# Patient Record
Sex: Male | Born: 1950 | Race: Black or African American | Hispanic: No | Marital: Single | Smoking: Former smoker
Health system: Southern US, Community
[De-identification: ages and names within clinical notes are randomized; demographics above are authoritative.]

---

## 2020-03-08 ENCOUNTER — Emergency Department (HOSPITAL_BASED_OUTPATIENT_CLINIC_OR_DEPARTMENT_OTHER): Payer: Medicare Other

## 2020-03-08 ENCOUNTER — Inpatient Hospital Stay (HOSPITAL_BASED_OUTPATIENT_CLINIC_OR_DEPARTMENT_OTHER)
Admission: EM | Admit: 2020-03-08 | Discharge: 2020-04-02 | DRG: 177 | Disposition: A | Discharge status: Skilled Nursing Facility | Payer: Medicare Other | Source: Other Acute Inpatient Hospital | Attending: Internal Medicine | Admitting: Internal Medicine

## 2020-03-08 ENCOUNTER — Encounter (HOSPITAL_BASED_OUTPATIENT_CLINIC_OR_DEPARTMENT_OTHER): Payer: Self-pay | Admitting: Emergency Medicine

## 2020-03-08 ENCOUNTER — Other Ambulatory Visit: Payer: Self-pay

## 2020-03-08 DIAGNOSIS — I951 Orthostatic hypotension: Secondary | ICD-10-CM | POA: Diagnosis not present

## 2020-03-08 DIAGNOSIS — D72829 Elevated white blood cell count, unspecified: Secondary | ICD-10-CM | POA: Diagnosis not present

## 2020-03-08 DIAGNOSIS — R748 Abnormal levels of other serum enzymes: Secondary | ICD-10-CM | POA: Diagnosis present

## 2020-03-08 DIAGNOSIS — Z6831 Body mass index (BMI) 31.0-31.9, adult: Secondary | ICD-10-CM

## 2020-03-08 DIAGNOSIS — E669 Obesity, unspecified: Secondary | ICD-10-CM | POA: Diagnosis present

## 2020-03-08 DIAGNOSIS — T380X5A Adverse effect of glucocorticoids and synthetic analogues, initial encounter: Secondary | ICD-10-CM | POA: Diagnosis not present

## 2020-03-08 DIAGNOSIS — Z87891 Personal history of nicotine dependence: Secondary | ICD-10-CM

## 2020-03-08 DIAGNOSIS — D62 Acute posthemorrhagic anemia: Secondary | ICD-10-CM | POA: Diagnosis not present

## 2020-03-08 DIAGNOSIS — R7989 Other specified abnormal findings of blood chemistry: Secondary | ICD-10-CM | POA: Diagnosis present

## 2020-03-08 DIAGNOSIS — J1282 Pneumonia due to coronavirus disease 2019: Secondary | ICD-10-CM

## 2020-03-08 DIAGNOSIS — R0602 Shortness of breath: Secondary | ICD-10-CM

## 2020-03-08 DIAGNOSIS — Z23 Encounter for immunization: Secondary | ICD-10-CM

## 2020-03-08 DIAGNOSIS — R1310 Dysphagia, unspecified: Secondary | ICD-10-CM | POA: Diagnosis not present

## 2020-03-08 DIAGNOSIS — U071 COVID-19: Secondary | ICD-10-CM | POA: Diagnosis not present

## 2020-03-08 DIAGNOSIS — E860 Dehydration: Secondary | ICD-10-CM | POA: Diagnosis present

## 2020-03-08 DIAGNOSIS — K921 Melena: Secondary | ICD-10-CM | POA: Diagnosis not present

## 2020-03-08 DIAGNOSIS — E875 Hyperkalemia: Secondary | ICD-10-CM | POA: Diagnosis not present

## 2020-03-08 DIAGNOSIS — E876 Hypokalemia: Secondary | ICD-10-CM | POA: Diagnosis not present

## 2020-03-08 DIAGNOSIS — Z9119 Patient's noncompliance with other medical treatment and regimen: Secondary | ICD-10-CM

## 2020-03-08 DIAGNOSIS — D75839 Thrombocytosis, unspecified: Secondary | ICD-10-CM | POA: Diagnosis present

## 2020-03-08 DIAGNOSIS — J9601 Acute respiratory failure with hypoxia: Secondary | ICD-10-CM

## 2020-03-08 DIAGNOSIS — R0902 Hypoxemia: Secondary | ICD-10-CM

## 2020-03-08 DIAGNOSIS — I712 Thoracic aortic aneurysm, without rupture: Secondary | ICD-10-CM | POA: Diagnosis present

## 2020-03-08 DIAGNOSIS — R197 Diarrhea, unspecified: Secondary | ICD-10-CM | POA: Diagnosis not present

## 2020-03-08 DIAGNOSIS — W19XXXA Unspecified fall, initial encounter: Secondary | ICD-10-CM

## 2020-03-08 LAB — COMPREHENSIVE METABOLIC PANEL
ALT: 75 U/L — ABNORMAL HIGH (ref 0–44)
AST: 55 U/L — ABNORMAL HIGH (ref 15–41)
Albumin: 2.1 g/dL — ABNORMAL LOW (ref 3.5–5.0)
Alkaline Phosphatase: 113 U/L (ref 38–126)
Anion gap: 12 (ref 5–15)
BUN: 12 mg/dL (ref 8–23)
CO2: 25 mmol/L (ref 22–32)
Calcium: 7.9 mg/dL — ABNORMAL LOW (ref 8.9–10.3)
Chloride: 96 mmol/L — ABNORMAL LOW (ref 98–111)
Creatinine, Ser: 1.03 mg/dL (ref 0.61–1.24)
GFR calc Af Amer: >60 mL/min (ref >60)
GFR calc non Af Amer: >60 mL/min (ref >60)
Glucose, Bld: 176 mg/dL — ABNORMAL HIGH (ref 70–99)
Potassium: 3.8 mmol/L (ref 3.5–5.1)
Sodium: 133 mmol/L — ABNORMAL LOW (ref 135–145)
Total Bilirubin: 1 mg/dL (ref 0.3–1.2)
Total Protein: 7.1 g/dL (ref 6.5–8.1)

## 2020-03-08 LAB — LACTIC ACID, PLASMA
Lactic Acid, Venous: 1.7 mmol/L (ref 0.5–1.9)
Lactic Acid, Venous: 1.4 mmol/L (ref 0.5–1.9)

## 2020-03-08 LAB — CBC WITH DIFFERENTIAL/PLATELET
Abs Immature Granulocytes: 0.53 10*3/uL — ABNORMAL HIGH (ref 0.00–0.07)
Basophils Absolute: 0.1 10*3/uL (ref 0.0–0.1)
Basophils Relative: 0 %
Eosinophils Absolute: 0.1 10*3/uL (ref 0.0–0.5)
Eosinophils Relative: 0 %
HCT: 36.3 % — ABNORMAL LOW (ref 39.0–52.0)
Hemoglobin: 12.2 g/dL — ABNORMAL LOW (ref 13.0–17.0)
Immature Granulocytes: 3 %
Lymphocytes Relative: 4 %
Lymphs Abs: 0.8 10*3/uL (ref 0.7–4.0)
MCH: 31.5 pg (ref 26.0–34.0)
MCHC: 33.6 g/dL (ref 30.0–36.0)
MCV: 93.8 fL (ref 80.0–100.0)
Monocytes Absolute: 1 10*3/uL (ref 0.1–1.0)
Monocytes Relative: 6 %
Neutro Abs: 15.7 10*3/uL — ABNORMAL HIGH (ref 1.7–7.7)
Neutrophils Relative %: 87 %
Platelets: 509 10*3/uL — ABNORMAL HIGH (ref 150–400)
RBC: 3.87 MIL/uL — ABNORMAL LOW (ref 4.22–5.81)
RDW: 13.5 % (ref 11.5–15.5)
WBC: 18.2 10*3/uL — ABNORMAL HIGH (ref 4.0–10.5)
nRBC: 0.2 % (ref 0.0–0.2)

## 2020-03-08 LAB — FERRITIN: Ferritin: 1305 ng/mL — ABNORMAL HIGH (ref 24–336)

## 2020-03-08 LAB — TROPONIN I (HIGH SENSITIVITY)
Troponin I (High Sensitivity): 7 ng/L (ref <18)
Troponin I (High Sensitivity): 8 ng/L (ref <18)

## 2020-03-08 LAB — RESPIRATORY PANEL BY RT PCR (FLU A&B, COVID)
Influenza A by PCR: NEGATIVE
Influenza B by PCR: NEGATIVE
SARS Coronavirus 2 by RT PCR: POSITIVE — AB

## 2020-03-08 LAB — D-DIMER, QUANTITATIVE: D-Dimer, Quant: 8.73 ug/mL-FEU — ABNORMAL HIGH (ref 0.00–0.50)

## 2020-03-08 LAB — TRIGLYCERIDES: Triglycerides: 139 mg/dL (ref <150)

## 2020-03-08 LAB — C-REACTIVE PROTEIN: CRP: 30.9 mg/dL — ABNORMAL HIGH (ref <1.0)

## 2020-03-08 LAB — PROCALCITONIN: Procalcitonin: 0.19 ng/mL

## 2020-03-08 LAB — LACTATE DEHYDROGENASE: LDH: 425 U/L — ABNORMAL HIGH (ref 98–192)

## 2020-03-08 LAB — BRAIN NATRIURETIC PEPTIDE: B Natriuretic Peptide: 50.1 pg/mL (ref 0.0–100.0)

## 2020-03-08 MED ORDER — IOHEXOL 350 MG/ML SOLN
100.0000 mL | Freq: Once | INTRAVENOUS | Status: AC | PRN
  Administered 2020-03-08: 100 mL via INTRAVENOUS

## 2020-03-08 MED ORDER — ALBUTEROL SULFATE HFA 108 (90 BASE) MCG/ACT IN AERS
2.0000 | INHALATION_SPRAY | RESPIRATORY_TRACT | Status: DC | PRN
  Administered 2020-03-12 – 2020-03-24 (×4): 2 via RESPIRATORY_TRACT
  Filled 2020-03-08: qty 6.7

## 2020-03-08 MED ORDER — METHYLPREDNISOLONE SODIUM SUCC 125 MG IJ SOLR
80.0000 mg | Freq: Two times a day (BID) | INTRAMUSCULAR | Status: DC
  Administered 2020-03-09 – 2020-03-18 (×18): 80 mg via INTRAVENOUS
  Filled 2020-03-08 (×18): qty 2

## 2020-03-08 MED ORDER — SODIUM CHLORIDE 0.9 % IV SOLN
1.0000 g | INTRAVENOUS | Status: AC
  Administered 2020-03-08 – 2020-03-12 (×5): 1 g via INTRAVENOUS
  Filled 2020-03-08 (×5): qty 10

## 2020-03-08 MED ORDER — SODIUM CHLORIDE 0.9 % IV SOLN
500.0000 mg | INTRAVENOUS | Status: AC
  Administered 2020-03-08 – 2020-03-12 (×4): 500 mg via INTRAVENOUS
  Filled 2020-03-08 (×5): qty 500

## 2020-03-08 MED ORDER — ENOXAPARIN SODIUM 60 MG/0.6ML ~~LOC~~ SOLN
50.0000 mg | Freq: Two times a day (BID) | SUBCUTANEOUS | Status: DC
  Administered 2020-03-08 – 2020-03-09 (×2): 50 mg via SUBCUTANEOUS
  Filled 2020-03-08 (×2): qty 0.6

## 2020-03-08 MED ORDER — BARICITINIB 2 MG PO TABS
4.0000 mg | ORAL_TABLET | Freq: Every day | ORAL | Status: AC
  Administered 2020-03-08 – 2020-03-21 (×14): 4 mg via ORAL
  Filled 2020-03-08 (×14): qty 2

## 2020-03-08 MED ORDER — METHYLPREDNISOLONE SODIUM SUCC 125 MG IJ SOLR
50.0000 mg | Freq: Two times a day (BID) | INTRAMUSCULAR | Status: DC
  Administered 2020-03-08: 50 mg via INTRAVENOUS
  Filled 2020-03-08: qty 2

## 2020-03-08 MED ORDER — SODIUM CHLORIDE 0.9 % IV SOLN
100.0000 mg | Freq: Once | INTRAVENOUS | Status: AC
  Administered 2020-03-08: 100 mg via INTRAVENOUS
  Filled 2020-03-08: qty 20

## 2020-03-08 MED ORDER — SODIUM CHLORIDE 0.9 % IV SOLN
100.0000 mg | Freq: Every day | INTRAVENOUS | Status: AC
  Administered 2020-03-09 – 2020-03-12 (×4): 100 mg via INTRAVENOUS
  Filled 2020-03-08 (×4): qty 20

## 2020-03-08 NOTE — ED Notes (Signed)
Pt on monitor 

## 2020-03-08 NOTE — ED Triage Notes (Signed)
Per EMS:  Sob x few days, cough, sore throat.  Pulse ox 56 on arrival, 98 on 2L.  Lungs clear.

## 2020-03-08 NOTE — ED Triage Notes (Signed)
Pt started having cold symptoms and decreased appetite 1 week ago.  Increased SOB for the past couple days.

## 2020-03-08 NOTE — ED Provider Notes (Signed)
  Physical Exam  BP 128/76   Pulse (!) 107   Temp 98.6 F (37 C) (Oral)   Resp (!) 24   Ht 5\' 11"  (1.803 m)   Wt 101.6 kg   SpO2 93%   BMI 31.24 kg/m   Physical Exam  ED Course/Procedures   Clinical Course as of Mar 09 1639  Wed Mar 08, 2020  1150 Reviewed cxr - obvious covid pna  DG Chest Port 1 View [RD]  1317 Will check cta  D-dimer, quantitative(!) [RD]  1419 Down to 5L [place]   [RD]    Clinical Course User Index [RD] Mar 10, 2020, MD    Procedures  MDM  Care assumed at 3 PM.  Patient has symptoms for a week and tested positive for Covid.  His D-dimer is very elevated at 8.  Signout pending CTA and admission.  Patient initially was on high flow nasal cannula and titrated down to 3 to 5 L with good oxygen saturations.  4:41 PM CT showed focal pneumonia as well as thoracic aneurysm with no obvious rupture.  I talked to Dr. Milagros Loll from hospitalist.  He recommend IV Solu-Medrol 80 mg twice daily.  Also recommend Lovenox 0.5 mg twice daily.  Patient will continue remdesivir as well.  Will observe closely.      Jarvis Newcomer, MD 03/08/20 [phone]

## 2020-03-08 NOTE — ED Provider Notes (Signed)
[facility] EMERGENCY DEPARTMENT Provider Note   CSN: [id number] Arrival date & time: 03/08/20  1046     History Chief Complaint  Patient presents with  . Shortness of Breath     Tyler Jackson is a 69 y.o. male.  Presented to ER with concern for shortness of breath.  Patient reports that he had cold-like symptoms for the past week, started with generalized fatigue, decreased appetite.  However the past couple days has noted steadily increasing shortness of breath.  Also having increased cough, sore throat.  Cough is nonproductive, nonbloody.  No associated chest pain.  Denies any chronic medical problems.  Denies smoking history.  Did not get Covid vaccine.  History limited due to acuity  HPI     History reviewed. No pertinent past medical history.  There are no problems to display for this patient.   No family history on file.  Social History   Tobacco Use  . Smoking status: Former Smoker    Quit date: 03/08/1969    Years since quitting: 51.0  . Smokeless tobacco: Never Used  Substance Use Topics  . Alcohol use: Never  . Drug use: Never    Home Medications Prior to Admission medications   Not on File    Allergies    Patient has no known allergies.  Review of Systems   Review of Systems  Constitutional: Positive for chills and fatigue. Negative for fever.  HENT: Negative for ear pain and sore throat.   Eyes: Negative for pain and visual disturbance.  Respiratory: Positive for cough and shortness of breath.   Cardiovascular: Negative for chest pain and palpitations.  Gastrointestinal: Negative for abdominal pain and vomiting.  Genitourinary: Negative for dysuria and hematuria.  Musculoskeletal: Positive for arthralgias and myalgias. Negative for back pain.  Skin: Negative for color change and rash.  Neurological: Negative for seizures and syncope.  All other systems reviewed and are negative.   Physical Exam Updated Vital Signs BP (!)  134/91   Pulse (!) 104   Temp 98.6 F (37 C) (Oral)   Resp (!) 25   Ht 5\' 11"  (1.803 m)   Wt 101.6 kg   SpO2 92%   BMI 31.24 kg/m   Physical Exam Vitals and nursing note reviewed.  Constitutional:      Appearance: He is well-developed.  HENT:     Head: Normocephalic and atraumatic.  Eyes:     Conjunctiva/sclera: Conjunctivae normal.  Cardiovascular:     Rate and Rhythm: Normal rate and regular rhythm.     Heart sounds: No murmur heard.   Pulmonary:     Comments: Mild tachypnea, not in distress, mild crackles at bases, speaks in full sentences  15L HFNC Abdominal:     Palpations: Abdomen is soft.     Tenderness: There is no abdominal tenderness.  Musculoskeletal:     Cervical back: Neck supple.  Skin:    General: Skin is warm and dry.  Neurological:     General: No focal deficit present.     Mental Status: He is alert.     ED Results / Procedures / Treatments   Labs (all labs ordered are listed, but only abnormal results are displayed) Labs Reviewed  RESPIRATORY PANEL BY RT PCR (FLU A&B, COVID) - Abnormal; Notable for the following components:      Result Value   SARS Coronavirus 2 by RT PCR POSITIVE (*)    All other components within normal limits  CBC WITH DIFFERENTIAL/PLATELET -  Abnormal; Notable for the following components:   WBC 18.2 (*)    RBC 3.87 (*)    Hemoglobin 12.2 (*)    HCT 36.3 (*)    Platelets 509 (*)    Neutro Abs 15.7 (*)    Abs Immature Granulocytes 0.53 (*)    All other components within normal limits  COMPREHENSIVE METABOLIC PANEL - Abnormal; Notable for the following components:   Sodium 133 (*)    Chloride 96 (*)    Glucose, Bld 176 (*)    Calcium 7.9 (*)    Albumin 2.1 (*)    AST 55 (*)    ALT 75 (*)    All other components within normal limits  D-DIMER, QUANTITATIVE (NOT AT [facility]) - Abnormal; Notable for the following components:   D-Dimer, Quant 8.73 (*)    All other components within normal limits  LACTATE DEHYDROGENASE -  Abnormal; Notable for the following components:   LDH 425 (*)    All other components within normal limits  FERRITIN - Abnormal; Notable for the following components:   Ferritin 1,305 (*)    All other components within normal limits  FIBRINOGEN - Abnormal; Notable for the following components:   Fibrinogen <60 (*)    All other components within normal limits  C-REACTIVE PROTEIN - Abnormal; Notable for the following components:   CRP 30.9 (*)    All other components within normal limits  CULTURE, BLOOD (ROUTINE X 2)  CULTURE, BLOOD (ROUTINE X 2)  LACTIC ACID, PLASMA  LACTIC ACID, PLASMA  PROCALCITONIN  TRIGLYCERIDES  BRAIN NATRIURETIC PEPTIDE  TROPONIN I (HIGH SENSITIVITY)  TROPONIN I (HIGH SENSITIVITY)    EKG EKG Interpretation  Date/Time:  Wednesday March 08 2020 11:03:01 EDT Ventricular Rate:  114 PR Interval:    QRS Duration: 78 QT Interval:  328 QTC Calculation: 452 R Axis:   10 Text Interpretation: Sinus tachycardia Confirmed by Marianna Fuss ([id number]) on 03/08/2020 11:49:05 AM   Radiology DG Chest Port 1 View  Result Date: 03/08/2020 CLINICAL DATA:  Chest pain, shortness of breath EXAM: PORTABLE CHEST 1 VIEW COMPARISON:  None. FINDINGS: Mild cardiomegaly. Extensive patchy airspace opacities bilaterally with a predominantly peripheral and basilar distribution. No large pleural fluid collection. No pneumothorax. IMPRESSION: Extensive patchy bilateral airspace opacities with a predominantly peripheral and basilar distribution. Findings suspicious for multifocal atypical/viral pneumonia. Electronically Signed   By: Duanne Guess D.O.   On: 03/08/2020 11:53    Procedures .Critical Care Performed by: Milagros Loll, MD Authorized by: Milagros Loll, MD   Critical care provider statement:    Critical care time (minutes):  45   Critical care was necessary to treat or prevent imminent or life-threatening deterioration of the following conditions:   Respiratory failure   Critical care was time spent personally by me on the following activities:  Discussions with consultants, evaluation of patient's response to treatment, examination of patient, ordering and performing treatments and interventions, ordering and review of laboratory studies, ordering and review of radiographic studies, pulse oximetry, re-evaluation of patient's condition, obtaining history from patient or surrogate and review of old charts   (including critical care time)  Medications Ordered in ED Medications  methylPREDNISolone sodium succinate (SOLU-MEDROL) 125 mg/2 mL injection 50 mg (50 mg Intravenous Given 03/08/20 1400)  remdesivir 100 mg in sodium chloride 0.9 % 100 mL IVPB (has no administration in time range)  iohexol (OMNIPAQUE) 350 MG/ML injection 100 mL (has no administration in time range)  remdesivir 100 mg in  sodium chloride 0.9 % 100 mL IVPB (0 mg Intravenous Stopped 03/08/20 1434)    Followed by  remdesivir 100 mg in sodium chloride 0.9 % 100 mL IVPB (0 mg Intravenous Stopped 03/08/20 1508)    ED Course  I have reviewed the triage vital signs and the nursing notes.  Pertinent labs & imaging results that were available during my care of the patient were reviewed by me and considered in my medical decision making (see chart for details).  Clinical Course as of Mar 09 1535  Wed Mar 08, 2020  1150 Reviewed cxr - obvious covid pna  DG Chest Port 1 View [RD]  1317 Will check cta  D-dimer, quantitative(!) [RD]  1419 Down to 5L [place]   [RD]    Clinical Course User Index [RD] Milagros Loll, MD   MDM Rules/Calculators/A&P                          69 year old male presenting to ER with 1 week of cold symptoms followed by shortness of breath.  Per EMS report, profound hypoxia on room air.  Initially on 15 L high flow nasal cannula titrated down to 5 L.  Covid positive.  CXR consistent with Covid pneumonia.  D-dimer significantly elevated, CTA chest ordered  to rule out pulmonary embolism.  Started steroids, remdesivir.  While awaiting CT, signed out to Y[facility]. Anticipate admission to [facility] or [facility] once CTA has been done.   Takeem Krotzer was evaluated in Emergency Department on 03/08/2020 for the symptoms described in the history of present illness. He was evaluated in the context of the global COVID-19 pandemic, which necessitated consideration that the patient might be at risk for infection with the SARS-CoV-2 virus that causes COVID-19. Institutional protocols and algorithms that pertain to the evaluation of patients at risk for COVID-19 are in a state of rapid change based on information released by regulatory bodies including the CDC and federal and state organizations. These policies and algorithms were followed during the patient's care in the ED.  Final Clinical Impression(s) / ED Diagnoses Final diagnoses:  SOB (shortness of breath)  COVID-19  Acute respiratory failure with hypoxia [facility])    Rx / DC Orders ED Discharge Orders    None       Milagros Loll, MD 03/08/20 1536

## 2020-03-08 NOTE — Plan of Care (Signed)
Brief note:   I was called to accept this patient in transfer to [facility] to be admitted for covid-19 pneumonia, spoke with Dr. Silverio Lay in detail. He is hypoxemic, initially requiring 15L but true requirement appears to be 3-5LPM. CTA chest personally reviewed, showing significant diffuse opacities predominantly peripheral and bibasilar without lobar consolidation or PE. CRP grossly elevated at 30, d-dimer about 8. PCT 0.19, WBC 18.2k w/neutrophilic predominance. LFTs mildly elevated, still a candidate for antiviral, tocilizumab.   - Accepted to PCU for admission to inpatient - With elevated d-dimer, recommend increasing lovenox to 0.5mg /kg q12h - With severity of inflammatory marker elevation, I have suspicion for cytokine storm, recommend augmenting solumedrol to 80mg  IV q12h. If hypoxia progresses, would have very low threshold to give tocilizumab 800mg  IV x1 or start baricitinib 4mg  daily (CrCl >38ml/min) under FDA EUA.  - Continue remdesivir - With neutrophilic leukocytosis and density of opacities on CXR, would empirically cover for CAP if PCT rises and/or if immunosuppressive medications as mentioned above are required.  - Would recheck CRP, d-dimer, PCT, CMP, and CBC w/diff in AM.  , MD 03/08/2020 4:50 PM

## 2020-03-09 ENCOUNTER — Encounter (HOSPITAL_COMMUNITY): Payer: Self-pay | Admitting: Internal Medicine

## 2020-03-09 DIAGNOSIS — R197 Diarrhea, unspecified: Secondary | ICD-10-CM | POA: Diagnosis not present

## 2020-03-09 DIAGNOSIS — D75839 Thrombocytosis, unspecified: Secondary | ICD-10-CM | POA: Diagnosis present

## 2020-03-09 DIAGNOSIS — R0602 Shortness of breath: Secondary | ICD-10-CM | POA: Diagnosis present

## 2020-03-09 DIAGNOSIS — I951 Orthostatic hypotension: Secondary | ICD-10-CM | POA: Diagnosis not present

## 2020-03-09 DIAGNOSIS — K921 Melena: Secondary | ICD-10-CM | POA: Diagnosis not present

## 2020-03-09 DIAGNOSIS — J9601 Acute respiratory failure with hypoxia: Secondary | ICD-10-CM

## 2020-03-09 DIAGNOSIS — U071 COVID-19: Secondary | ICD-10-CM | POA: Diagnosis present

## 2020-03-09 DIAGNOSIS — Z6831 Body mass index (BMI) 31.0-31.9, adult: Secondary | ICD-10-CM | POA: Diagnosis not present

## 2020-03-09 DIAGNOSIS — E669 Obesity, unspecified: Secondary | ICD-10-CM | POA: Diagnosis present

## 2020-03-09 DIAGNOSIS — I712 Thoracic aortic aneurysm, without rupture: Secondary | ICD-10-CM | POA: Diagnosis present

## 2020-03-09 DIAGNOSIS — T380X5A Adverse effect of glucocorticoids and synthetic analogues, initial encounter: Secondary | ICD-10-CM | POA: Diagnosis not present

## 2020-03-09 DIAGNOSIS — E860 Dehydration: Secondary | ICD-10-CM | POA: Diagnosis present

## 2020-03-09 DIAGNOSIS — Z87891 Personal history of nicotine dependence: Secondary | ICD-10-CM | POA: Diagnosis not present

## 2020-03-09 DIAGNOSIS — Z9119 Patient's noncompliance with other medical treatment and regimen: Secondary | ICD-10-CM | POA: Diagnosis not present

## 2020-03-09 DIAGNOSIS — D72829 Elevated white blood cell count, unspecified: Secondary | ICD-10-CM | POA: Diagnosis not present

## 2020-03-09 DIAGNOSIS — D62 Acute posthemorrhagic anemia: Secondary | ICD-10-CM | POA: Diagnosis not present

## 2020-03-09 DIAGNOSIS — E876 Hypokalemia: Secondary | ICD-10-CM | POA: Diagnosis not present

## 2020-03-09 DIAGNOSIS — R1310 Dysphagia, unspecified: Secondary | ICD-10-CM | POA: Diagnosis not present

## 2020-03-09 DIAGNOSIS — Z23 Encounter for immunization: Secondary | ICD-10-CM | POA: Diagnosis present

## 2020-03-09 DIAGNOSIS — E875 Hyperkalemia: Secondary | ICD-10-CM | POA: Diagnosis not present

## 2020-03-09 DIAGNOSIS — R748 Abnormal levels of other serum enzymes: Secondary | ICD-10-CM | POA: Diagnosis present

## 2020-03-09 DIAGNOSIS — R7989 Other specified abnormal findings of blood chemistry: Secondary | ICD-10-CM | POA: Diagnosis present

## 2020-03-09 DIAGNOSIS — J1282 Pneumonia due to coronavirus disease 2019: Secondary | ICD-10-CM | POA: Diagnosis present

## 2020-03-09 LAB — COMPREHENSIVE METABOLIC PANEL
ALT: 58 U/L — ABNORMAL HIGH (ref 0–44)
AST: 35 U/L (ref 15–41)
Albumin: 1.9 g/dL — ABNORMAL LOW (ref 3.5–5.0)
Alkaline Phosphatase: 103 U/L (ref 38–126)
Anion gap: 13 (ref 5–15)
BUN: 18 mg/dL (ref 8–23)
CO2: 23 mmol/L (ref 22–32)
Calcium: 7.9 mg/dL — ABNORMAL LOW (ref 8.9–10.3)
Chloride: 103 mmol/L (ref 98–111)
Creatinine, Ser: 0.98 mg/dL (ref 0.61–1.24)
GFR calc Af Amer: >60 mL/min (ref >60)
GFR calc non Af Amer: >60 mL/min (ref >60)
Glucose, Bld: 188 mg/dL — ABNORMAL HIGH (ref 70–99)
Potassium: 4.5 mmol/L (ref 3.5–5.1)
Sodium: 139 mmol/L (ref 135–145)
Total Bilirubin: 1 mg/dL (ref 0.3–1.2)
Total Protein: 7.1 g/dL (ref 6.5–8.1)

## 2020-03-09 LAB — CBC WITH DIFFERENTIAL/PLATELET
Abs Immature Granulocytes: 0.25 10*3/uL — ABNORMAL HIGH (ref 0.00–0.07)
Basophils Absolute: 0 10*3/uL (ref 0.0–0.1)
Basophils Relative: 0 %
Eosinophils Absolute: 0 10*3/uL (ref 0.0–0.5)
Eosinophils Relative: 0 %
HCT: 36.4 % — ABNORMAL LOW (ref 39.0–52.0)
Hemoglobin: 11.6 g/dL — ABNORMAL LOW (ref 13.0–17.0)
Immature Granulocytes: 2 %
Lymphocytes Relative: 4 %
Lymphs Abs: 0.7 10*3/uL (ref 0.7–4.0)
MCH: 30.8 pg (ref 26.0–34.0)
MCHC: 31.9 g/dL (ref 30.0–36.0)
MCV: 96.6 fL (ref 80.0–100.0)
Monocytes Absolute: 0.4 10*3/uL (ref 0.1–1.0)
Monocytes Relative: 2 %
Neutro Abs: 15.1 10*3/uL — ABNORMAL HIGH (ref 1.7–7.7)
Neutrophils Relative %: 92 %
Platelets: 552 10*3/uL — ABNORMAL HIGH (ref 150–400)
RBC: 3.77 MIL/uL — ABNORMAL LOW (ref 4.22–5.81)
RDW: 13.8 % (ref 11.5–15.5)
WBC: 16.5 10*3/uL — ABNORMAL HIGH (ref 4.0–10.5)
nRBC: 0 % (ref 0.0–0.2)

## 2020-03-09 LAB — D-DIMER, QUANTITATIVE: D-Dimer, Quant: 7.16 ug/mL-FEU — ABNORMAL HIGH (ref 0.00–0.50)

## 2020-03-09 LAB — LACTATE DEHYDROGENASE: LDH: 291 U/L — ABNORMAL HIGH (ref 98–192)

## 2020-03-09 LAB — FIBRINOGEN
Fibrinogen: <60 mg/dL — CL (ref 210–475)
Fibrinogen: >800 mg/dL — ABNORMAL HIGH (ref 210–475)

## 2020-03-09 LAB — PROCALCITONIN: Procalcitonin: 0.3 ng/mL

## 2020-03-09 LAB — C-REACTIVE PROTEIN: CRP: 17.8 mg/dL — ABNORMAL HIGH (ref <1.0)

## 2020-03-09 LAB — HIV ANTIBODY (ROUTINE TESTING W REFLEX): HIV Screen 4th Generation wRfx: NONREACTIVE

## 2020-03-09 LAB — HEPATITIS B SURFACE ANTIGEN: Hepatitis B Surface Ag: NONREACTIVE

## 2020-03-09 LAB — FERRITIN: Ferritin: 1390 ng/mL — ABNORMAL HIGH (ref 24–336)

## 2020-03-09 MED ORDER — ENOXAPARIN SODIUM 40 MG/0.4ML ~~LOC~~ SOLN: 40.0000 mg | SUBCUTANEOUS | Status: DC

## 2020-03-09 MED ORDER — ONDANSETRON HCL 4 MG PO TABS: 4.0000 mg | ORAL_TABLET | Freq: Four times a day (QID) | ORAL | Status: DC | PRN

## 2020-03-09 MED ORDER — ZINC SULFATE 220 (50 ZN) MG PO CAPS
220.0000 mg | ORAL_CAPSULE | Freq: Every day | ORAL | Status: DC
  Administered 2020-03-09 – 2020-04-02 (×24): 220 mg via ORAL
  Filled 2020-03-09 (×24): qty 1

## 2020-03-09 MED ORDER — ACETAMINOPHEN 325 MG PO TABS
650.0000 mg | ORAL_TABLET | Freq: Four times a day (QID) | ORAL | Status: DC | PRN
  Administered 2020-03-11 – 2020-04-02 (×9): 650 mg via ORAL
  Filled 2020-03-09 (×9): qty 2

## 2020-03-09 MED ORDER — ENOXAPARIN SODIUM 60 MG/0.6ML ~~LOC~~ SOLN
50.0000 mg | SUBCUTANEOUS | Status: DC
  Administered 2020-03-10 – 2020-03-15 (×6): 50 mg via SUBCUTANEOUS
  Filled 2020-03-09 (×6): qty 0.6

## 2020-03-09 MED ORDER — ONDANSETRON HCL 4 MG/2ML IJ SOLN: 4.0000 mg | Freq: Four times a day (QID) | INTRAMUSCULAR | Status: DC | PRN

## 2020-03-09 MED ORDER — ASCORBIC ACID 500 MG PO TABS
500.0000 mg | ORAL_TABLET | Freq: Every day | ORAL | Status: DC
  Administered 2020-03-09 – 2020-04-02 (×24): 500 mg via ORAL
  Filled 2020-03-09 (×24): qty 1

## 2020-03-09 MED ORDER — PNEUMOCOCCAL VAC POLYVALENT 25 MCG/0.5ML IJ INJ
0.5000 mL | INJECTION | INTRAMUSCULAR | Status: AC
  Administered 2020-03-10: 0.5 mL via INTRAMUSCULAR
  Filled 2020-03-09: qty 0.5

## 2020-03-09 NOTE — ED Notes (Signed)
Patient SAT dropped to the low 80's with movement trying to get out of bed. Placed on 15L [place] HF to achieve sat of 90-91%. Stated he feels better, no more SOB

## 2020-03-09 NOTE — Progress Notes (Signed)
Removed patient from HFNC setup and placed patient on a 3 liter nasal cannula with a humidity bottle.  Patient's SPO2 remains between 93% and 96%.  RT will continue to monitor.

## 2020-03-09 NOTE — Progress Notes (Signed)
Tyler Jackson is a 69 y.o. male patient admitted from ED awake, alert - oriented  X 4 - no acute distress noted.  VSS - Blood pressure 140/78, pulse 99, temperature 98.5 F (36.9 C), temperature source Oral, resp. rate (!) 21, height 5\' 11"  (1.803 m), weight 101.6 kg, SpO2 93 %.    IV in place, occlusive dsg intact without redness.  Orientation to room, and floor completed with information packet given to patient/family.  Patient declined safety video at this time.  Admission INP armband ID verified with patient/family, and in place.   SR up x 2, fall assessment complete, with patient and family able to verbalize understanding of risk associated with falls, and verbalized understanding to call nsg before up out of bed.  Call light within reach, patient able to voice, and demonstrate understanding.  Skin, clean-dry- intact without evidence of bruising, or skin tears.   No evidence of skin break down noted on exam.     Will cont to eval and treat per MD orders.  , RN 03/09/2020 6:29 PM

## 2020-03-09 NOTE — H&P (Signed)
History and Physical    Tyler Jackson WER:[id number] DOB: [birth year] DOA: 03/08/2020  PCP: Patient, No Pcp Per  Patient coming from: [facility]  I have personally briefly reviewed patient's old medical records in [facility]  Chief Complaint: Shortness of breath  HPI: Tyler Jackson is a 69 y.o. male with no significant past medical history presents to emergency department with worsening shortness of breath and cough since 1 week.  Patient reports worsening shortness of breath, sore throat, cough, generalized weakness, no energy, body ache decreased appetite since 1 week however his symptoms started getting worse since past couple of days. He is not vaccinated against COVID-19.  He denies headache, blurry vision, chest pain, wheezing, leg swelling, palpitation, orthopnea, PND, fever, chills, nausea, vomiting, diarrhea, urinary symptoms.  No history of smoking, illicit drug use however drinks alcohol occasionally. He does not have any past medical history and does not take any medications at home.  ED Course: Upon arrival to ED: Patient tachycardic, tachypneic, hypoxemic requiring high flow nasal cannula-15 L, afebrile with leukocytosis of 18.2, platelet: 509, lactic acid: WNL, troponin x2 -, BMP shows sodium of 133, AST: 55, ALT: 75, D-dimer: 8.7, CRP: 30.9, PCT: 0.19, elevated all other inflammatory markers. COVID-19 positive. CT angio of chest came back negative for PE but shows: Extensive bilateral and predominantly. Afebrile and basilar region of patchy groundglass attenuation airspace opacity with more consolidative changes in lower lobes-consistent with atypical/viral multifocal pneumonia such as COVID-19. 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 Patient received Rocephin, azithromycin, Solu-Medrol, remdesivir and baricitinib at [facility] and transferred to [facility] for further evaluation and  management of acute hypoxemic respiratory failure secondary to COVID-19 pneumonia. Review of Systems: As per HPI otherwise negative.    History reviewed. No pertinent past medical history.  History reviewed. No pertinent surgical history.   reports that he quit smoking about 51 years ago. He has never used smokeless tobacco. He reports that he does not drink alcohol and does not use drugs.  No Known Allergies  History reviewed. No pertinent family history.  Prior to Admission medications   Not on File    Physical Exam: Vitals:   03/09/20 1200 03/09/20 1230 03/09/20 1302 03/09/20 1354  BP: (!) 111/98 116/74 116/74 127/86  Pulse: (!) 104 (!) 102 86 (!) 103  Resp: 19 (!) 21  (!) 24  Temp:    99.1 F (37.3 C)  TempSrc:    Oral  SpO2:  96% 96% 90%  Weight:      Height:        Constitutional: NAD, calm, comfortable, on high flow nasal cannula-on 15 L, communicating well, Eyes: PERRL, lids and conjunctivae normal ENMT: Mucous membranes are moist. Posterior pharynx clear of any exudate or lesions.Normal dentition.  Neck: normal, supple, no masses, no thyromegaly Respiratory: clear to auscultation bilaterally, no wheezing, no crackles. Normal respiratory effort. No accessory muscle use.  Cardiovascular: Regular rate and rhythm, no murmurs / rubs / gallops. No extremity edema. 2+ pedal pulses. No carotid bruits.  Abdomen: no tenderness, no masses palpated. No hepatosplenomegaly. Bowel sounds positive.  Musculoskeletal: no clubbing / cyanosis. No joint deformity upper and lower extremities. Good ROM, no contractures. Normal muscle tone.  Skin: no rashes, lesions, ulcers. No induration Neurologic: CN 2-12 grossly intact. Sensation intact, DTR normal. Strength 5/5 in all 4.  Psychiatric: Normal judgment and insight. Alert and oriented x 3. Normal mood.  Labs on Admission: I have personally reviewed following labs and imaging studies  CBC: Recent Labs  Lab 03/08/20 1115  WBC  18.2*  NEUTROABS 15.7*  HGB 12.2*  HCT 36.3*  MCV 93.8  PLT 509*   Basic Metabolic Panel: Recent Labs  Lab 03/08/20 1115  NA 133*  K 3.8  CL 96*  CO2 25  GLUCOSE 176*  BUN 12  CREATININE 1.03  CALCIUM 7.9*   GFR: Estimated Creatinine Clearance: 84.5 mL/min (by C-G formula based on SCr of 1.03 mg/dL). Liver Function Tests: Recent Labs  Lab 03/08/20 1115  AST 55*  ALT 75*  ALKPHOS 113  BILITOT 1.0  PROT 7.1  ALBUMIN 2.1*   No results for input(s): LIPASE, AMYLASE in the last 168 hours. No results for input(s): AMMONIA in the last 168 hours. Coagulation Profile: No results for input(s): INR, PROTIME in the last 168 hours. Cardiac Enzymes: No results for input(s): CKTOTAL, CKMB, CKMBINDEX, TROPONINI in the last 168 hours. BNP (last 3 results) No results for input(s): PROBNP in the last 8760 hours. HbA1C: No results for input(s): HGBA1C in the last 72 hours. CBG: No results for input(s): GLUCAP in the last 168 hours. Lipid Profile: Recent Labs    03/08/20 1115  TRIG 139   Thyroid Function Tests: No results for input(s): TSH, T4TOTAL, FREET4, T3FREE, THYROIDAB in the last 72 hours. Anemia Panel: Recent Labs    03/08/20 1115  FERRITIN 1,305*   Urine analysis: No results found for: COLORURINE, APPEARANCEUR, LABSPEC, PHURINE, GLUCOSEU, HGBUR, BILIRUBINUR, KETONESUR, PROTEINUR, UROBILINOGEN, NITRITE, LEUKOCYTESUR  Radiological Exams on Admission: CT Angio Chest PE W and/or Wo Contrast  Result Date: 03/08/2020 CLINICAL DATA:  Shortness of breath, cold like symptoms for the past week, chest pain, hypoxia. Concern for COVID and pulmonary embolus. EXAM: CT ANGIOGRAPHY CHEST WITH CONTRAST TECHNIQUE: Multidetector CT imaging of the chest was performed using the standard protocol during bolus administration of intravenous contrast. Multiplanar CT image reconstructions and MIPs were obtained to evaluate the vascular anatomy. CONTRAST:  OMNIPAQUE IOHEXOL 350 MG/ML  SOLN COMPARISON:  None. FINDINGS: Cardiovascular: Satisfactory opacification of the pulmonary arteries to the segmental level. No evidence of pulmonary embolism. Normal heart size. No pericardial effusion. Mild fusiform dilation of the tubular portion of the ascending thoracic aorta with a maximal transverse diameter of 4.4 cm. Calcifications visualized along the coronary arteries. No pericardial effusion. Mediastinum/Nodes: No enlarged mediastinal, hilar, or axillary lymph nodes. Thyroid gland, trachea, and esophagus demonstrate no significant findings. Lungs/Pleura: Predominantly peripheral geographic regions of ground-glass attenuation airspace opacity with more confluent opacities in the bilateral lower lobes. Peripheral opacities are present in all lobes of both lungs. No interlobular septal thickening to suggest pulmonary edema. No pneumothorax or pleural effusion. Upper Abdomen: No acute abnormality. Musculoskeletal: No acute fracture or aggressive appearing lytic or blastic osseous lesion. Review of the MIP images confirms the above findings. IMPRESSION: 1. Negative for acute pulmonary embolus. 2. Extensive bilateral and predominantly peripheral and basilar regions of patchy ground-glass attenuation airspace opacity with more consolidative changes in the lower lobes. Overall, findings are most consistent with acute atypical/viral multifocal pneumonia such as COVID pneumonia. 3. Fusiform aneurysmal dilation of the ascending thoracic aorta with a maximal transverse diameter of 4.4 cm. Recommend annual imaging followup by CTA or MRA. This recommendation follows 2010 ACCF/AHA/AATS/ACR/ASA/SCA/SCAI/SIR/STS/SVM Guidelines for the Diagnosis and Management of Patients with Thoracic Aortic Disease. Circulation. 2010; 121: B510-C585. Aortic aneurysm NOS (ICD10-I71.9); Aortic Atherosclerosis (ICD10-I70.0). 4. Coronary artery calcifications. Electronically Signed  By: Malachy Moan M.D.   On: 03/08/2020 16:11    DG Chest Port 1 View  Result Date: 03/08/2020 CLINICAL DATA:  Chest pain, shortness of breath EXAM: PORTABLE CHEST 1 VIEW COMPARISON:  None. FINDINGS: Mild cardiomegaly. Extensive patchy airspace opacities bilaterally with a predominantly peripheral and basilar distribution. No large pleural fluid collection. No pneumothorax. IMPRESSION: Extensive patchy bilateral airspace opacities with a predominantly peripheral and basilar distribution. Findings suspicious for multifocal atypical/viral pneumonia. Electronically Signed   By: Duanne Guess D.O.   On: 03/08/2020 11:53    EKG: Independently reviewed.  Sinus tachycardia.  No ST elevation or depression noted. Assessment/Plan Principal Problem:   Acute hypoxemic respiratory failure due to COVID-19 [facility]) Active Problems:   Thrombocytosis   Elevated liver enzymes    Acute hypoxemic respiratory failure due to COVID-19 pneumonia: -Patient presented with tachycardia, tachypnea, hypoxia requiring 15 l via high flow nasal cannula.  Patient is afebrile with leukocytosis of 18.2, lactic acid: WNL. Troponin x2 -. Reviewed chest x-ray and CT angio of chest-negative for PE. -Patient started on remdesivir, Solu-Medrol, baricitinib, Rocephin and azithromycin at [facility].  Considering elevated CRP of 30.9-continue Rocephin and azithromycin. -Continue Solu-Medrol, baricitinib and remdesivir. -Monitor vitals closely.  On continuous pulse ox.  We will try to wean off of oxygen as tolerated. -Repeat inflammatory markers today.  Start on p.o. vitamins.  Thrombocytosis: Likely reactive due to COVID-19 infection.  Platelet: 509.  Repeat CBC  Elevated liver enzymes: Likely in the setting of COVID-19 infection -AST: 55, ALT: 75. -Repeat CMP today.  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 Noted on CT angio of chest. Recommended annual imaging.  Obesity with BMI of 31. -Encourage exercise and weight  loss.  DVT prophylaxis: Lovenox/SCD Code Status: Full code  family Communication:  None present at bedside.  Plan of care discussed with patient in length and he verbalized understanding and agreed with it. Disposition Plan: Home in 3 to 4 days Consults called: None Admission status: Inpatient   Ollen Bowl MD [facility]  If 7PM-7AM, please contact night-coverage [email or web address] Password [email or web address]  03/09/2020, 2:21 PM

## 2020-03-10 DIAGNOSIS — J1282 Pneumonia due to coronavirus disease 2019: Secondary | ICD-10-CM

## 2020-03-10 DIAGNOSIS — R748 Abnormal levels of other serum enzymes: Secondary | ICD-10-CM

## 2020-03-10 DIAGNOSIS — D75839 Thrombocytosis, unspecified: Secondary | ICD-10-CM

## 2020-03-10 LAB — COMPREHENSIVE METABOLIC PANEL
ALT: 56 U/L — ABNORMAL HIGH (ref 0–44)
AST: 34 U/L (ref 15–41)
Albumin: 2.1 g/dL — ABNORMAL LOW (ref 3.5–5.0)
Alkaline Phosphatase: 98 U/L (ref 38–126)
Anion gap: 12 (ref 5–15)
BUN: 18 mg/dL (ref 8–23)
CO2: 24 mmol/L (ref 22–32)
Calcium: 8.3 mg/dL — ABNORMAL LOW (ref 8.9–10.3)
Chloride: 102 mmol/L (ref 98–111)
Creatinine, Ser: 0.98 mg/dL (ref 0.61–1.24)
GFR calc Af Amer: >60 mL/min (ref >60)
GFR calc non Af Amer: >60 mL/min (ref >60)
Glucose, Bld: 135 mg/dL — ABNORMAL HIGH (ref 70–99)
Potassium: 4.4 mmol/L (ref 3.5–5.1)
Sodium: 138 mmol/L (ref 135–145)
Total Bilirubin: 0.8 mg/dL (ref 0.3–1.2)
Total Protein: 7.3 g/dL (ref 6.5–8.1)

## 2020-03-10 LAB — C-REACTIVE PROTEIN: CRP: 15.6 mg/dL — ABNORMAL HIGH (ref <1.0)

## 2020-03-10 LAB — CBC WITH DIFFERENTIAL/PLATELET
Abs Immature Granulocytes: 0.19 10*3/uL — ABNORMAL HIGH (ref 0.00–0.07)
Basophils Absolute: 0.1 10*3/uL (ref 0.0–0.1)
Basophils Relative: 0 %
Eosinophils Absolute: 0 10*3/uL (ref 0.0–0.5)
Eosinophils Relative: 0 %
HCT: 39.2 % (ref 39.0–52.0)
Hemoglobin: 12.5 g/dL — ABNORMAL LOW (ref 13.0–17.0)
Immature Granulocytes: 1 %
Lymphocytes Relative: 4 %
Lymphs Abs: 0.7 10*3/uL (ref 0.7–4.0)
MCH: 30.7 pg (ref 26.0–34.0)
MCHC: 31.9 g/dL (ref 30.0–36.0)
MCV: 96.3 fL (ref 80.0–100.0)
Monocytes Absolute: 0.5 10*3/uL (ref 0.1–1.0)
Monocytes Relative: 3 %
Neutro Abs: 18.3 10*3/uL — ABNORMAL HIGH (ref 1.7–7.7)
Neutrophils Relative %: 92 %
Platelets: 584 10*3/uL — ABNORMAL HIGH (ref 150–400)
RBC: 4.07 MIL/uL — ABNORMAL LOW (ref 4.22–5.81)
RDW: 14 % (ref 11.5–15.5)
WBC: 19.7 10*3/uL — ABNORMAL HIGH (ref 4.0–10.5)
nRBC: 0 % (ref 0.0–0.2)

## 2020-03-10 LAB — D-DIMER, QUANTITATIVE: D-Dimer, Quant: 6.74 ug/mL-FEU — ABNORMAL HIGH (ref 0.00–0.50)

## 2020-03-10 LAB — PHOSPHORUS: Phosphorus: 3.3 mg/dL (ref 2.5–4.6)

## 2020-03-10 LAB — FERRITIN: Ferritin: 1312 ng/mL — ABNORMAL HIGH (ref 24–336)

## 2020-03-10 LAB — MAGNESIUM: Magnesium: 2.6 mg/dL — ABNORMAL HIGH (ref 1.7–2.4)

## 2020-03-10 MED ORDER — GUAIFENESIN ER 600 MG PO TB12
600.0000 mg | ORAL_TABLET | Freq: Two times a day (BID) | ORAL | Status: DC
  Administered 2020-03-10 – 2020-04-02 (×46): 600 mg via ORAL
  Filled 2020-03-10 (×46): qty 1

## 2020-03-10 MED ORDER — GUAIFENESIN-DM 100-10 MG/5ML PO SYRP
5.0000 mL | ORAL_SOLUTION | ORAL | Status: DC | PRN
  Administered 2020-03-10 – 2020-03-30 (×4): 5 mL via ORAL
  Filled 2020-03-10 (×5): qty 5

## 2020-03-10 MED ORDER — MOMETASONE FURO-FORMOTEROL FUM 200-5 MCG/ACT IN AERO
2.0000 | INHALATION_SPRAY | Freq: Two times a day (BID) | RESPIRATORY_TRACT | Status: DC
  Administered 2020-03-10 – 2020-03-12 (×4): 2 via RESPIRATORY_TRACT
  Filled 2020-03-10: qty 8.8

## 2020-03-10 MED ORDER — FUROSEMIDE 10 MG/ML IJ SOLN
40.0000 mg | Freq: Once | INTRAMUSCULAR | Status: AC
  Administered 2020-03-10: 40 mg via INTRAVENOUS
  Filled 2020-03-10: qty 4

## 2020-03-10 MED ORDER — FAMOTIDINE 20 MG PO TABS
20.0000 mg | ORAL_TABLET | Freq: Every day | ORAL | Status: DC
  Administered 2020-03-10 – 2020-03-27 (×18): 20 mg via ORAL
  Filled 2020-03-10 (×18): qty 1

## 2020-03-10 NOTE — Progress Notes (Addendum)
PROGRESS NOTE  Tyler Jackson ZOX:[id number] DOB: 01-21-1951 DOA: 03/08/2020  PCP: Patient, No Pcp Per  Brief History/Interval Summary: 69 y.o. male with no significant past medical history presented to emergency department with worsening shortness of breath and cough since 1 week. He is not vaccinated against COVID-19.  He was noted to be tachycardic tachypneic and hypoxemic.  Requiring high flow nasal cannula.  COVID-19 test came back positive.  CT angiogram was negative for PE but showed extensive bilateral opacities.  Patient was hospitalized for further management.   Reason for Visit: Acute respiratory failure with hypoxia.  Pneumonia due to COVID-19  Consultants: None  Procedures: None  Antibiotics: Anti-infectives (From admission, onward)   Start     Dose/Rate Route Frequency Ordered Stop   03/09/20 1000  remdesivir 100 mg in sodium chloride 0.9 % 100 mL IVPB        100 mg 200 mL/hr over 30 Minutes Intravenous Daily 03/08/20 1338 03/13/20 0959   03/08/20 1645  cefTRIAXone (ROCEPHIN) 1 g in sodium chloride 0.9 % 100 mL IVPB        1 g 200 mL/hr over 30 Minutes Intravenous Every 24 hours 03/08/20 1640     03/08/20 1645  azithromycin (ZITHROMAX) 500 mg in sodium chloride 0.9 % 250 mL IVPB        500 mg 250 mL/hr over 60 Minutes Intravenous Every 24 hours 03/08/20 1640     03/08/20 1430  remdesivir 100 mg in sodium chloride 0.9 % 100 mL IVPB       "Followed by" Linked Group Details   100 mg 200 mL/hr over 30 Minutes Intravenous  Once 03/08/20 1338 03/08/20 1508   03/08/20 1400  remdesivir 100 mg in sodium chloride 0.9 % 100 mL IVPB       "Followed by" Linked Group Details   100 mg 200 mL/hr over 30 Minutes Intravenous  Once 03/08/20 1338 03/08/20 1434      Subjective/Interval History: Patient mentions that he is feeling slightly better compared to yesterday.  Still gets short of breath with minimal exertion.  Denies any chest pain.  No nausea or  vomiting.   Assessment/Plan:  Acute Hypoxic Resp. Failure/Pneumonia due to COVID-19  Recent Labs  Lab 03/08/20 1115 03/09/20 1911 03/10/20 0750  DDIMER 8.73* 7.16* 6.74*  FERRITIN 1,305* 1,390* 1,312*  CRP 30.9* 17.8* 15.6*  ALT 75* 58* 56*  PROCALCITON 0.19 0.30  --     Objective findings: Fever: Afebrile Oxygen requirements: Noted to be saturating in the mid 90s on high flow nasal cannula 15 L plus nonrebreather  COVID 19 Therapeutics: Antibacterials: None.  Procalcitonin was 0.30. Remdesivir: Day 3 Steroids: Solu-Medrol Diuretics: Lasix x 1 today Inhaled Steroids: We will initiate inhaled steroids Baricitinib: Initiated on 9/29 PUD Prophylaxis: Initiate Pepcid DVT Prophylaxis:  Lovenox 50 mg daily  From a respiratory standpoint patient remains tenuous.  He is currently on high flow nasal cannula plus nonrebreather.  However he is noted to be saturating in the mid 90s.  Should be able to wean him down today.  Patient's D-dimer noted to be elevated but trending down.  CT angiogram was negative.  Will do lower extremity Doppler studies as well.  CRP was high as well at 30.9.  Improving.  Symptomatically is getting better.  Continue incentive spirometry, mobilization, prone positioning.  Continue Remdesivir steroids. After discussions of risks and benefits patient was started on baricitinib.  Continue for now.  The treatment plan and use of medications and known  side effects were discussed with patient. Some of the medications used are based on case reports/anecdotal data.  All other medications being used in the management of COVID-19 based on limited study data.  Complete risks and long-term side effects are unknown, however in the best clinical judgment they seem to be of some benefit.  Patient wanted to proceed with treatment options provided.  Thrombocytosis Likely reactive due to COVID-19.  Leukocytosis Secondary to steroids.  Transaminitis Secondary to COVID-19.   Seems to be better today.  Normocytic anemia Stable.  No evidence of overt bleeding.  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 Incidentally noted on CT angiogram.  Annual imaging is recommended.    Obesity Estimated body mass index is 31.24 kg/m as calculated from the following:   Height as of this encounter: 5\' 11"  (1.803 m).   Weight as of this encounter: 101.6 kg.   DVT Prophylaxis: Lovenox Code Status: Full code Family Communication: Discussed with the patient Disposition Plan: Hopefully return home when improved  Status is: Inpatient  Remains inpatient appropriate because:IV treatments appropriate due to intensity of illness or inability to take PO and Inpatient level of care appropriate due to severity of illness   Dispo: The patient is from: Home              Anticipated d/c is to: Home              Anticipated d/c date is: > 3 days              Patient currently is not medically stable to d/c.      Medications:  Scheduled: . vitamin C  500 mg Oral Daily  . baricitinib  4 mg Oral Daily  . enoxaparin (LOVENOX) injection  50 mg Subcutaneous Q24H  . methylPREDNISolone (SOLU-MEDROL) injection  80 mg Intravenous Q12H  . zinc sulfate  220 mg Oral Daily   Continuous: . azithromycin 500 mg (03/09/20 1651)  . cefTRIAXone (ROCEPHIN)  IV 1 g (03/09/20 1517)  . remdesivir 100 mg in NS 100 mL 100 mg (03/10/20 1004)   05/10/20, albuterol, guaiFENesin-dextromethorphan, ondansetron **OR** ondansetron (ZOFRAN) IV   Objective:  Vital Signs  Vitals:   03/10/20 0400 03/10/20 0525 03/10/20 0727 03/10/20 1144  BP: 117/80 122/86 130/82 126/88  Pulse: 96 99 (!) 102 99  Resp: (!) 23 16 20 20   Temp:  98.6 F (37 C) 98.1 F (36.7 C) 97.8 F (36.6 C)  TempSrc:  Oral Oral Axillary  SpO2: 92% 91% 90% 100%  Weight:      Height:        Intake/Output Summary (Last 24 hours) at 03/10/2020 1155 Last data filed at 03/10/2020  0437 Gross per 24 hour  Intake 119.91 ml  Output 2400 ml  Net -2280.09 ml   Filed Weights   03/08/20 1111 03/09/20 1006  Weight: 101.6 kg 101.6 kg    General appearance: Awake alert.  In no distress Resp: Tachypnea noted.  No use of accessory muscles.  Coarse breath sounds with crackles bilateral bases.  No wheezing or rhonchi. Cardio: S1-S2 is normal regular.  No S3-S4.  No rubs murmurs or bruit GI: Abdomen is soft.  Nontender nondistended.  Bowel sounds are present normal.  No masses organomegaly Extremities: Mild edema bilateral lower extremities. Neurologic: Alert and oriented x3.  No focal neurological deficits.    Lab Results:  Data Reviewed: I have personally reviewed following labs and imaging studies  CBC: Recent  Labs  Lab 03/08/20 1115 03/09/20 1911 03/10/20 0750  WBC 18.2* 16.5* 19.7*  NEUTROABS 15.7* 15.1* 18.3*  HGB 12.2* 11.6* 12.5*  HCT 36.3* 36.4* 39.2  MCV 93.8 96.6 96.3  PLT 509* 552* 584*    Basic Metabolic Panel: Recent Labs  Lab 03/08/20 1115 03/09/20 1911 03/10/20 0750  NA 133* 139 138  K 3.8 4.5 4.4  CL 96* 103 102  CO2 25 23 24   GLUCOSE 176* 188* 135*  BUN 12 18 18   CREATININE 1.03 0.98 0.98  CALCIUM 7.9* 7.9* 8.3*  MG  --   --  2.6*  PHOS  --   --  3.3    GFR: Estimated Creatinine Clearance: 88.8 mL/min (by C-G formula based on SCr of 0.98 mg/dL).  Liver Function Tests: Recent Labs  Lab 03/08/20 1115 03/09/20 1911 03/10/20 0750  AST 55* 35 34  ALT 75* 58* 56*  ALKPHOS 113 103 98  BILITOT 1.0 1.0 0.8  PROT 7.1 7.1 7.3  ALBUMIN 2.1* 1.9* 2.1*    Lipid Profile: Recent Labs    03/08/20 1115  TRIG 139    Anemia Panel: Recent Labs    03/09/20 1911 03/10/20 0750  FERRITIN 1,390* 1,312*    Recent Results (from the past 240 hour(s))  Blood Culture (routine x 2)     Status: None (Preliminary result)   Collection Time: 03/08/20 11:15 AM   Specimen: BLOOD  Result Value Ref Range Status   Specimen Description    Final    BLOOD LEFT ANTECUBITAL Performed at [facility], [address], [place], [address] [place]    Special Requests   Final    BOTTLES DRAWN AEROBIC AND ANAEROBIC Blood Culture adequate volume Performed at [facility], [address] [place], [address] [place]    Culture   Final    NO GROWTH 2 DAYS Performed at [facility], [address] [place], [address] [place]    Report Status PENDING  Incomplete  Respiratory Panel by RT PCR (Flu A&B, Covid) - Nasopharyngeal Swab     Status: Abnormal   Collection Time: 03/08/20 11:24 AM   Specimen: Nasopharyngeal Swab  Result Value Ref Range Status   SARS Coronavirus 2 by RT PCR POSITIVE (A) NEGATIVE Final    Comment: RESULT CALLED TO, READ BACK BY AND VERIFIED WITH: MARVA SIMMS RN @1311  03/08/2020 OLSONM (NOTE) SARS-CoV-2 target nucleic acids are DETECTED.  SARS-CoV-2 RNA is generally detectable in upper respiratory specimens  during the acute phase of infection. Positive results are indicative of the presence of the identified virus, but do not rule out bacterial infection or co-infection with other pathogens not detected by the test. Clinical correlation with patient history and other diagnostic information is necessary to determine patient infection status. The expected result is Negative.  Fact Sheet for Patients:  03/10/20  Fact Sheet for Healthcare Providers:  This test is not yet approved or cleared by the 03/10/2020 FDA and  has been authorized for detection and/or diagnosis of SARS-CoV-2 by FDA under an Emergency Use Authorization (EUA).  This EUA will remain in effect (meaning this test can  be used) for the duration of  the COVID-19 declaration under Section 564(b)(1) of the Act, 21 U.S.C. section 360bbb-3(b)(1), unless the authorization is terminated or revoked sooner.      Influenza A by PCR NEGATIVE NEGATIVE  Final   Influenza B by PCR NEGATIVE NEGATIVE Final    Comment: (NOTE) The  Xpert Xpress SARS-CoV-2/FLU/RSV assay is intended as an aid in  the diagnosis of influenza from Nasopharyngeal swab specimens and  should not be used as a sole basis for treatment. Nasal washings and  aspirates are unacceptable for Xpert Xpress SARS-CoV-2/FLU/RSV  testing.  Fact Sheet for Patients: https://www.moore.com/  Fact Sheet for Healthcare Providers: https://www.young.biz/  This test is not yet approved or cleared by the [place] FDA and  has been authorized for detection and/or diagnosis of SARS-CoV-2 by  FDA under an Emergency Use Authorization (EUA). This EUA will remain  in effect (meaning this test can be used) for the duration of the  Covid-19 declaration under Section 564(b)(1) of the Act, 21  U.S.C. section 360bbb-3(b)(1), unless the authorization is  terminated or revoked. Performed at [facility], [address] [place], [place] [address]   Blood Culture (routine x 2)     Status: None (Preliminary result)   Collection Time: 03/08/20 11:25 AM   Specimen: BLOOD LEFT HAND  Result Value Ref Range Status   Specimen Description   Final    BLOOD LEFT HAND Performed at [facility], [address] [place], [place] [address]    Special Requests   Final    BOTTLES DRAWN AEROBIC AND ANAEROBIC Blood Culture adequate volume Performed at [facility], [address] [place], [place] [address]    Culture   Final    NO GROWTH 2 DAYS Performed at [facility], [address] [place], [place] [address]    Report Status PENDING  Incomplete      Radiology Studies: CT Angio Chest PE W and/or Wo Contrast  Result Date: 03/08/2020 CLINICAL DATA:  Shortness of breath, cold like symptoms for the past week, chest pain, hypoxia. Concern for COVID and pulmonary embolus. EXAM: CT ANGIOGRAPHY CHEST WITH CONTRAST TECHNIQUE:  Multidetector CT imaging of the chest was performed using the standard protocol during bolus administration of intravenous contrast. Multiplanar CT image reconstructions and MIPs were obtained to evaluate the vascular anatomy. CONTRAST:  OMNIPAQUE IOHEXOL 350 MG/ML SOLN COMPARISON:  None. FINDINGS: Cardiovascular: Satisfactory opacification of the pulmonary arteries to the segmental level. No evidence of pulmonary embolism. Normal heart size. No pericardial effusion. Mild fusiform dilation of the tubular portion of the ascending thoracic aorta with a maximal transverse diameter of 4.4 cm. Calcifications visualized along the coronary arteries. No pericardial effusion. Mediastinum/Nodes: No enlarged mediastinal, hilar, or axillary lymph nodes. Thyroid gland, trachea, and esophagus demonstrate no significant findings. Lungs/Pleura: Predominantly peripheral geographic regions of ground-glass attenuation airspace opacity with more confluent opacities in the bilateral lower lobes. Peripheral opacities are present in all lobes of both lungs. No interlobular septal thickening to suggest pulmonary edema. No pneumothorax or pleural effusion. Upper Abdomen: No acute abnormality. Musculoskeletal: No acute fracture or aggressive appearing lytic or blastic osseous lesion. Review of the MIP images confirms the above findings. IMPRESSION: 1. Negative for acute pulmonary embolus. 2. Extensive bilateral and predominantly peripheral and basilar regions of patchy ground-glass attenuation airspace opacity with more consolidative changes in the lower lobes. Overall, findings are most consistent with acute atypical/viral multifocal pneumonia such as COVID pneumonia. 3. Fusiform aneurysmal dilation of the ascending thoracic aorta with a maximal transverse diameter of 4.4 cm. Recommend annual imaging followup by CTA or MRA. This recommendation follows 2010 ACCF/AHA/AATS/ACR/ASA/SCA/SCAI/SIR/STS/SVM Guidelines for the Diagnosis and  Management of Patients with Thoracic Aortic Disease. Circulation. 2010; 121: W888-B169. Aortic aneurysm NOS (ICD10-I71.9); Aortic  Atherosclerosis (ICD10-I70.0). 4. Coronary artery calcifications. Electronically Signed   By: Malachy MoanHeath  McCullough M.D.   On: 03/08/2020 16:11       LOS: 1 day   Shawnte Demarest  [facility] Pager on [email or web address]  03/10/2020, 11:55 AM

## 2020-03-11 ENCOUNTER — Encounter (HOSPITAL_COMMUNITY): Payer: Self-pay

## 2020-03-11 LAB — COMPREHENSIVE METABOLIC PANEL
ALT: 47 U/L — ABNORMAL HIGH (ref 0–44)
AST: 32 U/L (ref 15–41)
Albumin: 1.9 g/dL — ABNORMAL LOW (ref 3.5–5.0)
Alkaline Phosphatase: 84 U/L (ref 38–126)
Anion gap: 10 (ref 5–15)
BUN: 19 mg/dL (ref 8–23)
CO2: 24 mmol/L (ref 22–32)
Calcium: 8.3 mg/dL — ABNORMAL LOW (ref 8.9–10.3)
Chloride: 103 mmol/L (ref 98–111)
Creatinine, Ser: 1 mg/dL (ref 0.61–1.24)
GFR calc Af Amer: >60 mL/min (ref >60)
GFR calc non Af Amer: >60 mL/min (ref >60)
Glucose, Bld: 132 mg/dL — ABNORMAL HIGH (ref 70–99)
Potassium: 4.2 mmol/L (ref 3.5–5.1)
Sodium: 137 mmol/L (ref 135–145)
Total Bilirubin: 1.1 mg/dL (ref 0.3–1.2)
Total Protein: 7 g/dL (ref 6.5–8.1)

## 2020-03-11 LAB — CBC WITH DIFFERENTIAL/PLATELET
Abs Immature Granulocytes: 0.16 10*3/uL — ABNORMAL HIGH (ref 0.00–0.07)
Basophils Absolute: 0 10*3/uL (ref 0.0–0.1)
Basophils Relative: 0 %
Eosinophils Absolute: 0 10*3/uL (ref 0.0–0.5)
Eosinophils Relative: 0 %
HCT: 35.4 % — ABNORMAL LOW (ref 39.0–52.0)
Hemoglobin: 11.2 g/dL — ABNORMAL LOW (ref 13.0–17.0)
Immature Granulocytes: 1 %
Lymphocytes Relative: 8 %
Lymphs Abs: 1.2 10*3/uL (ref 0.7–4.0)
MCH: 30.4 pg (ref 26.0–34.0)
MCHC: 31.6 g/dL (ref 30.0–36.0)
MCV: 95.9 fL (ref 80.0–100.0)
Monocytes Absolute: 0.8 10*3/uL (ref 0.1–1.0)
Monocytes Relative: 6 %
Neutro Abs: 12.2 10*3/uL — ABNORMAL HIGH (ref 1.7–7.7)
Neutrophils Relative %: 85 %
Platelets: 554 10*3/uL — ABNORMAL HIGH (ref 150–400)
RBC: 3.69 MIL/uL — ABNORMAL LOW (ref 4.22–5.81)
RDW: 13.8 % (ref 11.5–15.5)
WBC: 14.5 10*3/uL — ABNORMAL HIGH (ref 4.0–10.5)
nRBC: 0 % (ref 0.0–0.2)

## 2020-03-11 LAB — GLUCOSE, CAPILLARY: Glucose-Capillary: 178 mg/dL — ABNORMAL HIGH (ref 70–99)

## 2020-03-11 LAB — MAGNESIUM: Magnesium: 2.4 mg/dL (ref 1.7–2.4)

## 2020-03-11 LAB — D-DIMER, QUANTITATIVE: D-Dimer, Quant: 3.64 ug/mL-FEU — ABNORMAL HIGH (ref 0.00–0.50)

## 2020-03-11 LAB — C-REACTIVE PROTEIN: CRP: 9.6 mg/dL — ABNORMAL HIGH (ref <1.0)

## 2020-03-11 MED ORDER — FUROSEMIDE 10 MG/ML IJ SOLN
40.0000 mg | Freq: Once | INTRAMUSCULAR | Status: AC
  Administered 2020-03-11: 40 mg via INTRAVENOUS
  Filled 2020-03-11: qty 4

## 2020-03-11 NOTE — Progress Notes (Addendum)
Pt MEWS score yellow around midnight due to respitory rate in high 20s. Around 1900 patient was on 15L HFNC with NRB and sats were 100%. Oxygen levels have slowly decreased to 85-88% while resting and 75-80% when moving in bed. Repositioned patient to left side and oxygen levels are now 92-95% resting. Will continue to monitor.

## 2020-03-11 NOTE — Progress Notes (Signed)
   03/10/20 2359  Assess: MEWS Score  Temp 98 F (36.7 C)  BP 115/84  Pulse Rate 85  ECG Heart Rate 87  Resp (!) 29  Level of Consciousness Alert  SpO2 100 %  O2 Device HFNC;Non-rebreather Mask  Patient Activity (if Appropriate) In bed  O2 Flow Rate (L/min) 15 L/min  Assess: MEWS Score  MEWS Temp 0  MEWS Systolic 0  MEWS Pulse 0  MEWS RR 2  MEWS LOC 0  MEWS Score 2  MEWS Score Color Yellow  Assess: if the MEWS score is Yellow or Red  Were vital signs taken at a resting state? Yes  Focused Assessment No change from prior assessment  Early Detection of Sepsis Score *See Row Information* Medium  MEWS guidelines implemented *See Row Information* Yes  Treat  MEWS Interventions Other (Comment) (reposition patient to left side)  Pain Scale 0-10  Pain Score 0  Take Vital Signs  Increase Vital Sign Frequency  Yellow: Q 2hr X 2 then Q 4hr X 2, if remains yellow, continue Q 4hrs  Escalate  MEWS: Escalate Yellow: discuss with charge nurse/RN and consider discussing with provider and RRT  Notify: Charge Nurse/RN  Name of Charge Nurse/RN Notified Tania Ade RN  Date Charge Nurse/RN Notified 03/11/20  Time Charge Nurse/RN Notified 0300

## 2020-03-11 NOTE — Progress Notes (Signed)
PROGRESS NOTE  Tyler HazyLarry Jackson ZOX:[id number] DOB: 06/16/1950 DOA: 03/08/2020  PCP: Patient, No Pcp Per  Brief History/Interval Summary: 69 y.o. male with no significant past medical history presented to emergency department with worsening shortness of breath and cough since 1 week. He is not vaccinated against COVID-19.  He was noted to be tachycardic tachypneic and hypoxemic.  Requiring high flow nasal cannula.  COVID-19 test came back positive.  CT angiogram was negative for PE but showed extensive bilateral opacities.  Patient was hospitalized for further management.   Reason for Visit: Acute respiratory failure with hypoxia.  Pneumonia due to COVID-19  Consultants: None  Procedures: None  Antibiotics: Anti-infectives (From admission, onward)   Start     Dose/Rate Route Frequency Ordered Stop   03/09/20 1000  remdesivir 100 mg in sodium chloride 0.9 % 100 mL IVPB        100 mg 200 mL/hr over 30 Minutes Intravenous Daily 03/08/20 1338 03/13/20 0959   03/08/20 1645  cefTRIAXone (ROCEPHIN) 1 g in sodium chloride 0.9 % 100 mL IVPB        1 g 200 mL/hr over 30 Minutes Intravenous Every 24 hours 03/08/20 1640     03/08/20 1645  azithromycin (ZITHROMAX) 500 mg in sodium chloride 0.9 % 250 mL IVPB        500 mg 250 mL/hr over 60 Minutes Intravenous Every 24 hours 03/08/20 1640     03/08/20 1430  remdesivir 100 mg in sodium chloride 0.9 % 100 mL IVPB       "Followed by" Linked Group Details   100 mg 200 mL/hr over 30 Minutes Intravenous  Once 03/08/20 1338 03/08/20 1508   03/08/20 1400  remdesivir 100 mg in sodium chloride 0.9 % 100 mL IVPB       "Followed by" Linked Group Details   100 mg 200 mL/hr over 30 Minutes Intravenous  Once 03/08/20 1338 03/08/20 1434      Subjective/Interval History: Patient mentions that he is feeling better.  Less short of breath today compared to yesterday.  Slept well.  Denies any chest pain.  Seems to be slightly anxious.      Assessment/Plan:  Acute Hypoxic Resp. Failure/Pneumonia due to COVID-19  Recent Labs  Lab 03/08/20 1115 03/09/20 1911 03/10/20 0750 03/11/20 0500  DDIMER 8.73* 7.16* 6.74* 3.64*  FERRITIN 1,305* 1,390* 1,312*  --   CRP 30.9* 17.8* 15.6* 9.6*  ALT 75* 58* 56* 47*  PROCALCITON 0.19 0.30  --   --     Objective findings: Oxygen requirements: Noted to be on HFNC as well as nonrebreather saturating in the late 90s.  Patient was taken off of the nonrebreather.  He was monitored for about 15-20 minutes.  Saturations remained in the early 90s.  RN was notified of this change.    COVID 19 Therapeutics: Antibacterials: On ceftriaxone and azithromycin.  Procalcitonin was 0.30. Remdesivir: Day 4 Steroids: Solu-Medrol Diuretics: Lasix was given yesterday.  We will repeat another dose today. Inhaled Steroids: Continue Dulera Baricitinib: Initiated on 9/29 PUD Prophylaxis: Pepcid DVT Prophylaxis:  Lovenox 50 mg daily  From a respiratory standpoint patient seems to be improving.  Still noted to be on HFNC as well as nonrebreather this morning.  Hopefully he will require only the high flow nasal cannula today onwards.  Continue Remdesivir steroids and baricitinib.  Patient noted to be on antibacterials.  His procalcitonin was 0.3.  WBC was noted to be elevated.  We will plan for a 5-day course.  D-dimer noted to be improving.  CRP is also getting better.  CT angiogram was negative.  Lower extremity Doppler studies pending.    Continue incentive spirometry, mobilization, prone positioning.  The treatment plan and use of medications and known side effects were discussed with patient. Some of the medications used are based on case reports/anecdotal data.  All other medications being used in the management of COVID-19 based on limited study data.  Complete risks and long-term side effects are unknown, however in the best clinical judgment they seem to be of some benefit.  Patient wanted to  proceed with treatment options provided.  Thrombocytosis Likely reactive due to COVID-19.  Leukocytosis Secondary to steroids.  Transaminitis Secondary to COVID-19.  Improving.  Normocytic anemia Stable.  No evidence of overt bleeding.  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 Incidentally noted on CT angiogram.  Annual imaging is recommended.    Obesity Estimated body mass index is 31.24 kg/m as calculated from the following:   Height as of this encounter: 5\' 11"  (1.803 m).   Weight as of this encounter: 101.6 kg.   DVT Prophylaxis: Lovenox Code Status: Full code Family Communication: Discussed with the patient Disposition Plan: Hopefully return home when improved  Status is: Inpatient  Remains inpatient appropriate because:IV treatments appropriate due to intensity of illness or inability to take PO and Inpatient level of care appropriate due to severity of illness   Dispo: The patient is from: Home              Anticipated d/c is to: Home              Anticipated d/c date is: > 3 days              Patient currently is not medically stable to d/c.      Medications:  Scheduled: . vitamin C  500 mg Oral Daily  . baricitinib  4 mg Oral Daily  . enoxaparin (LOVENOX) injection  50 mg Subcutaneous Q24H  . famotidine  20 mg Oral Daily  . guaiFENesin  600 mg Oral BID  . methylPREDNISolone (SOLU-MEDROL) injection  80 mg Intravenous Q12H  . mometasone-formoterol  2 puff Inhalation BID  . zinc sulfate  220 mg Oral Daily   Continuous: . azithromycin 500 mg (03/09/20 1651)  . cefTRIAXone (ROCEPHIN)  IV 1 g (03/10/20 1526)  . remdesivir 100 mg in NS 100 mL 100 mg (03/11/20 0907)   05/11/20, albuterol, guaiFENesin-dextromethorphan, ondansetron **OR** ondansetron (ZOFRAN) IV   Objective:  Vital Signs  Vitals:   03/11/20 0305 03/11/20 0401 03/11/20 0733 03/11/20 1146  BP:  113/62 128/79 114/70  Pulse:  94 99 (!)  104  Resp:  (!) 28 (!) 23 (!) 24  Temp: 98.4 F (36.9 C) 98.1 F (36.7 C) 98.4 F (36.9 C) 98.5 F (36.9 C)  TempSrc: Oral Axillary Axillary Oral  SpO2:  93% 94% 92%  Weight:      Height:        Intake/Output Summary (Last 24 hours) at 03/11/2020 1155 Last data filed at 03/11/2020 0630 Gross per 24 hour  Intake --  Output 526 ml  Net -526 ml   Filed Weights   03/08/20 1111 03/09/20 1006  Weight: 101.6 kg 101.6 kg    General appearance: Awake alert.  In no distress Resp: Less tachypneic today.  Coarse breath sounds with crackles bilateral bases.  No wheezing or rhonchi. Cardio: S1-S2 is normal regular.  No  S3-S4.  No rubs murmurs or bruit GI: Abdomen is soft.  Nontender nondistended.  Bowel sounds are present normal.  No masses organomegaly Extremities: Mild edema bilateral lower extremities. Neurologic: Alert and oriented x3.  No focal neurological deficits.     Lab Results:  Data Reviewed: I have personally reviewed following labs and imaging studies  CBC: Recent Labs  Lab 03/08/20 1115 03/09/20 1911 03/10/20 0750 03/11/20 0500  WBC 18.2* 16.5* 19.7* 14.5*  NEUTROABS 15.7* 15.1* 18.3* 12.2*  HGB 12.2* 11.6* 12.5* 11.2*  HCT 36.3* 36.4* 39.2 35.4*  MCV 93.8 96.6 96.3 95.9  PLT 509* 552* 584* 554*    Basic Metabolic Panel: Recent Labs  Lab 03/08/20 1115 03/09/20 1911 03/10/20 0750 03/11/20 0500  NA 133* 139 138 137  K 3.8 4.5 4.4 4.2  CL 96* 103 102 103  CO2 25 23 24 24   GLUCOSE 176* 188* 135* 132*  BUN 12 18 18 19   CREATININE 1.03 0.98 0.98 1.00  CALCIUM 7.9* 7.9* 8.3* 8.3*  MG  --   --  2.6* 2.4  PHOS  --   --  3.3  --     GFR: Estimated Creatinine Clearance: 87 mL/min (by C-G formula based on SCr of 1 mg/dL).  Liver Function Tests: Recent Labs  Lab 03/08/20 1115 03/09/20 1911 03/10/20 0750 03/11/20 0500  AST 55* 35 34 32  ALT 75* 58* 56* 47*  ALKPHOS 113 103 98 84  BILITOT 1.0 1.0 0.8 1.1  PROT 7.1 7.1 7.3 7.0  ALBUMIN 2.1* 1.9*  2.1* 1.9*    Anemia Panel: Recent Labs    03/09/20 1911 03/10/20 0750  FERRITIN 1,390* 1,312*    Recent Results (from the past 240 hour(s))  Blood Culture (routine x 2)     Status: None (Preliminary result)   Collection Time: 03/08/20 11:15 AM   Specimen: BLOOD  Result Value Ref Range Status   Specimen Description   Final    BLOOD LEFT ANTECUBITAL Performed at [facility], [address], [place], [address] [place]    Special Requests   Final    BOTTLES DRAWN AEROBIC AND ANAEROBIC Blood Culture adequate volume Performed at [facility], [address] [place], [address] [place]    Culture   Final    NO GROWTH 3 DAYS Performed at [facility], [address] [place], [address] [place]    Report Status PENDING  Incomplete  Respiratory Panel by RT PCR (Flu A&B, Covid) - Nasopharyngeal Swab     Status: Abnormal   Collection Time: 03/08/20 11:24 AM   Specimen: Nasopharyngeal Swab  Result Value Ref Range Status   SARS Coronavirus 2 by RT PCR POSITIVE (A) NEGATIVE Final    Comment: RESULT CALLED TO, READ BACK BY AND VERIFIED WITH: MARVA SIMMS RN @1311  03/08/2020 OLSONM (NOTE) SARS-CoV-2 target nucleic acids are DETECTED.  SARS-CoV-2 RNA is generally detectable in upper respiratory specimens  during the acute phase of infection. Positive results are indicative of the presence of the identified virus, but do not rule out bacterial infection or co-infection with other pathogens not detected by the test. Clinical correlation with patient history and other diagnostic information is necessary to determine patient infection status. The expected result is Negative.  Fact Sheet for Patients:  03/10/20  Fact Sheet for Healthcare Providers:  This test is not yet approved or cleared by the 03/10/2020 FDA and  has been authorized for detection and/or diagnosis of SARS-CoV-2  by FDA under an Emergency Use Authorization (EUA).  This EUA will remain in effect (meaning this test can  be used) for the duration of  the COVID-19 declaration under Section 564(b)(1) of the Act, 21 U.S.C. section 360bbb-3(b)(1), unless the authorization is terminated or revoked sooner.      Influenza A by PCR NEGATIVE NEGATIVE Final   Influenza B by PCR NEGATIVE NEGATIVE Final    Comment: (NOTE) The Xpert Xpress SARS-CoV-2/FLU/RSV assay is intended as an aid in  the diagnosis of influenza from Nasopharyngeal swab specimens and  should not be used as a sole basis for treatment. Nasal washings and  aspirates are unacceptable for Xpert Xpress SARS-CoV-2/FLU/RSV  testing.  Fact Sheet for Patients: https://www.moore.com/  Fact Sheet for Healthcare Providers: https://www.young.biz/  This test is not yet approved or cleared by the [place] FDA and  has been authorized for detection and/or diagnosis of SARS-CoV-2 by  FDA under an Emergency Use Authorization (EUA). This EUA will remain  in effect (meaning this test can be used) for the duration of the  Covid-19 declaration under Section 564(b)(1) of the Act, 21  U.S.C. section 360bbb-3(b)(1), unless the authorization is  terminated or revoked. Performed at [facility], [address] [place], [place] [address]   Blood Culture (routine x 2)     Status: None (Preliminary result)   Collection Time: 03/08/20 11:25 AM   Specimen: BLOOD LEFT HAND  Result Value Ref Range Status   Specimen Description   Final    BLOOD LEFT HAND Performed at [facility], [address] [place], [place] [address]    Special Requests   Final    BOTTLES DRAWN AEROBIC AND ANAEROBIC Blood Culture adequate volume Performed at [facility], [address] [place], [place] [address]    Culture   Final    NO GROWTH 3 DAYS Performed at [facility], [address]  [place], [place] [address]    Report Status PENDING  Incomplete      Radiology Studies: No results found.     LOS: 2 days   Tyler Jackson Tyler Jackson  [facility] Pager on [email or web address]  03/11/2020, 11:55 AM

## 2020-03-12 ENCOUNTER — Inpatient Hospital Stay (HOSPITAL_COMMUNITY): Payer: Medicare Other

## 2020-03-12 DIAGNOSIS — R7989 Other specified abnormal findings of blood chemistry: Secondary | ICD-10-CM

## 2020-03-12 DIAGNOSIS — M7989 Other specified soft tissue disorders: Secondary | ICD-10-CM

## 2020-03-12 LAB — CBC WITH DIFFERENTIAL/PLATELET
Abs Immature Granulocytes: 0.12 10*3/uL — ABNORMAL HIGH (ref 0.00–0.07)
Basophils Absolute: 0 10*3/uL (ref 0.0–0.1)
Basophils Relative: 0 %
Eosinophils Absolute: 0 10*3/uL (ref 0.0–0.5)
Eosinophils Relative: 0 %
HCT: 39.2 % (ref 39.0–52.0)
Hemoglobin: 12.5 g/dL — ABNORMAL LOW (ref 13.0–17.0)
Immature Granulocytes: 1 %
Lymphocytes Relative: 7 %
Lymphs Abs: 1.1 10*3/uL (ref 0.7–4.0)
MCH: 30.9 pg (ref 26.0–34.0)
MCHC: 31.9 g/dL (ref 30.0–36.0)
MCV: 96.8 fL (ref 80.0–100.0)
Monocytes Absolute: 0.9 10*3/uL (ref 0.1–1.0)
Monocytes Relative: 6 %
Neutro Abs: 13.3 10*3/uL — ABNORMAL HIGH (ref 1.7–7.7)
Neutrophils Relative %: 86 %
Platelets: 631 10*3/uL — ABNORMAL HIGH (ref 150–400)
RBC: 4.05 MIL/uL — ABNORMAL LOW (ref 4.22–5.81)
RDW: 13.7 % (ref 11.5–15.5)
WBC: 15.5 10*3/uL — ABNORMAL HIGH (ref 4.0–10.5)
nRBC: 0 % (ref 0.0–0.2)

## 2020-03-12 LAB — COMPREHENSIVE METABOLIC PANEL
ALT: 49 U/L — ABNORMAL HIGH (ref 0–44)
AST: 29 U/L (ref 15–41)
Albumin: 2.2 g/dL — ABNORMAL LOW (ref 3.5–5.0)
Alkaline Phosphatase: 84 U/L (ref 38–126)
Anion gap: 9 (ref 5–15)
BUN: 18 mg/dL (ref 8–23)
CO2: 27 mmol/L (ref 22–32)
Calcium: 8.5 mg/dL — ABNORMAL LOW (ref 8.9–10.3)
Chloride: 101 mmol/L (ref 98–111)
Creatinine, Ser: 1.01 mg/dL (ref 0.61–1.24)
GFR calc Af Amer: >60 mL/min (ref >60)
GFR calc non Af Amer: >60 mL/min (ref >60)
Glucose, Bld: 130 mg/dL — ABNORMAL HIGH (ref 70–99)
Potassium: 4.3 mmol/L (ref 3.5–5.1)
Sodium: 137 mmol/L (ref 135–145)
Total Bilirubin: 0.7 mg/dL (ref 0.3–1.2)
Total Protein: 7.7 g/dL (ref 6.5–8.1)

## 2020-03-12 LAB — MRSA PCR SCREENING: MRSA by PCR: NEGATIVE

## 2020-03-12 LAB — C-REACTIVE PROTEIN: CRP: 6.9 mg/dL — ABNORMAL HIGH (ref <1.0)

## 2020-03-12 LAB — D-DIMER, QUANTITATIVE: D-Dimer, Quant: 3.28 ug/mL-FEU — ABNORMAL HIGH (ref 0.00–0.50)

## 2020-03-12 LAB — MAGNESIUM: Magnesium: 2.4 mg/dL (ref 1.7–2.4)

## 2020-03-12 MED ORDER — FUROSEMIDE 10 MG/ML IJ SOLN
40.0000 mg | Freq: Once | INTRAMUSCULAR | Status: AC
  Administered 2020-03-12: 40 mg via INTRAVENOUS
  Filled 2020-03-12: qty 4

## 2020-03-12 NOTE — Progress Notes (Addendum)
PROGRESS NOTE  Tyler Jackson ATF:[id number] DOB: 25-Apr-1951 DOA: 03/08/2020  PCP: Patient, No Pcp Per  Brief History/Interval Summary: 69 y.o. male with no significant past medical history presented to emergency department with worsening shortness of breath and cough since 1 week. He is not vaccinated against COVID-19.  He was noted to be tachycardic tachypneic and hypoxemic.  Requiring high flow nasal cannula.  COVID-19 test came back positive.  CT angiogram was negative for PE but showed extensive bilateral opacities.  Patient was hospitalized for further management.   Reason for Visit: Acute respiratory failure with hypoxia.  Pneumonia due to COVID-19  Consultants: None  Procedures: None  Antibiotics: Anti-infectives (From admission, onward)   Start     Dose/Rate Route Frequency Ordered Stop   03/09/20 1000  remdesivir 100 mg in sodium chloride 0.9 % 100 mL IVPB        100 mg 200 mL/hr over 30 Minutes Intravenous Daily 03/08/20 1338 03/12/20 0946   03/08/20 1645  cefTRIAXone (ROCEPHIN) 1 g in sodium chloride 0.9 % 100 mL IVPB        1 g 200 mL/hr over 30 Minutes Intravenous Every 24 hours 03/08/20 1640 03/13/20 1644   03/08/20 1645  azithromycin (ZITHROMAX) 500 mg in sodium chloride 0.9 % 250 mL IVPB        500 mg 250 mL/hr over 60 Minutes Intravenous Every 24 hours 03/08/20 1640 03/13/20 1644   03/08/20 1430  remdesivir 100 mg in sodium chloride 0.9 % 100 mL IVPB       "Followed by" Linked Group Details   100 mg 200 mL/hr over 30 Minutes Intravenous  Once 03/08/20 1338 03/08/20 1508   03/08/20 1400  remdesivir 100 mg in sodium chloride 0.9 % 100 mL IVPB       "Followed by" Linked Group Details   100 mg 200 mL/hr over 30 Minutes Intravenous  Once 03/08/20 1338 03/08/20 1434      Subjective/Interval History: Patient noted to be anxious and agitated this morning.  His saturations dropped into the 60s and he almost had a syncopal episode while he was at the bedside commode.   He was placed on a nonrebreather along with his high flow nasal cannula.  Saturations improved into the 80s.  Patient started feeling better.  He was then transferred back to the bed.  Complains of shortness of breath.  No chest pain.      Assessment/Plan:  Acute Hypoxic Resp. Failure/Pneumonia due to COVID-19  Recent Labs  Lab 03/08/20 1115 03/09/20 1911 03/10/20 0750 03/11/20 0500 03/12/20 0441  DDIMER 8.73* 7.16* 6.74* 3.64* 3.28*  FERRITIN 1,305* 1,390* 1,312*  --   --   CRP 30.9* 17.8* 15.6* 9.6* 6.9*  ALT 75* 58* 56* 47* 49*  PROCALCITON 0.19 0.30  --   --   --     Objective findings: Oxygen requirements: Currently on HFNC along with nonrebreather.  Saturating in the 90s.    COVID 19 Therapeutics: Antibacterials: On ceftriaxone and azithromycin.  Procalcitonin was 0.30. Remdesivir: Day 5 Steroids: Solu-Medrol Diuretics: Lasix given daily for last 2 days.  We will repeat today. Baricitinib: Initiated on 9/29 PUD Prophylaxis: Pepcid DVT Prophylaxis:  Lovenox 50 mg daily  From a respiratory standpoint patient seems to be tenuous.  Requiring high flow nasal cannula as well as nonrebreather at times.  We will repeat chest x-ray.  Continue Remdesivir, steroids and baricitinib.  5-day course of antibacterials.  D-dimer is seems to be improving.  CRP is  also getting better.  Continue to monitor closely. CT angiogram was negative for PE.  Lower extremity Doppler studies pending.    Chest x-ray did not show any changes.  Patient does not want the inhaled steroids.  He states that it is causing him to be very anxious.  We will stop Dulera.  Continue incentive spirometry, mobilization, prone positioning.  The treatment plan and use of medications and known side effects were discussed with patient. Some of the medications used are based on case reports/anecdotal data.  All other medications being used in the management of COVID-19 based on limited study data.  Complete risks and  long-term side effects are unknown, however in the best clinical judgment they seem to be of some benefit.  Patient wanted to proceed with treatment options provided.  Thrombocytosis Likely reactive due to COVID-19.  Leukocytosis Secondary to steroids.  Transaminitis Secondary to COVID-19.  Improving.  Normocytic anemia Stable.  No evidence of overt bleeding.  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 Incidentally noted on CT angiogram.  Annual imaging is recommended.    Obesity Estimated body mass index is 31.24 kg/m as calculated from the following:   Height as of this encounter: 5\' 11"  (1.803 m).   Weight as of this encounter: 101.6 kg.   DVT Prophylaxis: Lovenox Code Status: Full code Family Communication: Discussed with the patient Disposition Plan: Hopefully return home when improved  Status is: Inpatient  Remains inpatient appropriate because:IV treatments appropriate due to intensity of illness or inability to take PO and Inpatient level of care appropriate due to severity of illness   Dispo: The patient is from: Home              Anticipated d/c is to: Home              Anticipated d/c date is: > 3 days              Patient currently is not medically stable to d/c.      Medications:  Scheduled: . vitamin C  500 mg Oral Daily  . baricitinib  4 mg Oral Daily  . enoxaparin (LOVENOX) injection  50 mg Subcutaneous Q24H  . famotidine  20 mg Oral Daily  . guaiFENesin  600 mg Oral BID  . methylPREDNISolone (SOLU-MEDROL) injection  80 mg Intravenous Q12H  . zinc sulfate  220 mg Oral Daily   Continuous: . azithromycin 500 mg (03/11/20 1805)  . cefTRIAXone (ROCEPHIN)  IV 1 g (03/11/20 1651)   05/11/20, albuterol, guaiFENesin-dextromethorphan, ondansetron **OR** ondansetron (ZOFRAN) IV   Objective:  Vital Signs  Vitals:   03/11/20 2154 03/12/20 0004 03/12/20 0341 03/12/20 0739  BP:  124/77 114/68 120/81    Pulse:  97 97 100  Resp:  (!) 25 20 20   Temp:  98.7 F (37.1 C) 98.7 F (37.1 C) 98.4 F (36.9 C)  TempSrc:  Axillary Axillary Oral  SpO2: 95% 96% 97% 90%  Weight:      Height:        Intake/Output Summary (Last 24 hours) at 03/12/2020 1145 Last data filed at 03/12/2020 0843 Gross per 24 hour  Intake 480 ml  Output 450 ml  Net 30 ml   Filed Weights   03/08/20 1111 03/09/20 1006  Weight: 101.6 kg 101.6 kg    General appearance: Awake alert.  Anxious. Resp: Tachypneic.  Coarse breath sounds with crackles bilateral bases.  No wheezing or rhonchi. Cardio: S1-S2 tachycardic regular.  No S3-S4.  No rubs or bruit. GI: Abdomen is soft.  Nontender nondistended.  Bowel sounds are present normal.  No masses organomegaly Extremities: No edema.  Full range of motion of lower extremities. Neurologic: Alert and oriented x3.  No focal neurological deficits.      Lab Results:  Data Reviewed: I have personally reviewed following labs and imaging studies  CBC: Recent Labs  Lab 03/08/20 1115 03/09/20 1911 03/10/20 0750 03/11/20 0500 03/12/20 0441  WBC 18.2* 16.5* 19.7* 14.5* 15.5*  NEUTROABS 15.7* 15.1* 18.3* 12.2* 13.3*  HGB 12.2* 11.6* 12.5* 11.2* 12.5*  HCT 36.3* 36.4* 39.2 35.4* 39.2  MCV 93.8 96.6 96.3 95.9 96.8  PLT 509* 552* 584* 554* 631*    Basic Metabolic Panel: Recent Labs  Lab 03/08/20 1115 03/09/20 1911 03/10/20 0750 03/11/20 0500 03/12/20 0441  NA 133* 139 138 137 137  K 3.8 4.5 4.4 4.2 4.3  CL 96* 103 102 103 101  CO2 25 23 24 24 27   GLUCOSE 176* 188* 135* 132* 130*  BUN 12 18 18 19 18   CREATININE 1.03 0.98 0.98 1.00 1.01  CALCIUM 7.9* 7.9* 8.3* 8.3* 8.5*  MG  --   --  2.6* 2.4 2.4  PHOS  --   --  3.3  --   --     GFR: Estimated Creatinine Clearance: 86.1 mL/min (by C-G formula based on SCr of 1.01 mg/dL).  Liver Function Tests: Recent Labs  Lab 03/08/20 1115 03/09/20 1911 03/10/20 0750 03/11/20 0500 03/12/20 0441  AST 55* 35 34 32 29   ALT 75* 58* 56* 47* 49*  ALKPHOS 113 103 98 84 84  BILITOT 1.0 1.0 0.8 1.1 0.7  PROT 7.1 7.1 7.3 7.0 7.7  ALBUMIN 2.1* 1.9* 2.1* 1.9* 2.2*    Anemia Panel: Recent Labs    03/09/20 1911 03/10/20 0750  FERRITIN 1,390* 1,312*    Recent Results (from the past 240 hour(s))  Blood Culture (routine x 2)     Status: None (Preliminary result)   Collection Time: 03/08/20 11:15 AM   Specimen: BLOOD  Result Value Ref Range Status   Specimen Description   Final    BLOOD LEFT ANTECUBITAL Performed at [facility], [address], [place], [address] [place]    Special Requests   Final    BOTTLES DRAWN AEROBIC AND ANAEROBIC Blood Culture adequate volume Performed at [facility], [address] [place], [address] [place]    Culture   Final    NO GROWTH 4 DAYS Performed at [facility], [address], [place], [address] [place]    Report Status PENDING  Incomplete  Respiratory Panel by RT PCR (Flu A&B, Covid) - Nasopharyngeal Swab     Status: Abnormal   Collection Time: 03/08/20 11:24 AM   Specimen: Nasopharyngeal Swab  Result Value Ref Range Status   SARS Coronavirus 2 by RT PCR POSITIVE (A) NEGATIVE Final    Comment: RESULT CALLED TO, READ BACK BY AND VERIFIED WITH: MARVA SIMMS RN @1311  03/08/2020 OLSONM (NOTE) SARS-CoV-2 target nucleic acids are DETECTED.  SARS-CoV-2 RNA is generally detectable in upper respiratory specimens  during the acute phase of infection. Positive results are indicative of the presence of the identified virus, but do not rule out bacterial infection or co-infection with other pathogens not detected by the test. Clinical correlation with patient history and other diagnostic information is necessary to determine patient infection status. The expected result is Negative.  Fact Sheet  for Patients:  https://www.moore.com/https://www.fda.gov/media/142436/download  Fact Sheet for Healthcare Providers: https://www.young.biz/https://www.fda.gov/media/142435/download  This  test is not yet approved or cleared by the [place] FDA and  has been authorized for detection and/or diagnosis of SARS-CoV-2 by FDA under an Emergency Use Authorization (EUA).  This EUA will remain in effect (meaning this test can  be used) for the duration of  the COVID-19 declaration under Section 564(b)(1) of the Act, 21 U.S.C. section 360bbb-3(b)(1), unless the authorization is terminated or revoked sooner.      Influenza A by PCR NEGATIVE NEGATIVE Final   Influenza B by PCR NEGATIVE NEGATIVE Final    Comment: (NOTE) The Xpert Xpress SARS-CoV-2/FLU/RSV assay is intended as an aid in  the diagnosis of influenza from Nasopharyngeal swab specimens and  should not be used as a sole basis for treatment. Nasal washings and  aspirates are unacceptable for Xpert Xpress SARS-CoV-2/FLU/RSV  testing.  Fact Sheet for Patients: https://www.moore.com/https://www.fda.gov/media/142436/download  Fact Sheet for Healthcare Providers: https://www.young.biz/https://www.fda.gov/media/142435/download  This test is not yet approved or cleared by the [place] FDA and  has been authorized for detection and/or diagnosis of SARS-CoV-2 by  FDA under an Emergency Use Authorization (EUA). This EUA will remain  in effect (meaning this test can be used) for the duration of the  Covid-19 declaration under Section 564(b)(1) of the Act, 21  U.S.C. section 360bbb-3(b)(1), unless the authorization is  terminated or revoked. Performed at [facility] [place], [address] [place], [place] [address]   Blood Culture (routine x 2)     Status: None (Preliminary result)   Collection Time: 03/08/20 11:25 AM   Specimen: BLOOD LEFT HAND  Result Value Ref Range Status   Specimen Description   Final    BLOOD LEFT HAND Performed at [facility] [place], [address] [place], [place] [address]    Special Requests   Final    BOTTLES DRAWN AEROBIC AND ANAEROBIC Blood Culture adequate volume Performed at [facility] [place], [address] [place], [place] [address]    Culture   Final    NO GROWTH 4 DAYS Performed at [facility], [address] [place], [place] [address]    Report Status PENDING  Incomplete  MRSA PCR Screening     Status: None   Collection Time: 03/11/20 10:21 PM   Specimen: Nasal Mucosa; Nasopharyngeal  Result Value Ref Range Status   MRSA by PCR NEGATIVE NEGATIVE Final    Comment:        The GeneXpert MRSA Assay (FDA approved for NASAL specimens only), is one component of a comprehensive MRSA colonization surveillance program. It is not intended to diagnose MRSA infection nor to guide or monitor treatment for MRSA infections. Performed at [facility], [address] [place], [place] [address]       Radiology Studies: No results found.     LOS: 3 days   Yasmine Kilbourne [employer]rishnan  [facility] Pager on [email or web address]  03/12/2020, 11:45 AM

## 2020-03-12 NOTE — Plan of Care (Signed)
  Problem: Education: Goal: Knowledge of General Education information will improve Description: Including pain rating scale, medication(s)/side effects and non-pharmacologic comfort measures Outcome: Progressing   Problem: Health Behavior/Discharge Planning: Goal: Ability to manage health-related needs will improve Outcome: Progressing   Problem: Clinical Measurements: Goal: Ability to maintain clinical measurements within normal limits will improve Outcome: Progressing Goal: Will remain free from infection Outcome: Progressing Goal: Diagnostic test results will improve Outcome: Progressing Goal: Respiratory complications will improve Outcome: Progressing Goal: Cardiovascular complication will be avoided Outcome: Progressing   Problem: Activity: Goal: Risk for activity intolerance will decrease Outcome: Progressing   Problem: Nutrition: Goal: Adequate nutrition will be maintained Outcome: Progressing   Problem: Coping: Goal: Level of anxiety will decrease Outcome: Progressing   Problem: Coping: Goal: Level of anxiety will decrease Outcome: Progressing   Problem: Elimination: Goal: Will not experience complications related to bowel motility Outcome: Progressing Goal: Will not experience complications related to urinary retention Outcome: Progressing   Problem: Pain Managment: Goal: General experience of comfort will improve Outcome: Progressing   Problem: Safety: Goal: Ability to remain free from injury will improve Outcome: Progressing   Problem: Skin Integrity: Goal: Risk for impaired skin integrity will decrease Outcome: Progressing   Problem: Education: Goal: Knowledge of risk factors and measures for prevention of condition will improve Outcome: Progressing   Problem: Coping: Goal: Psychosocial and spiritual needs will be supported Outcome: Progressing   Problem: Respiratory: Goal: Will maintain a patent airway Outcome: Progressing Goal:  Complications related to the disease process, condition or treatment will be avoided or minimized Outcome: Progressing

## 2020-03-12 NOTE — Progress Notes (Signed)
Lower extremity venous bilateral study completed.   Please see CV Proc for preliminary results.   Hadessah Grennan, RDMS  

## 2020-03-12 NOTE — Progress Notes (Signed)
VAST consulted to assess current IV site. Upon arrival at pt's bedside, pt stated his IV is working fine after his nurse changed the dressing. He verbalized there is no pain at the site. IV is infusing abx at 274mls/hr without difficulty.

## 2020-03-13 LAB — CBC WITH DIFFERENTIAL/PLATELET
Abs Immature Granulocytes: 0.07 10*3/uL (ref 0.00–0.07)
Basophils Absolute: 0 10*3/uL (ref 0.0–0.1)
Basophils Relative: 0 %
Eosinophils Absolute: 0 10*3/uL (ref 0.0–0.5)
Eosinophils Relative: 0 %
HCT: 36 % — ABNORMAL LOW (ref 39.0–52.0)
Hemoglobin: 11.5 g/dL — ABNORMAL LOW (ref 13.0–17.0)
Immature Granulocytes: 1 %
Lymphocytes Relative: 5 %
Lymphs Abs: 0.7 10*3/uL (ref 0.7–4.0)
MCH: 30.8 pg (ref 26.0–34.0)
MCHC: 31.9 g/dL (ref 30.0–36.0)
MCV: 96.5 fL (ref 80.0–100.0)
Monocytes Absolute: 0.7 10*3/uL (ref 0.1–1.0)
Monocytes Relative: 5 %
Neutro Abs: 11.2 10*3/uL — ABNORMAL HIGH (ref 1.7–7.7)
Neutrophils Relative %: 89 %
Platelets: 623 10*3/uL — ABNORMAL HIGH (ref 150–400)
RBC: 3.73 MIL/uL — ABNORMAL LOW (ref 4.22–5.81)
RDW: 13.6 % (ref 11.5–15.5)
WBC: 12.6 10*3/uL — ABNORMAL HIGH (ref 4.0–10.5)
nRBC: 0 % (ref 0.0–0.2)

## 2020-03-13 LAB — COMPREHENSIVE METABOLIC PANEL
ALT: 37 U/L (ref 0–44)
AST: 20 U/L (ref 15–41)
Albumin: 2.1 g/dL — ABNORMAL LOW (ref 3.5–5.0)
Alkaline Phosphatase: 69 U/L (ref 38–126)
Anion gap: 9 (ref 5–15)
BUN: 22 mg/dL (ref 8–23)
CO2: 25 mmol/L (ref 22–32)
Calcium: 8.4 mg/dL — ABNORMAL LOW (ref 8.9–10.3)
Chloride: 99 mmol/L (ref 98–111)
Creatinine, Ser: 0.93 mg/dL (ref 0.61–1.24)
GFR calc Af Amer: >60 mL/min (ref >60)
GFR calc non Af Amer: >60 mL/min (ref >60)
Glucose, Bld: 142 mg/dL — ABNORMAL HIGH (ref 70–99)
Potassium: 4.4 mmol/L (ref 3.5–5.1)
Sodium: 133 mmol/L — ABNORMAL LOW (ref 135–145)
Total Bilirubin: 0.7 mg/dL (ref 0.3–1.2)
Total Protein: 6.8 g/dL (ref 6.5–8.1)

## 2020-03-13 LAB — CULTURE, BLOOD (ROUTINE X 2)
Culture: NO GROWTH
Culture: NO GROWTH
Special Requests: ADEQUATE
Special Requests: ADEQUATE

## 2020-03-13 LAB — GLUCOSE, CAPILLARY: Glucose-Capillary: 223 mg/dL — ABNORMAL HIGH (ref 70–99)

## 2020-03-13 LAB — D-DIMER, QUANTITATIVE: D-Dimer, Quant: 2.57 ug/mL-FEU — ABNORMAL HIGH (ref 0.00–0.50)

## 2020-03-13 LAB — C-REACTIVE PROTEIN: CRP: 2.8 mg/dL — ABNORMAL HIGH (ref <1.0)

## 2020-03-13 LAB — MAGNESIUM: Magnesium: 2.3 mg/dL (ref 1.7–2.4)

## 2020-03-13 MED ORDER — FUROSEMIDE 10 MG/ML IJ SOLN
40.0000 mg | Freq: Once | INTRAMUSCULAR | Status: AC
  Administered 2020-03-13: 40 mg via INTRAVENOUS
  Filled 2020-03-13: qty 4

## 2020-03-13 NOTE — Progress Notes (Signed)
PROGRESS NOTE  Tyler Jackson FXT:[id number] DOB: Jun 27, 1950 DOA: 03/08/2020  PCP: Patient, No Pcp Per  Brief History/Interval Summary: 69 y.o. male with no significant past medical history presented to emergency department with worsening shortness of breath and cough since 1 week. He is not vaccinated against COVID-19.  He was noted to be tachycardic tachypneic and hypoxemic.  Requiring high flow nasal cannula.  COVID-19 test came back positive.  CT angiogram was negative for PE but showed extensive bilateral opacities.  Patient was hospitalized for further management.   Reason for Visit: Acute respiratory failure with hypoxia.  Pneumonia due to COVID-19  Consultants: None  Procedures: None  Antibiotics: Anti-infectives (From admission, onward)   Start     Dose/Rate Route Frequency Ordered Stop   03/09/20 1000  remdesivir 100 mg in sodium chloride 0.9 % 100 mL IVPB        100 mg 200 mL/hr over 30 Minutes Intravenous Daily 03/08/20 1338 03/12/20 0948   03/08/20 1645  cefTRIAXone (ROCEPHIN) 1 g in sodium chloride 0.9 % 100 mL IVPB        1 g 200 mL/hr over 30 Minutes Intravenous Every 24 hours 03/08/20 1640 03/12/20 1630   03/08/20 1645  azithromycin (ZITHROMAX) 500 mg in sodium chloride 0.9 % 250 mL IVPB        500 mg 250 mL/hr over 60 Minutes Intravenous Every 24 hours 03/08/20 1640 03/13/20 1644   03/08/20 1430  remdesivir 100 mg in sodium chloride 0.9 % 100 mL IVPB       "Followed by" Linked Group Details   100 mg 200 mL/hr over 30 Minutes Intravenous  Once 03/08/20 1338 03/08/20 1508   03/08/20 1400  remdesivir 100 mg in sodium chloride 0.9 % 100 mL IVPB       "Followed by" Linked Group Details   100 mg 200 mL/hr over 30 Minutes Intravenous  Once 03/08/20 1338 03/08/20 1434      Subjective/Interval History: Patient states that he had reasonably good night.  Still remain short of breath but not as bad as yesterday morning.  Denies any chest pain.  Continues to have a dry  cough.      Assessment/Plan:  Acute Hypoxic Resp. Failure/Pneumonia due to COVID-19  Recent Labs  Lab 03/08/20 1115 03/08/20 1115 03/09/20 1911 03/10/20 0750 03/11/20 0500 03/12/20 0441 03/13/20 0500  DDIMER 8.73*   < > 7.16* 6.74* 3.64* 3.28* 2.57*  FERRITIN 1,305*  --  1,390* 1,312*  --   --   --   CRP 30.9*   < > 17.8* 15.6* 9.6* 6.9* 2.8*  ALT 75*   < > 58* 56* 47* 49* 37  PROCALCITON 0.19  --  0.30  --   --   --   --    < > = values in this interval not displayed.    Objective findings: Oxygen requirements: He remains on high flow nasal cannula at 15 L/min along with a nonrebreather.  Saturating in the early 90s.    COVID 19 Therapeutics: Antibacterials: On ceftriaxone and azithromycin.  Procalcitonin was 0.30. Remdesivir: Completed 5-day course on 10/3 Steroids: Solu-Medrol Diuretics: Lasix given daily for last 3 days.  We will repeat today Baricitinib: Initiated on 9/29 PUD Prophylaxis: Pepcid DVT Prophylaxis:  Lovenox 50 mg daily  From a respiratory standpoint patient remains tenuous though stable.  Continues to require high flow nasal cannula along with a nonrebreather.  Chest x-ray done yesterday morning showed stable findings.  Patient has completed 5-day  course of Remdesivir.  He remains on steroids and baricitinib.  5-day course of antibacterials.  D-dimer is improving.  CRP is improving as well.  CT angiogram was negative for PE.  Lower extremity Doppler study negative for DVT.  Patient did not want the inhaled steroids.  He states that it was making him very anxious.  [name] was discontinued.  Continue incentive spirometry, mobilization, prone positioning.  The treatment plan and use of medications and known side effects were discussed with patient. Some of the medications used are based on case reports/anecdotal data.  All other medications being used in the management of COVID-19 based on limited study data.  Complete risks and long-term side effects are  unknown, however in the best clinical judgment they seem to be of some benefit.  Patient wanted to proceed with treatment options provided.  Thrombocytosis Likely reactive due to COVID-19.  Stable  Leukocytosis Secondary to steroids.  Transaminitis Secondary to COVID-19.  Resolved.  Normocytic anemia Stable.  No evidence of overt bleeding.  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 Incidentally noted on CT angiogram.  Annual imaging is recommended.    Obesity Estimated body mass index is 31.24 kg/m as calculated from the following:   Height as of this encounter: 5\' 11"  (1.803 m).   Weight as of this encounter: 101.6 kg.   DVT Prophylaxis: Lovenox Code Status: Full code Family Communication: Discussed with the patient Disposition Plan: Hopefully return home when improved  Status is: Inpatient  Remains inpatient appropriate because:IV treatments appropriate due to intensity of illness or inability to take PO and Inpatient level of care appropriate due to severity of illness   Dispo: The patient is from: Home              Anticipated d/c is to: Home              Anticipated d/c date is: > 3 days              Patient currently is not medically stable to d/c.      Medications:  Scheduled: . vitamin C  500 mg Oral Daily  . baricitinib  4 mg Oral Daily  . enoxaparin (LOVENOX) injection  50 mg Subcutaneous Q24H  . famotidine  20 mg Oral Daily  . guaiFENesin  600 mg Oral BID  . methylPREDNISolone (SOLU-MEDROL) injection  80 mg Intravenous Q12H  . zinc sulfate  220 mg Oral Daily   Continuous: . azithromycin 500 mg (03/12/20 1848)   05/12/20, albuterol, guaiFENesin-dextromethorphan, ondansetron **OR** ondansetron (ZOFRAN) IV   Objective:  Vital Signs  Vitals:   03/12/20 2336 03/13/20 0322 03/13/20 0720 03/13/20 0852  BP: 105/70 109/72 113/81   Pulse: 93 94    Resp: 20 20 (!) 23   Temp: 98.1 F (36.7 C) 98 F  (36.7 C) 98.1 F (36.7 C)   TempSrc: Oral Axillary Axillary   SpO2: 93% 95%  90%  Weight:      Height:        Intake/Output Summary (Last 24 hours) at 03/13/2020 0940 Last data filed at 03/13/2020 0900 Gross per 24 hour  Intake 300 ml  Output 500 ml  Net -200 ml   Filed Weights   03/08/20 1111 03/09/20 1006  Weight: 101.6 kg 101.6 kg    General appearance: Awake alert.  In no distress Resp: Tachypneic at rest.  No use of accessory muscles.  Crackles bilateral bases.  No wheezing or rhonchi.  Cardio: S1-S2 is normal regular.  No S3-S4.  No rubs murmurs or bruit GI: Abdomen is soft.  Nontender nondistended.  Bowel sounds are present normal.  No masses organomegaly Extremities: No edema.  Full range of motion of lower extremities. Neurologic: Alert and oriented x3.  No focal neurological deficits.     Lab Results:  Data Reviewed: I have personally reviewed following labs and imaging studies  CBC: Recent Labs  Lab 03/09/20 1911 03/10/20 0750 03/11/20 0500 03/12/20 0441 03/13/20 0500  WBC 16.5* 19.7* 14.5* 15.5* 12.6*  NEUTROABS 15.1* 18.3* 12.2* 13.3* 11.2*  HGB 11.6* 12.5* 11.2* 12.5* 11.5*  HCT 36.4* 39.2 35.4* 39.2 36.0*  MCV 96.6 96.3 95.9 96.8 96.5  PLT 552* 584* 554* 631* 623*    Basic Metabolic Panel: Recent Labs  Lab 03/09/20 1911 03/10/20 0750 03/11/20 0500 03/12/20 0441 03/13/20 0500  NA 139 138 137 137 133*  K 4.5 4.4 4.2 4.3 4.4  CL 103 102 103 101 99  CO2 GLUCOSE 188* 135* 132* 130* 142*  BUN CREATININE 0.98 0.98 1.00 1.01 0.93  CALCIUM 7.9* 8.3* 8.3* 8.5* 8.4*  MG  --  2.6* 2.4 2.4 2.3  PHOS  --  3.3  --   --   --     GFR: Estimated Creatinine Clearance: 93.5 mL/min (by C-G formula based on SCr of 0.93 mg/dL).  Liver Function Tests: Recent Labs  Lab 03/09/20 1911 03/10/20 0750 03/11/20 0500 03/12/20 0441 03/13/20 0500  AST 35 34 32 29 20  ALT 58* 56* 47* 49* 37  ALKPHOS 103 98 84 84 69    BILITOT 1.0 0.8 1.1 0.7 0.7  PROT 7.1 7.3 7.0 7.7 6.8  ALBUMIN 1.9* 2.1* 1.9* 2.2* 2.1*     Recent Results (from the past 240 hour(s))  Blood Culture (routine x 2)     Status: None (Preliminary result)   Collection Time: 03/08/20 11:15 AM   Specimen: BLOOD  Result Value Ref Range Status   Specimen Description   Final    BLOOD LEFT ANTECUBITAL Performed at [facility], [address] [place], [place] [address]    Special Requests   Final    BOTTLES DRAWN AEROBIC AND ANAEROBIC Blood Culture adequate volume Performed at [facility], [address] [place], [place] [address]    Culture   Final    NO GROWTH 4 DAYS Performed at [facility], [address], [place], [place] [address]    Report Status PENDING  Incomplete  Respiratory Panel by RT PCR (Flu A&B, Covid) - Nasopharyngeal Swab     Status: Abnormal   Collection Time: 03/08/20 11:24 AM   Specimen: Nasopharyngeal Swab  Result Value Ref Range Status   SARS Coronavirus 2 by RT PCR POSITIVE (A) NEGATIVE Final    Comment: RESULT CALLED TO, READ BACK BY AND VERIFIED WITH: MARVA SIMMS RN  03/08/2020 OLSONM (NOTE) SARS-CoV-2 target nucleic acids are DETECTED.  SARS-CoV-2 RNA is generally detectable in upper respiratory specimens  during the acute phase of infection. Positive results are indicative of the presence of the identified virus, but do not rule out bacterial infection or co-infection with other pathogens not detected by the test. Clinical correlation with patient history and other diagnostic information is necessary to determine patient infection status. The expected result is Negative.  Fact Sheet for Patients:  https://www.moore.com/  Fact Sheet for Healthcare Providers: https://www.young.biz/  This test is not yet approved or cleared by the [place] and  has been authorized for detection and/or diagnosis of SARS-CoV-2 by FDA under  an Emergency Use Authorization (EUA).  This EUA will remain in effect (meaning this test can  be used) for the duration of  the COVID-19 declaration under Section 564(b)(1) of the Act, 21 U.S.C. section 360bbb-3(b)(1), unless the authorization is terminated or revoked sooner.      Influenza A by PCR NEGATIVE NEGATIVE Final   Influenza B by PCR NEGATIVE NEGATIVE Final    Comment: (NOTE) The Xpert Xpress SARS-CoV-2/FLU/RSV assay is intended as an aid in  the diagnosis of influenza from Nasopharyngeal swab specimens and  should not be used as a sole basis for treatment. Nasal washings and  aspirates are unacceptable for Xpert Xpress SARS-CoV-2/FLU/RSV  testing.  Fact Sheet for Patients: https://www.moore.com/  Fact Sheet for Healthcare Providers: https://www.young.biz/  This test is not yet approved or cleared by the [place] FDA and  has been authorized for detection and/or diagnosis of SARS-CoV-2 by  FDA under an Emergency Use Authorization (EUA). This EUA will remain  in effect (meaning this test can be used) for the duration of the  Covid-19 declaration under Section 564(b)(1) of the Act, 21  U.S.C. section 360bbb-3(b)(1), unless the authorization is  terminated or revoked. Performed at [facility], [address] [place], [place] [address]   Blood Culture (routine x 2)     Status: None (Preliminary result)   Collection Time: 03/08/20 11:25 AM   Specimen: BLOOD LEFT HAND  Result Value Ref Range Status   Specimen Description   Final    BLOOD LEFT HAND Performed at [facility], [address] [place], [place] [address]    Special Requests   Final    BOTTLES DRAWN AEROBIC AND ANAEROBIC Blood Culture adequate volume Performed at [facility], [address] [place], [place] [address]    Culture   Final    NO GROWTH 4 DAYS Performed at [facility], [address], [place], [place]  [address]    Report Status PENDING  Incomplete  MRSA PCR Screening     Status: None   Collection Time: 03/11/20 10:21 PM   Specimen: Nasal Mucosa; Nasopharyngeal  Result Value Ref Range Status   MRSA by PCR NEGATIVE NEGATIVE Final    Comment:        The GeneXpert MRSA Assay (FDA approved for NASAL specimens only), is one component of a comprehensive MRSA colonization surveillance program. It is not intended to diagnose MRSA infection nor to guide or monitor treatment for MRSA infections. Performed at [facility], [address], [place], [place] [address]       Radiology Studies: [facility] Chest [place] 1V same Day  Result Date: 03/12/2020 CLINICAL DATA:  Hypoxia. Worsening shortness of breath and cough. COVID-19 positive. EXAM: PORTABLE CHEST 1 VIEW COMPARISON:  Chest radiograph and CTA 03/08/2020 FINDINGS: The cardiomediastinal silhouette is unchanged and accentuated by portable AP technique and mildly low lung volumes. Extensive peripheral and basilar opacities in both lungs have not significantly changed. No large pleural effusion or pneumothorax is identified. No acute osseous abnormality is seen. IMPRESSION: Unchanged COVID-19 pneumonia. Electronically Signed   By: Sebastian Ache M.D.   On: 03/12/2020 11:49   VAS [place] LOWER EXTREMITY VENOUS (DVT)  Result Date: 03/12/2020  Lower Venous DVTStudy Indications: Swelling, and D-dimer.  Comparison Study:  No prior studies. Performing Technologist: Jean Rosenthalachel Hodge  Examination Guidelines: A complete evaluation includes B-mode imaging, spectral Doppler, color Doppler, and power Doppler as needed of all accessible portions of each vessel. Bilateral testing is considered an integral part of a complete examination. Limited examinations for reoccurring indications may be performed as noted. The reflux portion of the exam is performed with the patient in reverse Trendelenburg.  +---------+---------------+---------+-----------+----------+--------------+ RIGHT     CompressibilityPhasicitySpontaneityPropertiesThrombus Aging +---------+---------------+---------+-----------+----------+--------------+ CFV      Full           Yes      Yes                                 +---------+---------------+---------+-----------+----------+--------------+ SFJ      Full                                                        +---------+---------------+---------+-----------+----------+--------------+ FV Prox  Full                                                        +---------+---------------+---------+-----------+----------+--------------+ FV Mid   Full                                                        +---------+---------------+---------+-----------+----------+--------------+ FV DistalFull                                                        +---------+---------------+---------+-----------+----------+--------------+ PFV      Full                                                        +---------+---------------+---------+-----------+----------+--------------+ POP      Full           Yes      Yes                                 +---------+---------------+---------+-----------+----------+--------------+ PTV      Full                                                        +---------+---------------+---------+-----------+----------+--------------+ PERO     Full                                                        +---------+---------------+---------+-----------+----------+--------------+   +---------+---------------+---------+-----------+----------+--------------+  LEFT     CompressibilityPhasicitySpontaneityPropertiesThrombus Aging +---------+---------------+---------+-----------+----------+--------------+ CFV      Full           Yes      Yes                                 +---------+---------------+---------+-----------+----------+--------------+ SFJ      Full                                                         +---------+---------------+---------+-----------+----------+--------------+ FV Prox  Full                                                        +---------+---------------+---------+-----------+----------+--------------+ FV Mid   Full                                                        +---------+---------------+---------+-----------+----------+--------------+ FV DistalFull                                                        +---------+---------------+---------+-----------+----------+--------------+ PFV      Full                                                        +---------+---------------+---------+-----------+----------+--------------+ POP      Full           Yes      Yes                                 +---------+---------------+---------+-----------+----------+--------------+ PTV      Full                                                        +---------+---------------+---------+-----------+----------+--------------+ PERO     Full                                                        +---------+---------------+---------+-----------+----------+--------------+     Summary: RIGHT: - There is no evidence of deep vein thrombosis in the lower extremity.  - No cystic structure found in the popliteal fossa.  LEFT: - There is no evidence of deep vein thrombosis in the lower extremity.  - No  cystic structure found in the popliteal fossa.  *See table(s) above for measurements and observations. Electronically signed by Coral Else MD on 03/12/2020 at 7:42:12 PM.    Final        LOS: 4 days   Osvaldo Shipper  [facility] Pager on [email or web address]  03/13/2020, 9:40 AM

## 2020-03-14 LAB — CBC WITH DIFFERENTIAL/PLATELET
Abs Immature Granulocytes: 0.07 10*3/uL (ref 0.00–0.07)
Basophils Absolute: 0 10*3/uL (ref 0.0–0.1)
Basophils Relative: 0 %
Eosinophils Absolute: 0 10*3/uL (ref 0.0–0.5)
Eosinophils Relative: 0 %
HCT: 39.8 % (ref 39.0–52.0)
Hemoglobin: 12.3 g/dL — ABNORMAL LOW (ref 13.0–17.0)
Immature Granulocytes: 1 %
Lymphocytes Relative: 7 %
Lymphs Abs: 1 10*3/uL (ref 0.7–4.0)
MCH: 29.9 pg (ref 26.0–34.0)
MCHC: 30.9 g/dL (ref 30.0–36.0)
MCV: 96.8 fL (ref 80.0–100.0)
Monocytes Absolute: 0.8 10*3/uL (ref 0.1–1.0)
Monocytes Relative: 6 %
Neutro Abs: 12 10*3/uL — ABNORMAL HIGH (ref 1.7–7.7)
Neutrophils Relative %: 86 %
Platelets: 575 10*3/uL — ABNORMAL HIGH (ref 150–400)
RBC: 4.11 MIL/uL — ABNORMAL LOW (ref 4.22–5.81)
RDW: 13.5 % (ref 11.5–15.5)
WBC: 13.9 10*3/uL — ABNORMAL HIGH (ref 4.0–10.5)
nRBC: 0 % (ref 0.0–0.2)

## 2020-03-14 LAB — COMPREHENSIVE METABOLIC PANEL
ALT: 35 U/L (ref 0–44)
AST: 20 U/L (ref 15–41)
Albumin: 2.4 g/dL — ABNORMAL LOW (ref 3.5–5.0)
Alkaline Phosphatase: 72 U/L (ref 38–126)
Anion gap: 11 (ref 5–15)
BUN: 28 mg/dL — ABNORMAL HIGH (ref 8–23)
CO2: 24 mmol/L (ref 22–32)
Calcium: 8.7 mg/dL — ABNORMAL LOW (ref 8.9–10.3)
Chloride: 99 mmol/L (ref 98–111)
Creatinine, Ser: 0.98 mg/dL (ref 0.61–1.24)
GFR calc Af Amer: >60 mL/min (ref >60)
GFR calc non Af Amer: >60 mL/min (ref >60)
Glucose, Bld: 153 mg/dL — ABNORMAL HIGH (ref 70–99)
Potassium: 4.6 mmol/L (ref 3.5–5.1)
Sodium: 134 mmol/L — ABNORMAL LOW (ref 135–145)
Total Bilirubin: 0.8 mg/dL (ref 0.3–1.2)
Total Protein: 7.3 g/dL (ref 6.5–8.1)

## 2020-03-14 LAB — D-DIMER, QUANTITATIVE: D-Dimer, Quant: 2.37 ug/mL-FEU — ABNORMAL HIGH (ref 0.00–0.50)

## 2020-03-14 LAB — C-REACTIVE PROTEIN: CRP: 1.5 mg/dL — ABNORMAL HIGH (ref <1.0)

## 2020-03-14 LAB — MAGNESIUM: Magnesium: 2.3 mg/dL (ref 1.7–2.4)

## 2020-03-14 MED ORDER — FUROSEMIDE 10 MG/ML IJ SOLN
40.0000 mg | Freq: Once | INTRAMUSCULAR | Status: AC
  Administered 2020-03-14: 40 mg via INTRAVENOUS
  Filled 2020-03-14: qty 4

## 2020-03-14 MED ORDER — SALINE SPRAY 0.65 % NA SOLN
1.0000 | NASAL | Status: DC | PRN
  Administered 2020-03-20: 1 via NASAL
  Filled 2020-03-14: qty 44

## 2020-03-14 NOTE — TOC Progression Note (Signed)
Transition of Care [facility]) - Progression Note    Patient Details  Name: Tyler Jackson MRN: [id number] Date of Birth: Jan 28, 1951  [facility] CM/SW Contact  Nance Pear, RN Phone Number: 03/14/2020, 10:26 AM  Clinical Narrative:    Case manager noted patient does not have PCP.  Case manager gave information on [facility].  Patient understands that needs to follow up with [facility] within 7 days of discharge.

## 2020-03-14 NOTE — Progress Notes (Addendum)
PROGRESS NOTE  Tyler Jackson RUE:[id number] DOB: 1951/05/04 DOA: 03/08/2020  PCP: Patient, No Pcp Per  Brief History/Interval Summary: 69 y.o. male with no significant past medical history presented to emergency department with worsening shortness of breath and cough since 1 week. He is not vaccinated against COVID-19.  He was noted to be tachycardic tachypneic and hypoxemic.  Requiring high flow nasal cannula.  COVID-19 test came back positive.  CT angiogram was negative for PE but showed extensive bilateral opacities.  Patient was hospitalized for further management.   Reason for Visit: Acute respiratory failure with hypoxia.  Pneumonia due to COVID-19  Consultants: None  Procedures: None  Antibiotics: Anti-infectives (From admission, onward)   Start     Dose/Rate Route Frequency Ordered Stop   03/09/20 1000  remdesivir 100 mg in sodium chloride 0.9 % 100 mL IVPB        100 mg 200 mL/hr over 30 Minutes Intravenous Daily 03/08/20 1338 03/12/20 0948   03/08/20 1645  cefTRIAXone (ROCEPHIN) 1 g in sodium chloride 0.9 % 100 mL IVPB        1 g 200 mL/hr over 30 Minutes Intravenous Every 24 hours 03/08/20 1640 03/12/20 1630   03/08/20 1645  azithromycin (ZITHROMAX) 500 mg in sodium chloride 0.9 % 250 mL IVPB        500 mg 250 mL/hr over 60 Minutes Intravenous Every 24 hours 03/08/20 1640 03/13/20 1644   03/08/20 1430  remdesivir 100 mg in sodium chloride 0.9 % 100 mL IVPB       "Followed by" Linked Group Details   100 mg 200 mL/hr over 30 Minutes Intravenous  Once 03/08/20 1338 03/08/20 1508   03/08/20 1400  remdesivir 100 mg in sodium chloride 0.9 % 100 mL IVPB       "Followed by" Linked Group Details   100 mg 200 mL/hr over 30 Minutes Intravenous  Once 03/08/20 1338 03/08/20 1434      Subjective/Interval History: Patient states that did not have any problems overnight however continues to have difficulty breathing mainly with exertion.  Continues to have a dry cough.  Less  anxious today compared to yesterday.    Assessment/Plan:  Acute Hypoxic Resp. Failure/Pneumonia due to COVID-19  Recent Labs  Lab 03/08/20 1115 03/08/20 1115 03/09/20 1911 03/09/20 1911 03/10/20 0750 03/11/20 0500 03/12/20 0441 03/13/20 0500 03/14/20 0419  DDIMER 8.73*   < > 7.16*   < > 6.74* 3.64* 3.28* 2.57* 2.37*  FERRITIN 1,305*  --  1,390*  --  1,312*  --   --   --   --   CRP 30.9*   < > 17.8*   < > 15.6* 9.6* 6.9* 2.8* 1.5*  ALT 75*   < > 58*   < > 56* 47* 49* 37 35  PROCALCITON 0.19  --  0.30  --   --   --   --   --   --    < > = values in this interval not displayed.    Objective findings: Oxygen requirements: Remains on HFNC along with a nonrebreather.  Saturating in the early 90s.    COVID 19 Therapeutics: Antibacterials: Completed 5-day course of ceftriaxone and azithromycin.  Procalcitonin was 0.30. Remdesivir: Completed 5-day course on 10/3 Steroids: Solu-Medrol Diuretics: Lasix given daily for last 4 days.  We will repeat dose today. Baricitinib: Initiated on 9/29 PUD Prophylaxis: Pepcid DVT Prophylaxis:  Lovenox 50 mg daily  From a respiratory standpoint patient remains tenuous though stable.  Perhaps slightly better today compared to yesterday.  He has completed course of Remdesivir and antibacterials.  Remains on steroids and baricitinib.  Keep him in negative fluid balance.  Additional dose of furosemide today.  D-dimer is improving.  CRP is also improving.  Mobilize.  Incentive spirometry.  CT angiogram was negative for PE.  Lower extremity Doppler study negative for DVT.  Patient did not want the inhaled steroids.  He states that it was making him very anxious.  [name] was discontinued.  The treatment plan and use of medications and known side effects were discussed with patient. Some of the medications used are based on case reports/anecdotal data.  All other medications being used in the management of COVID-19 based on limited study data.  Complete  risks and long-term side effects are unknown, however in the best clinical judgment they seem to be of some benefit.  Patient wanted to proceed with treatment options provided.  Thrombocytosis Likely reactive due to COVID-19.  Stable  Leukocytosis Secondary to steroids.  Transaminitis Secondary to COVID-19.  Resolved.  Normocytic anemia Stable.  No evidence of overt bleeding.  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 Incidentally noted on CT angiogram.  Annual imaging is recommended.    Obesity Estimated body mass index is 28.63 kg/m as calculated from the following:   Height as of this encounter: 5\' 11"  (1.803 m).   Weight as of this encounter: 93.1 kg.   DVT Prophylaxis: Lovenox Code Status: Full code Family Communication: Discussed with patient.  Sister was updated yesterday per his request. Alternative number for sister: [phone] Disposition Plan: Hopefully return home when improved  Status is: Inpatient  Remains inpatient appropriate because:IV treatments appropriate due to intensity of illness or inability to take PO and Inpatient level of care appropriate due to severity of illness   Dispo: The patient is from: Home              Anticipated d/c is to: Home              Anticipated d/c date is: > 3 days              Patient currently is not medically stable to d/c.      Medications:  Scheduled: . vitamin C  500 mg Oral Daily  . baricitinib  4 mg Oral Daily  . enoxaparin (LOVENOX) injection  50 mg Subcutaneous Q24H  . famotidine  20 mg Oral Daily  . guaiFENesin  600 mg Oral BID  . methylPREDNISolone (SOLU-MEDROL) injection  80 mg Intravenous Q12H  . zinc sulfate  220 mg Oral Daily   Continuous:  [phone], albuterol, guaiFENesin-dextromethorphan, ondansetron **OR** ondansetron (ZOFRAN) IV   Objective:  Vital Signs  Vitals:   03/14/20 0356 03/14/20 0434 03/14/20 0742 03/14/20 0838  BP: 115/74   119/75   Pulse: 97  (!) 106   Resp: (!) 22  20   Temp: 98 F (36.7 C)  98.2 F (36.8 C)   TempSrc: Oral  Axillary   SpO2: (!) 89%  90% 94%  Weight:  93.1 kg    Height:        Intake/Output Summary (Last 24 hours) at 03/14/2020 1123 Last data filed at 03/14/2020 1026 Gross per 24 hour  Intake 120 ml  Output 600 ml  Net -480 ml   Filed Weights   03/08/20 1111 03/09/20 1006 03/14/20 0434  Weight: 101.6 kg 101.6 kg 93.1 kg    General appearance:  Awake alert.  In no distress Resp: Tachypneic.  No use of accessory muscles.  Crackles bilateral bases.  No wheezing or rhonchi. Cardio: S1-S2 is normal regular.  No S3-S4.  No rubs murmurs or bruit GI: Abdomen is soft.  Nontender nondistended.  Bowel sounds are present normal.  No masses organomegaly Extremities: No edema.  Full range of motion of lower extremities. Neurologic: Alert and oriented x3.  No focal neurological deficits.      Lab Results:  Data Reviewed: I have personally reviewed following labs and imaging studies  CBC: Recent Labs  Lab 03/10/20 0750 03/11/20 0500 03/12/20 0441 03/13/20 0500 03/14/20 0419  WBC 19.7* 14.5* 15.5* 12.6* 13.9*  NEUTROABS 18.3* 12.2* 13.3* 11.2* 12.0*  HGB 12.5* 11.2* 12.5* 11.5* 12.3*  HCT 39.2 35.4* 39.2 36.0* 39.8  MCV 96.3 95.9 96.8 96.5 96.8  PLT 584* 554* 631* 623* 575*    Basic Metabolic Panel: Recent Labs  Lab 03/10/20 0750 03/11/20 0500 03/12/20 0441 03/13/20 0500 03/14/20 0419  NA 138 137 137 133* 134*  K 4.4 4.2 4.3 4.4 4.6  CL 102 103 101 99 99  CO2 24 24 27 25 24   GLUCOSE 135* 132* 130* 142* 153*  BUN 18 19 18 22  28*  CREATININE 0.98 1.00 1.01 0.93 0.98  CALCIUM 8.3* 8.3* 8.5* 8.4* 8.7*  MG 2.6* 2.4 2.4 2.3 2.3  PHOS 3.3  --   --   --   --     GFR: Estimated Creatinine Clearance: 85.2 mL/min (by C-G formula based on SCr of 0.98 mg/dL).  Liver Function Tests: Recent Labs  Lab 03/10/20 0750 03/11/20 0500 03/12/20 0441 03/13/20 0500 03/14/20 0419   AST 34 32 29 20 20   ALT 56* 47* 49* 37 35  ALKPHOS 98 84 84 69 72  BILITOT 0.8 1.1 0.7 0.7 0.8  PROT 7.3 7.0 7.7 6.8 7.3  ALBUMIN 2.1* 1.9* 2.2* 2.1* 2.4*     Recent Results (from the past 240 hour(s))  Blood Culture (routine x 2)     Status: None   Collection Time: 03/08/20 11:15 AM   Specimen: BLOOD  Result Value Ref Range Status   Specimen Description   Final    BLOOD LEFT ANTECUBITAL Performed at [facility], [address], [place], [facility] [address]    Special Requests   Final    BOTTLES DRAWN AEROBIC AND ANAEROBIC Blood Culture adequate volume Performed at [facility], [address] [place], [facility] [address]    Culture   Final    NO GROWTH 5 DAYS Performed at [facility], [address] [place], [facility] [address]    Report Status 03/13/2020 FINAL  Final  Respiratory Panel by RT PCR (Flu A&B, Covid) - Nasopharyngeal Swab     Status: Abnormal   Collection Time: 03/08/20 11:24 AM   Specimen: Nasopharyngeal Swab  Result Value Ref Range Status   SARS Coronavirus 2 by RT PCR POSITIVE (A) NEGATIVE Final    Comment: RESULT CALLED TO, READ BACK BY AND VERIFIED WITH: MARVA SIMMS RN @1311  03/08/2020 OLSONM (NOTE) SARS-CoV-2 target nucleic acids are DETECTED.  SARS-CoV-2 RNA is generally detectable in upper respiratory specimens  during the acute phase of infection. Positive results are indicative of the presence of the identified virus, but do not rule out bacterial infection or co-infection with other pathogens not detected by the test. Clinical correlation with patient history and other diagnostic information is necessary to determine patient infection status.  The expected result is Negative.  Fact Sheet for Patients:  https://www.moore.com/https://www.fda.gov/media/142436/download  Fact Sheet for Healthcare Providers: https://www.young.biz/https://www.fda.gov/media/142435/download  This test is not yet approved or cleared by the [place] FDA and  has been authorized for  detection and/or diagnosis of SARS-CoV-2 by FDA under an Emergency Use Authorization (EUA).  This EUA will remain in effect (meaning this test can  be used) for the duration of  the COVID-19 declaration under Section 564(b)(1) of the Act, 21 U.S.C. section 360bbb-3(b)(1), unless the authorization is terminated or revoked sooner.      Influenza A by PCR NEGATIVE NEGATIVE Final   Influenza B by PCR NEGATIVE NEGATIVE Final    Comment: (NOTE) The Xpert Xpress SARS-CoV-2/FLU/RSV assay is intended as an aid in  the diagnosis of influenza from Nasopharyngeal swab specimens and  should not be used as a sole basis for treatment. Nasal washings and  aspirates are unacceptable for Xpert Xpress SARS-CoV-2/FLU/RSV  testing.  Fact Sheet for Patients: https://www.moore.com/https://www.fda.gov/media/142436/download  Fact Sheet for Healthcare Providers: https://www.young.biz/https://www.fda.gov/media/142435/download  This test is not yet approved or cleared by the [place] FDA and  has been authorized for detection and/or diagnosis of SARS-CoV-2 by  FDA under an Emergency Use Authorization (EUA). This EUA will remain  in effect (meaning this test can be used) for the duration of the  Covid-19 declaration under Section 564(b)(1) of the Act, 21  U.S.C. section 360bbb-3(b)(1), unless the authorization is  terminated or revoked. Performed at [facility] [place] [facility], [address] [place], [place] [address]   Blood Culture (routine x 2)     Status: None   Collection Time: 03/08/20 11:25 AM   Specimen: BLOOD LEFT HAND  Result Value Ref Range Status   Specimen Description   Final    BLOOD LEFT HAND Performed at [facility] [place] [facility], [address] [place], [place] [address]    Special Requests   Final    BOTTLES DRAWN AEROBIC AND ANAEROBIC Blood Culture adequate volume Performed at [facility] [place], [address] [place], [place] [address]    Culture   Final    NO GROWTH 5 DAYS Performed at [facility],  [address] [place], [place] [address]    Report Status 03/13/2020 FINAL  Final  MRSA PCR Screening     Status: None   Collection Time: 03/11/20 10:21 PM   Specimen: Nasal Mucosa; Nasopharyngeal  Result Value Ref Range Status   MRSA by PCR NEGATIVE NEGATIVE Final    Comment:        The GeneXpert MRSA Assay (FDA approved for NASAL specimens only), is one component of a comprehensive MRSA colonization surveillance program. It is not intended to diagnose MRSA infection nor to guide or monitor treatment for MRSA infections. Performed at [facility], [address] [place], [place] [address]       Radiology Studies: VAS US LOWER EXTREMITY VENOUS (DVT)  Result Date: 03/12/2020  Lower Venous DVTStudy Indications: Swelling, and D-dimer.  Comparison Study: No prior studies. Performing Technologist: Jean Rosenthalachel Hodge  Examination Guidelines: A complete evaluation includes B-mode imaging, spectral Doppler, color Doppler, and power Doppler as needed of all accessible portions of each vessel. Bilateral testing is considered an integral part of a complete examination. Limited examinations for reoccurring indications may be performed as noted. The reflux portion of the exam is performed with the patient in reverse Trendelenburg.  +---------+---------------+---------+-----------+----------+--------------+ RIGHT    CompressibilityPhasicitySpontaneityPropertiesThrombus Aging +---------+---------------+---------+-----------+----------+--------------+ CFV  Full           Yes      Yes                                 +---------+---------------+---------+-----------+----------+--------------+ SFJ      Full                                                        +---------+---------------+---------+-----------+----------+--------------+ FV Prox  Full                                                        +---------+---------------+---------+-----------+----------+--------------+  FV Mid   Full                                                        +---------+---------------+---------+-----------+----------+--------------+ FV DistalFull                                                        +---------+---------------+---------+-----------+----------+--------------+ PFV      Full                                                        +---------+---------------+---------+-----------+----------+--------------+ POP      Full           Yes      Yes                                 +---------+---------------+---------+-----------+----------+--------------+ PTV      Full                                                        +---------+---------------+---------+-----------+----------+--------------+ PERO     Full                                                        +---------+---------------+---------+-----------+----------+--------------+   +---------+---------------+---------+-----------+----------+--------------+ LEFT     CompressibilityPhasicitySpontaneityPropertiesThrombus Aging +---------+---------------+---------+-----------+----------+--------------+ CFV      Full           Yes      Yes                                 +---------+---------------+---------+-----------+----------+--------------+  SFJ      Full                                                        +---------+---------------+---------+-----------+----------+--------------+ FV Prox  Full                                                        +---------+---------------+---------+-----------+----------+--------------+ FV Mid   Full                                                        +---------+---------------+---------+-----------+----------+--------------+ FV DistalFull                                                        +---------+---------------+---------+-----------+----------+--------------+ PFV      Full                                                         +---------+---------------+---------+-----------+----------+--------------+ POP      Full           Yes      Yes                                 +---------+---------------+---------+-----------+----------+--------------+ PTV      Full                                                        +---------+---------------+---------+-----------+----------+--------------+ PERO     Full                                                        +---------+---------------+---------+-----------+----------+--------------+     Summary: RIGHT: - There is no evidence of deep vein thrombosis in the lower extremity.  - No cystic structure found in the popliteal fossa.  LEFT: - There is no evidence of deep vein thrombosis in the lower extremity.  - No cystic structure found in the popliteal fossa.  *See table(s) above for measurements and observations. Electronically signed by Coral Else MD on 03/12/2020 at 7:42:12 PM.    Final        LOS: 5 days   Osvaldo Shipper  [facility] Pager on [email or web address]  03/14/2020, 11:23 AM

## 2020-03-15 LAB — CBC
HCT: 40.8 % (ref 39.0–52.0)
Hemoglobin: 12.9 g/dL — ABNORMAL LOW (ref 13.0–17.0)
MCH: 30.8 pg (ref 26.0–34.0)
MCHC: 31.6 g/dL (ref 30.0–36.0)
MCV: 97.4 fL (ref 80.0–100.0)
Platelets: 519 10*3/uL — ABNORMAL HIGH (ref 150–400)
RBC: 4.19 MIL/uL — ABNORMAL LOW (ref 4.22–5.81)
RDW: 13.6 % (ref 11.5–15.5)
WBC: 12 10*3/uL — ABNORMAL HIGH (ref 4.0–10.5)
nRBC: 0 % (ref 0.0–0.2)

## 2020-03-15 LAB — COMPREHENSIVE METABOLIC PANEL
ALT: 33 U/L (ref 0–44)
AST: 21 U/L (ref 15–41)
Albumin: 2.4 g/dL — ABNORMAL LOW (ref 3.5–5.0)
Alkaline Phosphatase: 68 U/L (ref 38–126)
Anion gap: 14 (ref 5–15)
BUN: 27 mg/dL — ABNORMAL HIGH (ref 8–23)
CO2: 23 mmol/L (ref 22–32)
Calcium: 8.7 mg/dL — ABNORMAL LOW (ref 8.9–10.3)
Chloride: 98 mmol/L (ref 98–111)
Creatinine, Ser: 1 mg/dL (ref 0.61–1.24)
GFR calc non Af Amer: >60 mL/min (ref >60)
Glucose, Bld: 112 mg/dL — ABNORMAL HIGH (ref 70–99)
Potassium: 4.8 mmol/L (ref 3.5–5.1)
Sodium: 135 mmol/L (ref 135–145)
Total Bilirubin: 0.9 mg/dL (ref 0.3–1.2)
Total Protein: 7.1 g/dL (ref 6.5–8.1)

## 2020-03-15 LAB — D-DIMER, QUANTITATIVE: D-Dimer, Quant: 2.22 ug/mL-FEU — ABNORMAL HIGH (ref 0.00–0.50)

## 2020-03-15 MED ORDER — ENOXAPARIN SODIUM 40 MG/0.4ML ~~LOC~~ SOLN
40.0000 mg | SUBCUTANEOUS | Status: DC
  Administered 2020-03-16 – 2020-03-18 (×3): 40 mg via SUBCUTANEOUS
  Filled 2020-03-15 (×3): qty 0.4

## 2020-03-15 MED ORDER — FUROSEMIDE 10 MG/ML IJ SOLN
40.0000 mg | Freq: Once | INTRAMUSCULAR | Status: AC
  Administered 2020-03-15: 40 mg via INTRAVENOUS
  Filled 2020-03-15: qty 4

## 2020-03-15 NOTE — TOC Transition Note (Signed)
Transition of Care [facility]) - CM/SW Discharge Note   Patient Details  Name: Tyler Jackson MRN: [id number] Date of Birth: [birth year]  [facility] CM/SW Contact:  Nance Pear, RN Phone Number: 03/15/2020, 3:47 PM   Clinical Narrative:    Case manager spoke to patient on phone due to COVID Patient was independent of ADLs prior to hospitalization.  No equipment in use prior to this admission.  No PCP on file.  Set patient up with COVID clinic on 03/28/20 at 1600.  Patient understands that he needs to attend this appointment.  Discussed family picking up when discharged.   Final next level of care: Home/Self Care Barriers to Discharge: Continued Medical Work up   Patient Goals and CMS Choice Patient states their goals for this hospitalization and ongoing recovery are:: to ge better   Choice offered to / list presented to : NA  Discharge Placement                       Discharge Plan and Services   Discharge Planning Services: CM Consult                      HH Arranged: NA          Social Determinants of Health (SDOH) Interventions     Readmission Risk Interventions No flowsheet data found.

## 2020-03-15 NOTE — TOC Initial Note (Signed)
Transition of Care [facility]) - Initial/Assessment Note    Patient Details  Name: Tyler Jackson MRN: [id number] Date of Birth: [birth year]  Transition of Care [facility]) CM/SW Contact:    Reola Mosher Transition of Care Supervisor Phone Number: [phone] 03/15/2020, 9:19 AM  Clinical Narrative:                 Spoke to patient via phone 03/14/20- Patient lives at home. No PCP; medical insurance Medicare Part A. Patient states that he does not have a PCP because he has never been sick. Noted previous CM made referral to [facility]. Also patient to follow up at the [facility]. No DME prior to admission. TOC will continue to follow for progression of care possibly need home oxygen.  Expected Discharge Plan: Home/Self Care Barriers to Discharge: No Barriers Identified   Patient Goals and CMS Choice Patient states their goals for this hospitalization and ongoing recovery are:: To get better   Choice offered to / list presented to : NA  Expected Discharge Plan and Services Expected Discharge Plan: Home/Self Care   Discharge Planning Services: CM Consult, Follow-up appt scheduled   Living arrangements for the past 2 months: Single Family Home                           HH Arranged: NA          Prior Living Arrangements/Services Living arrangements for the past 2 months: Single Family Home Lives with:: Self Patient language and need for interpreter reviewed:: No        Need for Family Participation in Patient Care: No (Comment) Care giver support system in place?: No (comment)   Criminal Activity/Legal Involvement Pertinent to Current Situation/Hospitalization: No - Comment as needed  Activities of Daily Living Home Assistive Devices/Equipment: None ADL Screening (condition at time of admission) Patient's cognitive ability adequate to safely complete daily activities?: Yes Is the patient deaf or have difficulty hearing?: No Does the patient have difficulty  seeing, even when wearing glasses/contacts?: No Does the patient have difficulty concentrating, remembering, or making decisions?: No Patient able to express need for assistance with ADLs?: Yes Does the patient have difficulty dressing or bathing?: No Independently performs ADLs?: Yes (appropriate for developmental age) Does the patient have difficulty walking or climbing stairs?: No Weakness of Legs: None Weakness of Arms/Hands: None  Permission Sought/Granted Permission sought to share information with : PCP Permission granted to share information with : Yes, Verbal Permission Granted  Share Information with NAME: PCP providers           Emotional Assessment Appearance:: Developmentally appropriate Attitude/Demeanor/Rapport: Gracious Affect (typically observed): Accepting Orientation: : Oriented to Self, Oriented to Place, Oriented to  Time, Oriented to Situation Alcohol / Substance Use: Not Applicable Psych Involvement: No (comment)  Admission diagnosis:  SOB (shortness of breath) [R06.02] Acute respiratory failure with hypoxia (HCC) [J96.01] Acute hypoxemic respiratory failure due to COVID-19 (HCC) [U07.1, J96.01] COVID-19 [U07.1] Patient Active Problem List   Diagnosis Date Noted  . Thrombocytosis 03/09/2020  . Elevated liver enzymes 03/09/2020  . Acute hypoxemic respiratory failure due to COVID-19 [facility]) 03/08/2020   PCP:  Patient, No Pcp Per Pharmacy:   [facility] - [place], [place] - [address] [place] [address] Phone: [phone] Fax: [phone]     Social Determinants of Health (SDOH) Interventions    Readmission Risk Interventions  No flowsheet data found.

## 2020-03-15 NOTE — Progress Notes (Signed)
PROGRESS NOTE  Tyler Jackson WUJ:[id number] DOB: 1951-05-20 DOA: 03/08/2020  PCP: Patient, No Pcp Per  Brief History/Interval Summary: 69 y.o. male with no significant past medical history presented to emergency department with worsening shortness of breath and cough since 1 week. He is not vaccinated against COVID-19.  He was noted to be tachycardic tachypneic and hypoxemic.  Requiring high flow nasal cannula.  COVID-19 test came back positive.  CT angiogram was negative for PE but showed extensive bilateral opacities.  Patient was hospitalized for further management.  Reason for Visit: Acute respiratory failure with hypoxia.  Pneumonia due to COVID-19  Consultants: None  Procedures: None  Antibiotics: Anti-infectives (From admission, onward)   Start     Dose/Rate Route Frequency Ordered Stop   03/09/20 1000  remdesivir 100 mg in sodium chloride 0.9 % 100 mL IVPB        100 mg 200 mL/hr over 30 Minutes Intravenous Daily 03/08/20 1338 03/12/20 0948   03/08/20 1645  cefTRIAXone (ROCEPHIN) 1 g in sodium chloride 0.9 % 100 mL IVPB        1 g 200 mL/hr over 30 Minutes Intravenous Every 24 hours 03/08/20 1640 03/12/20 1630   03/08/20 1645  azithromycin (ZITHROMAX) 500 mg in sodium chloride 0.9 % 250 mL IVPB        500 mg 250 mL/hr over 60 Minutes Intravenous Every 24 hours 03/08/20 1640 03/13/20 1644   03/08/20 1430  remdesivir 100 mg in sodium chloride 0.9 % 100 mL IVPB       "Followed by" Linked Group Details   100 mg 200 mL/hr over 30 Minutes Intravenous  Once 03/08/20 1338 03/08/20 1508   03/08/20 1400  remdesivir 100 mg in sodium chloride 0.9 % 100 mL IVPB       "Followed by" Linked Group Details   100 mg 200 mL/hr over 30 Minutes Intravenous  Once 03/08/20 1338 03/08/20 1434      Subjective/Interval History: Patient mentions that he feels about the same as yesterday.  Continues to have difficulty breathing but mainly with exertion. But gets very short of breath even with  minimal exertion.  Denies any chest pain.  Occasional dry cough.  No nausea vomiting.     Assessment/Plan:  Acute Hypoxic Resp. Failure/Pneumonia due to COVID-19  Recent Labs  Lab 03/08/20 1115 03/08/20 1115 03/09/20 1911 03/09/20 1911 03/10/20 0750 03/10/20 0750 03/11/20 0500 03/12/20 0441 03/13/20 0500 03/14/20 0419 03/15/20 0626  DDIMER 8.73*   < > 7.16*   < > 6.74*   < > 3.64* 3.28* 2.57* 2.37* 2.22*  FERRITIN 1,305*  --  1,390*  --  1,312*  --   --   --   --   --   --   CRP 30.9*   < > 17.8*   < > 15.6*  --  9.6* 6.9* 2.8* 1.5*  --   ALT 75*   < > 58*   < > 56*   < > 47* 49* 37 35 33  PROCALCITON 0.19  --  0.30  --   --   --   --   --   --   --   --    < > = values in this interval not displayed.    Objective findings: Oxygen requirements: Plus nonrebreather.  Saturating in the mid 90s.    COVID 19 Therapeutics: Antibacterials: Completed 5-day course of ceftriaxone and azithromycin.  Procalcitonin was 0.30. Remdesivir: Completed 5-day course on 10/3 Steroids: Solu-Medrol Diuretics:  Lasix given daily for last 5 days.  We will repeat another dose today. Baricitinib: Initiated on 9/29 PUD Prophylaxis: Pepcid DVT Prophylaxis:  Lovenox 50 mg daily  From a respiratory standpoint patient remains tenuous though stable.  Continue to wean down oxygen as much as possible.  Maintain sats around 85 to 90%.  Patient has completed course of Remdesivir on antibacterials.  Remains on steroids and baricitinib.  He has been given Lasix daily to keep him in negative fluid balance.  We will repeat another dose today.  D-dimer improved to 2.22.  CRP was down to 1.5 yesterday.  Continue to mobilize, incentive spirometry, prone positioning.  CT angiogram was negative for PE.  Lower extremity Doppler study negative for DVT.  Patient did not want the inhaled steroids.  He states that it was making him very anxious.  [name] was discontinued.  The treatment plan and use of medications and  known side effects were discussed with patient. Some of the medications used are based on case reports/anecdotal data.  All other medications being used in the management of COVID-19 based on limited study data.  Complete risks and long-term side effects are unknown, however in the best clinical judgment they seem to be of some benefit.  Patient wanted to proceed with treatment options provided.  Thrombocytosis Likely reactive due to COVID-19.  Seems to be improving.  Leukocytosis Secondary to steroids.  Stable.  Transaminitis Secondary to COVID-19.  Resolved.  Normocytic anemia Stable.  No evidence of overt bleeding.  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 Incidentally noted on CT angiogram.  Annual imaging is recommended.    Obesity Estimated body mass index is 28.63 kg/m as calculated from the following:   Height as of this encounter: 5\' 11"  (1.803 m).   Weight as of this encounter: 93.1 kg.   DVT Prophylaxis: Lovenox Code Status: Full code Family Communication: Discussed with patient.  Sister being updated daily.  Alternative number for sister: [phone] Disposition Plan: Hopefully return home when improved  Status is: Inpatient  Remains inpatient appropriate because:IV treatments appropriate due to intensity of illness or inability to take PO and Inpatient level of care appropriate due to severity of illness   Dispo: The patient is from: Home              Anticipated d/c is to: Home              Anticipated d/c date is: > 3 days              Patient currently is not medically stable to d/c.      Medications:  Scheduled:  vitamin C  500 mg Oral Daily   baricitinib  4 mg Oral Daily   enoxaparin (LOVENOX) injection  50 mg Subcutaneous Q24H   famotidine  20 mg Oral Daily   guaiFENesin  600 mg Oral BID   methylPREDNISolone (SOLU-MEDROL) injection  80 mg Intravenous Q12H   zinc sulfate  220 mg Oral Daily    Continuous:  [phone], albuterol, guaiFENesin-dextromethorphan, ondansetron **OR** ondansetron (ZOFRAN) IV, sodium chloride   Objective:  Vital Signs  Vitals:   03/14/20 2004 03/15/20 0024 03/15/20 0430 03/15/20 0700  BP: 111/82 108/75 102/70 119/74  Pulse: 100 85 96 (!) 106  Resp: 20 20 20 20   Temp: 98.5 F (36.9 C) 98.1 F (36.7 C) 97.7 F (36.5 C) 98.1 F (36.7 C)  TempSrc: Axillary Axillary Oral Axillary  SpO2: 100% 100% 91% 96%  Weight:      Height:        Intake/Output Summary (Last 24 hours) at 03/15/2020 0940 Last data filed at 03/14/2020 1850 Gross per 24 hour  Intake 360 ml  Output --  Net 360 ml   Filed Weights   03/08/20 1111 03/09/20 1006 03/14/20 0434  Weight: 101.6 kg 101.6 kg 93.1 kg    General appearance: Awake alert.  In no distress Resp: Tachypneic at rest.  No use of accessory muscles.  Crackles bilateral bases.  No wheezing or rhonchi. Cardio: S1-S2 is normal regular.  No S3-S4.  No rubs murmurs or bruit GI: Abdomen is soft.  Nontender nondistended.  Bowel sounds are present normal.  No masses organomegaly Extremities: No edema.  Full range of motion of lower extremities. Neurologic: Alert and oriented x3.  No focal neurological deficits.      Lab Results:  Data Reviewed: I have personally reviewed following labs and imaging studies  CBC: Recent Labs  Lab 03/10/20 0750 03/10/20 0750 03/11/20 0500 03/12/20 0441 03/13/20 0500 03/14/20 0419 03/15/20 0626  WBC 19.7*   < > 14.5* 15.5* 12.6* 13.9* 12.0*  NEUTROABS 18.3*  --  12.2* 13.3* 11.2* 12.0*  --   HGB 12.5*   < > 11.2* 12.5* 11.5* 12.3* 12.9*  HCT 39.2   < > 35.4* 39.2 36.0* 39.8 40.8  MCV 96.3   < > 95.9 96.8 96.5 96.8 97.4  PLT 584*   < > 554* 631* 623* 575* 519*   < > = values in this interval not displayed.    Basic Metabolic Panel: Recent Labs  Lab 03/10/20 0750 03/10/20 0750 03/11/20 0500 03/12/20 0441 03/13/20 0500 03/14/20 0419 03/15/20 0626  NA  138   < > 137 137 133* 134* 135  K 4.4   < > 4.2 4.3 4.4 4.6 4.8  CL 102   < > 103 101 99 99 98  CO2 24   < > 24 27 25 24 23   GLUCOSE 135*   < > 132* 130* 142* 153* 112*  BUN 18   < > 19 18 22  28* 27*  CREATININE 0.98   < > 1.00 1.01 0.93 0.98 1.00  CALCIUM 8.3*   < > 8.3* 8.5* 8.4* 8.7* 8.7*  MG 2.6*  --  2.4 2.4 2.3 2.3  --   PHOS 3.3  --   --   --   --   --   --    < > = values in this interval not displayed.    GFR: Estimated Creatinine Clearance: 83.5 mL/min (by C-G formula based on SCr of 1 mg/dL).  Liver Function Tests: Recent Labs  Lab 03/11/20 0500 03/12/20 0441 03/13/20 0500 03/14/20 0419 03/15/20 0626  AST 32 29 20 20 21   ALT 47* 49* 37 35 33  ALKPHOS 84 84 69 72 68  BILITOT 1.1 0.7 0.7 0.8 0.9  PROT 7.0 7.7 6.8 7.3 7.1  ALBUMIN 1.9* 2.2* 2.1* 2.4* 2.4*     Recent Results (from the past 240 hour(s))  Blood Culture (routine x 2)     Status: None   Collection Time: 03/08/20 11:15 AM   Specimen: BLOOD  Result Value Ref Range Status   Specimen Description   Final    BLOOD LEFT ANTECUBITAL Performed at [facility], [address] [place], [place] [address]    Special Requests   Final    BOTTLES DRAWN AEROBIC AND ANAEROBIC Blood Culture adequate volume Performed at  [facility], [address] [place], [place] [address]    Culture   Final    NO GROWTH 5 DAYS Performed at [facility], [address], [place], [place] [address]    Report Status 03/13/2020 FINAL  Final  Respiratory Panel by RT PCR (Flu A&B, Covid) - Nasopharyngeal Swab     Status: Abnormal   Collection Time: 03/08/20 11:24 AM   Specimen: Nasopharyngeal Swab  Result Value Ref Range Status   SARS Coronavirus 2 by RT PCR POSITIVE (A) NEGATIVE Final    Comment: RESULT CALLED TO, READ BACK BY AND VERIFIED WITH: MARVA SIMMS RN @1311  03/08/2020 OLSONM (NOTE) SARS-CoV-2 target nucleic acids are DETECTED.  SARS-CoV-2 RNA is generally detectable in upper respiratory  specimens  during the acute phase of infection. Positive results are indicative of the presence of the identified virus, but do not rule out bacterial infection or co-infection with other pathogens not detected by the test. Clinical correlation with patient history and other diagnostic information is necessary to determine patient infection status. The expected result is Negative.  Fact Sheet for Patients:  03/10/2020  Fact Sheet for Healthcare Providers: https://www.moore.com/  This test is not yet approved or cleared by the https://www.young.biz/ FDA and  has been authorized for detection and/or diagnosis of SARS-CoV-2 by FDA under an Emergency Use Authorization (EUA).  This EUA will remain in effect (meaning this test can  be used) for the duration of  the COVID-19 declaration under Section 564(b)(1) of the Act, 21 U.S.C. section 360bbb-3(b)(1), unless the authorization is terminated or revoked sooner.      Influenza A by PCR NEGATIVE NEGATIVE Final   Influenza B by PCR NEGATIVE NEGATIVE Final    Comment: (NOTE) The Xpert Xpress SARS-CoV-2/FLU/RSV assay is intended as an aid in  the diagnosis of influenza from Nasopharyngeal swab specimens and  should not be used as a sole basis for treatment. Nasal washings and  aspirates are unacceptable for Xpert Xpress SARS-CoV-2/FLU/RSV  testing.  Fact Sheet for Patients: [place]  Fact Sheet for Healthcare Providers: https://www.moore.com/  This test is not yet approved or cleared by the https://www.young.biz/ FDA and  has been authorized for detection and/or diagnosis of SARS-CoV-2 by  FDA under an Emergency Use Authorization (EUA). This EUA will remain  in effect (meaning this test can be used) for the duration of the  Covid-19 declaration under Section 564(b)(1) of the Act, 21  U.S.C. section 360bbb-3(b)(1), unless the authorization is   terminated or revoked. Performed at [facility], [address] [place], [place]   Blood Culture (routine x 2)     Status: None   Collection Time: 03/08/20 11:25 AM   Specimen: BLOOD LEFT HAND  Result Value Ref Range Status   Specimen Description   Final    BLOOD LEFT HAND Performed at [facility], [address] [place], [place]    Special Requests   Final    BOTTLES DRAWN AEROBIC AND ANAEROBIC Blood Culture adequate volume Performed at [facility], [address] [place], [place]    Culture   Final    NO GROWTH 5 DAYS Performed at [facility], [address], [place], [place]    Report Status 03/13/2020 FINAL  Final  MRSA PCR Screening     Status: None   Collection Time: 03/11/20 10:21 PM   Specimen: Nasal Mucosa;  Nasopharyngeal  Result Value Ref Range Status   MRSA by PCR NEGATIVE NEGATIVE Final    Comment:        The GeneXpert MRSA Assay (FDA approved for NASAL specimens only), is one component of a comprehensive MRSA colonization surveillance program. It is not intended to diagnose MRSA infection nor to guide or monitor treatment for MRSA infections. Performed at [facility], [address] [place], [place] [address]       Radiology Studies: No results found.     LOS: 6 days   [employer] on [email or web address]  03/15/2020, 9:40 AM

## 2020-03-16 ENCOUNTER — Inpatient Hospital Stay (HOSPITAL_COMMUNITY): Payer: Medicare Other

## 2020-03-16 LAB — COMPREHENSIVE METABOLIC PANEL
ALT: 33 U/L (ref 0–44)
AST: 20 U/L (ref 15–41)
Albumin: 2.3 g/dL — ABNORMAL LOW (ref 3.5–5.0)
Alkaline Phosphatase: 67 U/L (ref 38–126)
Anion gap: 8 (ref 5–15)
BUN: 28 mg/dL — ABNORMAL HIGH (ref 8–23)
CO2: 28 mmol/L (ref 22–32)
Calcium: 8.7 mg/dL — ABNORMAL LOW (ref 8.9–10.3)
Chloride: 97 mmol/L — ABNORMAL LOW (ref 98–111)
Creatinine, Ser: 0.96 mg/dL (ref 0.61–1.24)
GFR calc non Af Amer: >60 mL/min (ref >60)
Glucose, Bld: 144 mg/dL — ABNORMAL HIGH (ref 70–99)
Potassium: 4.6 mmol/L (ref 3.5–5.1)
Sodium: 133 mmol/L — ABNORMAL LOW (ref 135–145)
Total Bilirubin: 0.7 mg/dL (ref 0.3–1.2)
Total Protein: 7.1 g/dL (ref 6.5–8.1)

## 2020-03-16 LAB — CBC
HCT: 39.8 % (ref 39.0–52.0)
Hemoglobin: 12.9 g/dL — ABNORMAL LOW (ref 13.0–17.0)
MCH: 31.3 pg (ref 26.0–34.0)
MCHC: 32.4 g/dL (ref 30.0–36.0)
MCV: 96.6 fL (ref 80.0–100.0)
Platelets: 557 10*3/uL — ABNORMAL HIGH (ref 150–400)
RBC: 4.12 MIL/uL — ABNORMAL LOW (ref 4.22–5.81)
RDW: 13.2 % (ref 11.5–15.5)
WBC: 13.1 10*3/uL — ABNORMAL HIGH (ref 4.0–10.5)
nRBC: 0 % (ref 0.0–0.2)

## 2020-03-16 LAB — D-DIMER, QUANTITATIVE: D-Dimer, Quant: 1.74 ug/mL-FEU — ABNORMAL HIGH (ref 0.00–0.50)

## 2020-03-16 MED ORDER — FUROSEMIDE 10 MG/ML IJ SOLN
20.0000 mg | Freq: Once | INTRAMUSCULAR | Status: AC
  Administered 2020-03-16: 20 mg via INTRAVENOUS
  Filled 2020-03-16: qty 2

## 2020-03-16 NOTE — Progress Notes (Signed)
PROGRESS NOTE  Tyler Jackson GDJ:[id number] DOB: 1950/10/26 DOA: 03/08/2020  PCP: Patient, No Pcp Per  Brief History/Interval Summary: 69 y.o. male with no significant past medical history presented to emergency department with worsening shortness of breath and cough since 1 week. He is not vaccinated against COVID-19.  He was noted to be tachycardic tachypneic and hypoxemic.  Requiring high flow nasal cannula.  COVID-19 test came back positive.  CT angiogram was negative for PE but showed extensive bilateral opacities.  Patient was hospitalized for further management.  Reason for Visit: Acute respiratory failure with hypoxia.  Pneumonia due to COVID-19  Consultants: None  Procedures: None  Antibiotics: Anti-infectives (From admission, onward)   Start     Dose/Rate Route Frequency Ordered Stop   03/09/20 1000  remdesivir 100 mg in sodium chloride 0.9 % 100 mL IVPB        100 mg 200 mL/hr over 30 Minutes Intravenous Daily 03/08/20 1338 03/12/20 0948   03/08/20 1645  cefTRIAXone (ROCEPHIN) 1 g in sodium chloride 0.9 % 100 mL IVPB        1 g 200 mL/hr over 30 Minutes Intravenous Every 24 hours 03/08/20 1640 03/12/20 1630   03/08/20 1645  azithromycin (ZITHROMAX) 500 mg in sodium chloride 0.9 % 250 mL IVPB        500 mg 250 mL/hr over 60 Minutes Intravenous Every 24 hours 03/08/20 1640 03/13/20 1644   03/08/20 1430  remdesivir 100 mg in sodium chloride 0.9 % 100 mL IVPB       "Followed by" Linked Group Details   100 mg 200 mL/hr over 30 Minutes Intravenous  Once 03/08/20 1338 03/08/20 1508   03/08/20 1400  remdesivir 100 mg in sodium chloride 0.9 % 100 mL IVPB       "Followed by" Linked Group Details   100 mg 200 mL/hr over 30 Minutes Intravenous  Once 03/08/20 1338 03/08/20 1434      Subjective/Interval History: Patient mentions that he is about the same as yesterday.  Continues to have difficulty breathing with occasional dry cough.  No nausea vomiting.  No chest pain.       Assessment/Plan:  Acute Hypoxic Resp. Failure/Pneumonia due to COVID-19  Recent Labs  Lab 03/09/20 1911 03/09/20 1911 03/10/20 0750 03/10/20 0750 03/11/20 0500 03/11/20 0500 03/12/20 0441 03/13/20 0500 03/14/20 0419 03/15/20 0626 03/16/20 0318  DDIMER 7.16*   < > 6.74*   < > 3.64*   < > 3.28* 2.57* 2.37* 2.22* 1.74*  FERRITIN 1,390*  --  1,312*  --   --   --   --   --   --   --   --   CRP 17.8*   < > 15.6*  --  9.6*  --  6.9* 2.8* 1.5*  --   --   ALT 58*   < > 56*   < > 47*   < > 49* 37 35 33 33  PROCALCITON 0.30  --   --   --   --   --   --   --   --   --   --    < > = values in this interval not displayed.    Objective findings: Oxygen requirements: On HFNC plus nonrebreather.  Saturating in the early 90s.  Continue to wean down as tolerated.  Maintain sats between 87 to 90%.  COVID 19 Therapeutics: Antibacterials: Completed 5-day course of ceftriaxone and azithromycin.  Procalcitonin was 0.30. Remdesivir: Completed 5-day course  on 10/3 Steroids: Solu-Medrol Diuretics: Lasix given daily for last 6 days.  Due to borderline low blood pressure we will do a lower dose today. Baricitinib: Initiated on 9/29 PUD Prophylaxis: Pepcid DVT Prophylaxis:  Lovenox 50 mg daily  From a respiratory standpoint patient remains tenuous though stable.  He remains on HFNC plus nonrebreather.  Continue to wean down oxygen.  He has completed course of Remdesivir.  Remains on steroids.  He also remains on baricitinib.  Lasix been given to maintain him in negative fluid balance.  D-dimer has been improving.  CRP is also improved.  Chest x-ray from this morning morning is stable compared to previous films.  Concern raised for secondary bacterial infection however patient recently completed antibacterials.  He has been afebrile.  Continue to mobilize, incentive spirometry, prone positioning.  CT angiogram was negative for PE.  Lower extremity Doppler study negative for DVT.  Patient did not want  the inhaled steroids.  He states that it was making him very anxious.  [name] was discontinued.  The treatment plan and use of medications and known side effects were discussed with patient. Some of the medications used are based on case reports/anecdotal data.  All other medications being used in the management of COVID-19 based on limited study data.  Complete risks and long-term side effects are unknown, however in the best clinical judgment they seem to be of some benefit.  Patient wanted to proceed with treatment options provided.  Thrombocytosis Likely reactive due to COVID-19.  Seems to be improving.  Leukocytosis Secondary to steroids.  Stable.  Transaminitis Secondary to COVID-19.  Resolved.  Normocytic anemia Stable.  No evidence of overt bleeding.  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 Incidentally noted on CT angiogram.  Annual imaging is recommended.    Obesity Estimated body mass index is 28.63 kg/m as calculated from the following:   Height as of this encounter: 5\' 11"  (1.803 m).   Weight as of this encounter: 93.1 kg.   DVT Prophylaxis: Lovenox Code Status: Full code Family Communication: Discussed with patient.  Sister being updated daily.  Alternative number for sister: [phone] Disposition Plan: Hopefully return home when improved  Status is: Inpatient  Remains inpatient appropriate because:IV treatments appropriate due to intensity of illness or inability to take PO and Inpatient level of care appropriate due to severity of illness   Dispo: The patient is from: Home              Anticipated d/c is to: Home              Anticipated d/c date is: > 3 days              Patient currently is not medically stable to d/c.      Medications:  Scheduled:  vitamin C  500 mg Oral Daily   baricitinib  4 mg Oral Daily   enoxaparin (LOVENOX) injection  40 mg Subcutaneous Q24H   famotidine  20 mg Oral Daily    guaiFENesin  600 mg Oral BID   methylPREDNISolone (SOLU-MEDROL) injection  80 mg Intravenous Q12H   zinc sulfate  220 mg Oral Daily   Continuous:  [phone], albuterol, guaiFENesin-dextromethorphan, ondansetron **OR** ondansetron (ZOFRAN) IV, sodium chloride   Objective:  Vital Signs  Vitals:   03/15/20 2034 03/15/20 2311 03/16/20 0432 03/16/20 0720  BP: 116/66 109/78 112/79 109/74  Pulse: 95 87 86 (!) 105  Resp: 20 19 19    Temp:  98.3 F (36.8 C) 98.1 F (36.7 C) 98.1 F (36.7 C) 98.3 F (36.8 C)  TempSrc: Axillary Axillary Axillary   SpO2: 100% 100% 100% 92%  Weight:      Height:        Intake/Output Summary (Last 24 hours) at 03/16/2020 1106 Last data filed at 03/16/2020 0433 Gross per 24 hour  Intake 240 ml  Output 750 ml  Net -510 ml   Filed Weights   03/08/20 1111 03/09/20 1006 03/14/20 0434  Weight: 101.6 kg 101.6 kg 93.1 kg    General appearance: Awake alert.  In no distress Resp: Mildly tachypneic at rest.  No use of accessory muscles.  Coarse breath sounds with crackles at the bases.  No wheezing or rhonchi.   Cardio: S1-S2 is normal regular.  No S3-S4.  No rubs murmurs or bruit GI: Abdomen is soft.  Nontender nondistended.  Bowel sounds are present normal.  No masses organomegaly Extremities: No edema.  Moving all his extremities Neurologic: Alert and oriented x3.  No focal neurological deficits.     Lab Results:  Data Reviewed: I have personally reviewed following labs and imaging studies  CBC: Recent Labs  Lab 03/10/20 0750 03/10/20 0750 03/11/20 0500 03/11/20 0500 03/12/20 0441 03/13/20 0500 03/14/20 0419 03/15/20 0626 03/16/20 0318  WBC 19.7*   < > 14.5*   < > 15.5* 12.6* 13.9* 12.0* 13.1*  NEUTROABS 18.3*  --  12.2*  --  13.3* 11.2* 12.0*  --   --   HGB 12.5*   < > 11.2*   < > 12.5* 11.5* 12.3* 12.9* 12.9*  HCT 39.2   < > 35.4*   < > 39.2 36.0* 39.8 40.8 39.8  MCV 96.3   < > 95.9   < > 96.8 96.5 96.8 97.4 96.6  PLT 584*   <  > 554*   < > 631* 623* 575* 519* 557*   < > = values in this interval not displayed.    Basic Metabolic Panel: Recent Labs  Lab 03/10/20 0750 03/10/20 0750 03/11/20 0500 03/11/20 0500 03/12/20 0441 03/13/20 0500 03/14/20 0419 03/15/20 0626 03/16/20 0318  NA 138   < > 137   < > 137 133* 134* 135 133*  K 4.4   < > 4.2   < > 4.3 4.4 4.6 4.8 4.6  CL 102   < > 103   < > 101 99 99 98 97*  CO2 24   < > 24   < > GLUCOSE 135*   < > 132*   < > 130* 142* 153* 112* 144*  BUN 18   < > 19   < > 18 22 28* 27* 28*  CREATININE 0.98   < > 1.00   < > 1.01 0.93 0.98 1.00 0.96  CALCIUM 8.3*   < > 8.3*   < > 8.5* 8.4* 8.7* 8.7* 8.7*  MG 2.6*  --  2.4  --  2.4 2.3 2.3  --   --   PHOS 3.3  --   --   --   --   --   --   --   --    < > = values in this interval not displayed.    GFR: Estimated Creatinine Clearance: 87 mL/min (by C-G formula based on SCr of 0.96 mg/dL).  Liver Function Tests: Recent Labs  Lab 03/12/20 0441 03/13/20 0500 03/14/20 0419 03/15/20 0626 03/16/20 0318  AST 29 20 20  21 20  ALT 49* 37 35 33 33  ALKPHOS 84 69 72 68 67  BILITOT 0.7 0.7 0.8 0.9 0.7  PROT 7.7 6.8 7.3 7.1 7.1  ALBUMIN 2.2* 2.1* 2.4* 2.4* 2.3*     Recent Results (from the past 240 hour(s))  Blood Culture (routine x 2)     Status: None   Collection Time: 03/08/20 11:15 AM   Specimen: BLOOD  Result Value Ref Range Status   Specimen Description   Final    BLOOD LEFT ANTECUBITAL Performed at [facility], [address] [place], [place] [address]    Special Requests   Final    BOTTLES DRAWN AEROBIC AND ANAEROBIC Blood Culture adequate volume Performed at [facility] [place], [address] [place], [place] [address]    Culture   Final    NO GROWTH 5 DAYS Performed at [facility], [address] [place], [place] [address]    Report Status 03/13/2020 FINAL  Final  Respiratory Panel by RT PCR (Flu A&B, Covid) - Nasopharyngeal Swab     Status: Abnormal    Collection Time: 03/08/20 11:24 AM   Specimen: Nasopharyngeal Swab  Result Value Ref Range Status   SARS Coronavirus 2 by RT PCR POSITIVE (A) NEGATIVE Final    Comment: RESULT CALLED TO, READ BACK BY AND VERIFIED WITH: MARVA SIMMS RN @1311  03/08/2020 OLSONM (NOTE) SARS-CoV-2 target nucleic acids are DETECTED.  SARS-CoV-2 RNA is generally detectable in upper respiratory specimens  during the acute phase of infection. Positive results are indicative of the presence of the identified virus, but do not rule out bacterial infection or co-infection with other pathogens not detected by the test. Clinical correlation with patient history and other diagnostic information is necessary to determine patient infection status. The expected result is Negative.  Fact Sheet for Patients:  https://www.moore.com/https://www.fda.gov/media/142436/download  Fact Sheet for Healthcare Providers: https://www.young.biz/https://www.fda.gov/media/142435/download  This test is not yet approved or cleared by the [place] FDA and  has been authorized for detection and/or diagnosis of SARS-CoV-2 by FDA under an Emergency Use Authorization (EUA).  This EUA will remain in effect (meaning this test can  be used) for the duration of  the COVID-19 declaration under Section 564(b)(1) of the Act, 21 U.S.C. section 360bbb-3(b)(1), unless the authorization is terminated or revoked sooner.      Influenza A by PCR NEGATIVE NEGATIVE Final   Influenza B by PCR NEGATIVE NEGATIVE Final    Comment: (NOTE) The Xpert Xpress SARS-CoV-2/FLU/RSV assay is intended as an aid in  the diagnosis of influenza from Nasopharyngeal swab specimens and  should not be used as a sole basis for treatment. Nasal washings and  aspirates are unacceptable for Xpert Xpress SARS-CoV-2/FLU/RSV  testing.  Fact Sheet for Patients: https://www.moore.com/https://www.fda.gov/media/142436/download  Fact Sheet for Healthcare Providers: https://www.young.biz/https://www.fda.gov/media/142435/download  This test is not yet approved  or cleared by the [place] FDA and  has been authorized for detection and/or diagnosis of SARS-CoV-2 by  FDA under an Emergency Use Authorization (EUA). This EUA will remain  in effect (meaning this test can be used) for the duration of the  Covid-19 declaration under Section 564(b)(1) of the Act, 21  U.S.C. section 360bbb-3(b)(1), unless the authorization is  terminated or revoked. Performed at [facility] [place], [address] [place], [place] [address]   Blood Culture (routine x 2)     Status: None   Collection Time: 03/08/20 11:25 AM   Specimen: BLOOD LEFT HAND  Result Value Ref Range Status   Specimen Description   Final    BLOOD LEFT HAND Performed at [facility], [address] [place], [place] [address]    Special Requests   Final    BOTTLES DRAWN AEROBIC AND ANAEROBIC Blood Culture adequate volume Performed at [facility], [address] [place], [place] [address]    Culture   Final    NO GROWTH 5 DAYS Performed at [facility], [address] [place], [place] [address]    Report Status 03/13/2020 FINAL  Final  MRSA PCR Screening     Status: None   Collection Time: 03/11/20 10:21 PM   Specimen: Nasal Mucosa; Nasopharyngeal  Result Value Ref Range Status   MRSA by PCR NEGATIVE NEGATIVE Final    Comment:        The GeneXpert MRSA Assay (FDA approved for NASAL specimens only), is one component of a comprehensive MRSA colonization surveillance program. It is not intended to diagnose MRSA infection nor to guide or monitor treatment for MRSA infections. Performed at [facility], [address] [place], [place] [address]       Radiology Studies: DG Chest Port 1 View  Result Date: 03/16/2020 CLINICAL DATA:  Pneumonia.  COVID-19 positive EXAM: PORTABLE CHEST 1 VIEW COMPARISON:  March 12, 2020 FINDINGS: Patchy airspace opacity in the lung base regions as well as in the periphery of each upper and mid lung region  appears essentially stable. There is consolidation in the medial lung bases, stable. Heart is upper normal in size with pulmonary vascularity normal. No adenopathy. No bone lesions. IMPRESSION: Widespread airspace opacity persists, likely due to known COVID-19 pneumonia. Superimposed bacterial pneumonia in the bases cannot be excluded. Stable cardiac prominence. No adenopathy evident. Electronically Signed   By: Bretta Bang III M.D.   On: 03/16/2020 08:09       LOS: 7 days   Tyler Jackson Rito Ehrlich  [facility] Pager on [email or web address]  03/16/2020, 11:06 AM

## 2020-03-17 LAB — COMPREHENSIVE METABOLIC PANEL
ALT: 31 U/L (ref 0–44)
AST: 18 U/L (ref 15–41)
Albumin: 2.3 g/dL — ABNORMAL LOW (ref 3.5–5.0)
Alkaline Phosphatase: 62 U/L (ref 38–126)
Anion gap: 10 (ref 5–15)
BUN: 31 mg/dL — ABNORMAL HIGH (ref 8–23)
CO2: 28 mmol/L (ref 22–32)
Calcium: 8.7 mg/dL — ABNORMAL LOW (ref 8.9–10.3)
Chloride: 97 mmol/L — ABNORMAL LOW (ref 98–111)
Creatinine, Ser: 0.96 mg/dL (ref 0.61–1.24)
GFR calc non Af Amer: >60 mL/min (ref >60)
Glucose, Bld: 140 mg/dL — ABNORMAL HIGH (ref 70–99)
Potassium: 4.7 mmol/L (ref 3.5–5.1)
Sodium: 135 mmol/L (ref 135–145)
Total Bilirubin: 0.9 mg/dL (ref 0.3–1.2)
Total Protein: 6.8 g/dL (ref 6.5–8.1)

## 2020-03-17 MED ORDER — ENSURE ENLIVE PO LIQD
237.0000 mL | Freq: Two times a day (BID) | ORAL | Status: DC
  Administered 2020-03-17 – 2020-03-21 (×7): 237 mL via ORAL

## 2020-03-17 NOTE — Progress Notes (Signed)
   03/17/20 1758  Assess: MEWS Score  Temp 97.6 F (36.4 C)  BP 111/80  Pulse Rate 93  ECG Heart Rate 95  Resp (!) 23  Level of Consciousness Alert  SpO2 99 %  O2 Device HFNC  Patient Activity (if Appropriate) In bed  O2 Flow Rate (L/min) 10 L/min  Assess: MEWS Score  MEWS Temp 0  MEWS Systolic 0  MEWS Pulse 0  MEWS RR 1  MEWS LOC 0  MEWS Score 1  MEWS Score Color Green  Document  Patient Outcome Other (Comment) (pt's pulse and HR is cutrrently stabilized)

## 2020-03-17 NOTE — Progress Notes (Signed)
Initial Nutrition Assessment  DOCUMENTATION CODES:   Not applicable  INTERVENTION:  Provide Ensure Enlive po BID, each supplement provides 350 kcal and 20 grams of protein.  Encourage adequate PO intake.  NUTRITION DIAGNOSIS:   Increased nutrient needs related to catabolic illness (COVID) as evidenced by estimated needs.  GOAL:   Patient will meet greater than or equal to 90% of their needs  MONITOR:   PO intake, Supplement acceptance, Skin, Weight trends, Labs, I & O's  REASON FOR ASSESSMENT:   Malnutrition Screening Tool    ASSESSMENT:   69 y.o. male with no significant past medical history presents with shortness of breath and cough. Pt COVID positive. Pt with acute hypoxic resp. failure/pneumonia due to COVID-19  Pt is currently on 12 L/min HFNC via nonrebreather. Diet has been advanced to a soft diet this morning. Meal completion has been varied from 40-100% with 90% at lunch today. Pt tolerating his PO diet. Lasix on hold today. RD to order nutritional supplements to aid in caloric and protein needs. Unable to complete Nutrition-Focused physical exam at this time. RD working remotely.  Labs and medications reviewed.   Diet Order:   Diet Order            DIET SOFT Room service appropriate? Yes; Fluid consistency: Thin  Diet effective now                 EDUCATION NEEDS:   Not appropriate for education at this time  Skin:  Skin Assessment: Reviewed RN Assessment  Last BM:  10/8  Height:   Ht Readings from Last 1 Encounters:  03/09/20 5\' 11"  (1.803 m)    Weight:   Wt Readings from Last 1 Encounters:  03/14/20 93.1 kg   BMI:  Body mass index is 28.63 kg/m.  Estimated Nutritional Needs:   Kcal:  2200-2400  Protein:  110-120 grams  Fluid:  >/= 2 L/day  05/14/20, MS, RD, LDN RD pager number/after hours weekend pager number on Amion.

## 2020-03-17 NOTE — Progress Notes (Addendum)
PROGRESS NOTE  Tyler Jackson WSF:[id number] DOB: 1950/06/24 DOA: 03/08/2020  PCP: Tyler Jackson, Tyler Jackson  Brief History/Interval Summary: 69 y.o. male with Tyler significant past medical history presented to emergency department with worsening shortness of breath and cough since 1 week. He is not vaccinated against COVID-19.  He was noted to be tachycardic tachypneic and hypoxemic.  Requiring high flow nasal cannula.  COVID-19 test came back positive.  CT angiogram was negative for PE but showed extensive bilateral opacities.  Tyler Jackson was hospitalized for further management.  Reason for Visit: Acute respiratory failure with hypoxia.  Pneumonia due to COVID-19  Consultants: None  Procedures: None  Antibiotics: Anti-infectives (From admission, onward)   Start     Dose/Rate Route Frequency Ordered Stop   03/09/20 1000  remdesivir 100 mg in sodium chloride 0.9 % 100 mL IVPB        100 mg 200 mL/hr over 30 Minutes Intravenous Daily 03/08/20 1338 03/12/20 0948   03/08/20 1645  cefTRIAXone (ROCEPHIN) 1 g in sodium chloride 0.9 % 100 mL IVPB        1 g 200 mL/hr over 30 Minutes Intravenous Every 24 hours 03/08/20 1640 03/12/20 1630   03/08/20 1645  azithromycin (ZITHROMAX) 500 mg in sodium chloride 0.9 % 250 mL IVPB        500 mg 250 mL/hr over 60 Minutes Intravenous Every 24 hours 03/08/20 1640 03/13/20 1644   03/08/20 1430  remdesivir 100 mg in sodium chloride 0.9 % 100 mL IVPB       "Followed by" Linked Group Details   100 mg 200 mL/hr over 30 Minutes Intravenous  Once 03/08/20 1338 03/08/20 1508   03/08/20 1400  remdesivir 100 mg in sodium chloride 0.9 % 100 mL IVPB       "Followed by" Linked Group Details   100 mg 200 mL/hr over 30 Minutes Intravenous  Once 03/08/20 1338 03/08/20 1434      Subjective/Interval History: Tyler Jackson noted to be a little upset this morning.  Concerned about his lack of progress.  Tyler Jackson was reassured.  He was explained about the disease process.  He was told  to continue making best efforts with incentive spirometry and mobilization.  Continues to have shortness of breath.  Tyler chest pain.    Assessment/Plan:  Acute Hypoxic Resp. Failure/Pneumonia due to COVID-19  Recent Labs  Lab 03/11/20 0500 03/11/20 0500 03/12/20 0441 03/12/20 0441 03/13/20 0500 03/14/20 0419 03/15/20 0626 03/16/20 0318 03/17/20 0404  DDIMER 3.64*   < > 3.28*  --  2.57* 2.37* 2.22* 1.74*  --   CRP 9.6*  --  6.9*  --  2.8* 1.5*  --   --   --   ALT 47*   < > 49*   < > 37 35 33 33 31   < > = values in this interval not displayed.    Objective findings: Oxygen requirements: HFNC 12 L and nonrebreather.  Saturating in the early to mid 90s.    COVID 19 Therapeutics: Antibacterials: Completed 5-day course of ceftriaxone and azithromycin.  Procalcitonin was 0.30. Remdesivir: Completed 5-day course on 10/3 Steroids: Solu-Medrol Diuretics: Lasix given daily for last 7 days.  Due to borderline low blood pressure we will hold off on another dose of Lasix today.  Reassess tomorrow.   Baricitinib: Initiated on 9/29 for a 14-day course PUD Prophylaxis: Pepcid DVT Prophylaxis:  Lovenox 50 mg daily  From a respiratory standpoint Tyler Jackson remains tenuous though stable.  Continue to wean  down oxygen as tolerated.  He has completed course of Remdesivir.  Remains on steroids.  Remains on baricitinib.  Hold Lasix today due to borderline low blood pressure.  Reassess volume status tomorrow.  Try to keep him in negative fluid balance.  His D-dimer has been improving.  CRP is down to 1.5 few days ago.  Chest x-ray from yesterday was stable compared to previous films.  He has completed course of antibacterials.  Continue to mobilize use incentive spirometry and prone positioning.  CT angiogram was negative for PE.  Lower extremity Doppler study negative for DVT.  Tyler Jackson did not want the inhaled steroids.  He states that it was making him very anxious.  [name] was discontinued.  Tyler Jackson  tested positive for Covid19 on 9/29.  The treatment plan and use of medications and known side effects were discussed with Tyler Jackson. Some of the medications used are based on case reports/anecdotal data.  All other medications being used in the management of COVID-19 based on limited study data.  Complete risks and long-term side effects are unknown, however in the best clinical judgment they seem to be of some benefit.  Tyler Jackson wanted to proceed with treatment options provided.  Thrombocytosis Likely reactive due to COVID-19.  Improved.  Check periodically.  Leukocytosis Secondary to steroids.  Stable.  Transaminitis Secondary to COVID-19.  Resolved.  Normocytic anemia Stable.  Tyler evidence of overt bleeding.  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 Incidentally noted on CT angiogram.  Annual imaging is recommended.    Obesity Estimated body mass index is 28.63 kg/m as calculated from the following:   Height as of this encounter: 5\' 11"  (1.803 m).   Weight as of this encounter: 93.1 kg.   DVT Prophylaxis: Lovenox Code Status: Full code Family Communication: Discussed with Tyler Jackson.  Sister being updated daily.  Alternative number for sister: [phone] Disposition Plan: Hopefully return home when improved  Status is: Inpatient  Remains inpatient appropriate because:IV treatments appropriate due to intensity of illness or inability to take PO and Inpatient level of care appropriate due to severity of illness   Dispo: The Tyler Jackson is from: Home              Anticipated d/c is to: Home              Anticipated d/c date is: > 3 days              Tyler Jackson currently is not medically stable to d/c.      Medications:  Scheduled: . vitamin C  500 mg Oral Daily  . baricitinib  4 mg Oral Daily  . enoxaparin (LOVENOX) injection  40 mg Subcutaneous Q24H  . famotidine  20 mg Oral Daily  . guaiFENesin  600 mg Oral BID  . methylPREDNISolone  (SOLU-MEDROL) injection  80 mg Intravenous Q12H  . zinc sulfate  220 mg Oral Daily   Continuous:  [phone], albuterol, guaiFENesin-dextromethorphan, ondansetron **OR** ondansetron (ZOFRAN) IV, sodium chloride   Objective:  Vital Signs  Vitals:   03/16/20 2351 03/17/20 0400 03/17/20 0728 03/17/20 0748  BP: 99/69 109/74 106/79   Pulse: 93 93 93   Resp: 16 18  20   Temp: 98.2 F (36.8 C) 97.9 F (36.6 C) 97.7 F (36.5 C)   TempSrc: Oral Axillary Oral   SpO2: 100% 100% 96%   Weight:      Height:        Intake/Output Summary (Last 24 hours) at  03/17/2020 0949 Last data filed at 03/17/2020 0900 Gross Jackson 24 hour  Intake 500 ml  Output --  Net 500 ml   Filed Weights   03/08/20 1111 03/09/20 1006 03/14/20 0434  Weight: 101.6 kg 101.6 kg 93.1 kg    General appearance: Awake alert.  In Tyler distress Resp: Normal effort at rest.  Coarse breath sounds bilaterally with crackles at the bases.  Tyler wheezing or rhonchi. Cardio: S1-S2 is normal regular.  Tyler S3-S4.  Tyler rubs murmurs or bruit GI: Abdomen is soft.  Nontender nondistended.  Bowel sounds are present normal.  Tyler masses organomegaly Extremities: Moving all his extremities. Neurologic: Alert and oriented x3.  Tyler focal neurological deficits.      Lab Results:  Data Reviewed: I have personally reviewed following labs and imaging studies  CBC: Recent Labs  Lab 03/11/20 0500 03/11/20 0500 03/12/20 0441 03/13/20 0500 03/14/20 0419 03/15/20 0626 03/16/20 0318  WBC 14.5*   < > 15.5* 12.6* 13.9* 12.0* 13.1*  NEUTROABS 12.2*  --  13.3* 11.2* 12.0*  --   --   HGB 11.2*   < > 12.5* 11.5* 12.3* 12.9* 12.9*  HCT 35.4*   < > 39.2 36.0* 39.8 40.8 39.8  MCV 95.9   < > 96.8 96.5 96.8 97.4 96.6  PLT 554*   < > 631* 623* 575* 519* 557*   < > = values in this interval not displayed.    Basic Metabolic Panel: Recent Labs  Lab 03/11/20 0500 03/11/20 0500 03/12/20 0441 03/12/20 0441 03/13/20 0500 03/14/20 0419  03/15/20 0626 03/16/20 0318 03/17/20 0404  NA 137   < > 137   < > 133* 134* 135 133* 135  K 4.2   < > 4.3   < > 4.4 4.6 4.8 4.6 4.7  CL 103   < > 101   < > 99 99 98 97* 97*  CO2 24   < > 27   < > GLUCOSE 132*   < > 130*   < > 142* 153* 112* 144* 140*  BUN 19   < > 18   < > 22 28* 27* 28* 31*  CREATININE 1.00   < > 1.01   < > 0.93 0.98 1.00 0.96 0.96  CALCIUM 8.3*   < > 8.5*   < > 8.4* 8.7* 8.7* 8.7* 8.7*  MG 2.4  --  2.4  --  2.3 2.3  --   --   --    < > = values in this interval not displayed.    GFR: Estimated Creatinine Clearance: 87 mL/min (by C-G formula based on SCr of 0.96 mg/dL).  Liver Function Tests: Recent Labs  Lab 03/13/20 0500 03/14/20 0419 03/15/20 0626 03/16/20 0318 03/17/20 0404  AST ALT 37 35 33 33 31  ALKPHOS 69 72 68 67 62  BILITOT 0.7 0.8 0.9 0.7 0.9  PROT 6.8 7.3 7.1 7.1 6.8  ALBUMIN 2.1* 2.4* 2.4* 2.3* 2.3*     Recent Results (from the past 240 hour(s))  Blood Culture (routine x 2)     Status: None   Collection Time: 03/08/20 11:15 AM   Specimen: BLOOD  Result Value Ref Range Status   Specimen Description   Final    BLOOD LEFT ANTECUBITAL Performed at [facility], [address] [place], [place] [address]    Special Requests   Final    BOTTLES DRAWN AEROBIC  AND ANAEROBIC Blood Culture adequate volume Performed at [facility] [place], [address] [place], [place] [address]    Culture   Final    Tyler GROWTH 5 DAYS Performed at [facility], [address] [place], [place] [address]    Report Status 03/13/2020 FINAL  Final  Respiratory Panel by RT PCR (Flu A&B, Covid) - Nasopharyngeal Swab     Status: Abnormal   Collection Time: 03/08/20 11:24 AM   Specimen: Nasopharyngeal Swab  Result Value Ref Range Status   SARS Coronavirus 2 by RT PCR POSITIVE (A) NEGATIVE Final    Comment: RESULT CALLED TO, READ BACK BY AND VERIFIED WITH: MARVA SIMMS RN @1311  03/08/2020 OLSONM (NOTE) SARS-CoV-2  target nucleic acids are DETECTED.  SARS-CoV-2 RNA is generally detectable in upper respiratory specimens  during the acute phase of infection. Positive results are indicative of the presence of the identified virus, but do not rule out bacterial infection or co-infection with other pathogens not detected by the test. Clinical correlation with Tyler Jackson history and other diagnostic information is necessary to determine Tyler Jackson infection status. The expected result is Negative.  Fact Sheet for Patients:  https://www.moore.com/https://www.fda.gov/media/142436/download  Fact Sheet for Healthcare Providers: https://www.young.biz/https://www.fda.gov/media/142435/download  This test is not yet approved or cleared by the [place] FDA and  has been authorized for detection and/or diagnosis of SARS-CoV-2 by FDA under an Emergency Use Authorization (EUA).  This EUA will remain in effect (meaning this test can  be used) for the duration of  the COVID-19 declaration under Section 564(b)(1) of the Act, 21 U.S.C. section 360bbb-3(b)(1), unless the authorization is terminated or revoked sooner.      Influenza A by PCR NEGATIVE NEGATIVE Final   Influenza B by PCR NEGATIVE NEGATIVE Final    Comment: (NOTE) The Xpert Xpress SARS-CoV-2/FLU/RSV assay is intended as an aid in  the diagnosis of influenza from Nasopharyngeal swab specimens and  should not be used as a sole basis for treatment. Nasal washings and  aspirates are unacceptable for Xpert Xpress SARS-CoV-2/FLU/RSV  testing.  Fact Sheet for Patients: https://www.moore.com/https://www.fda.gov/media/142436/download  Fact Sheet for Healthcare Providers: https://www.young.biz/https://www.fda.gov/media/142435/download  This test is not yet approved or cleared by the [place] FDA and  has been authorized for detection and/or diagnosis of SARS-CoV-2 by  FDA under an Emergency Use Authorization (EUA). This EUA will remain  in effect (meaning this test can be used) for the duration of the  Covid-19 declaration under  Section 564(b)(1) of the Act, 21  U.S.C. section 360bbb-3(b)(1), unless the authorization is  terminated or revoked. Performed at [facility] [place], [address] [place], [place] [address]   Blood Culture (routine x 2)     Status: None   Collection Time: 03/08/20 11:25 AM   Specimen: BLOOD LEFT HAND  Result Value Ref Range Status   Specimen Description   Final    BLOOD LEFT HAND Performed at [facility] [place], [address] [place], [place] [address]    Special Requests   Final    BOTTLES DRAWN AEROBIC AND ANAEROBIC Blood Culture adequate volume Performed at [facility] [place], [address] [place], [place] [address]    Culture   Final    Tyler GROWTH 5 DAYS Performed at [facility], [address] [place], [place] [address]    Report Status 03/13/2020 FINAL  Final  MRSA PCR Screening     Status: None   Collection Time:  03/11/20 10:21 PM   Specimen: Nasal Mucosa; Nasopharyngeal  Result Value Ref Range Status   MRSA by PCR NEGATIVE NEGATIVE Final    Comment:        The GeneXpert MRSA Assay (FDA approved for NASAL specimens only), is one component of a comprehensive MRSA colonization surveillance program. It is not intended to diagnose MRSA infection nor to guide or monitor treatment for MRSA infections. Performed at [facility], [address], [place], [place] [address]       Radiology Studies: DG Chest Port 1 View  Result Date: 03/16/2020 CLINICAL DATA:  Pneumonia.  COVID-19 positive EXAM: PORTABLE CHEST 1 VIEW COMPARISON:  March 12, 2020 FINDINGS: Patchy airspace opacity in the lung base regions as well as in the periphery of each upper and mid lung region appears essentially stable. There is consolidation in the medial lung bases, stable. Heart is upper normal in size with pulmonary vascularity normal. Tyler adenopathy. Tyler bone lesions. IMPRESSION: Widespread airspace opacity persists, likely due to known COVID-19 pneumonia.  Superimposed bacterial pneumonia in the bases cannot be excluded. Stable cardiac prominence. Tyler adenopathy evident. Electronically Signed   By: Bretta Bang III M.D.   On: 03/16/2020 08:09       LOS: 8 days   Tyler Jackson Tyler Jackson  [facility] Pager on [email or web address]  03/17/2020, 9:49 AM

## 2020-03-18 LAB — COMPREHENSIVE METABOLIC PANEL
ALT: 33 U/L (ref 0–44)
AST: 21 U/L (ref 15–41)
Albumin: 2.2 g/dL — ABNORMAL LOW (ref 3.5–5.0)
Alkaline Phosphatase: 61 U/L (ref 38–126)
Anion gap: 10 (ref 5–15)
BUN: 27 mg/dL — ABNORMAL HIGH (ref 8–23)
CO2: 28 mmol/L (ref 22–32)
Calcium: 8.5 mg/dL — ABNORMAL LOW (ref 8.9–10.3)
Chloride: 97 mmol/L — ABNORMAL LOW (ref 98–111)
Creatinine, Ser: 0.84 mg/dL (ref 0.61–1.24)
GFR, Estimated: >60 mL/min (ref >60)
Glucose, Bld: 136 mg/dL — ABNORMAL HIGH (ref 70–99)
Potassium: 4.6 mmol/L (ref 3.5–5.1)
Sodium: 135 mmol/L (ref 135–145)
Total Bilirubin: 0.6 mg/dL (ref 0.3–1.2)
Total Protein: 6.6 g/dL (ref 6.5–8.1)

## 2020-03-18 MED ORDER — ENOXAPARIN SODIUM 40 MG/0.4ML ~~LOC~~ SOLN
40.0000 mg | Freq: Two times a day (BID) | SUBCUTANEOUS | Status: DC
  Administered 2020-03-18 – 2020-03-23 (×10): 40 mg via SUBCUTANEOUS
  Filled 2020-03-18 (×10): qty 0.4

## 2020-03-18 MED ORDER — ORAL CARE MOUTH RINSE
15.0000 mL | Freq: Two times a day (BID) | OROMUCOSAL | Status: DC
  Administered 2020-03-19 – 2020-04-02 (×23): 15 mL via OROMUCOSAL

## 2020-03-18 MED ORDER — METHYLPREDNISOLONE SODIUM SUCC 125 MG IJ SOLR
60.0000 mg | Freq: Two times a day (BID) | INTRAMUSCULAR | Status: DC
  Administered 2020-03-18 – 2020-03-25 (×15): 60 mg via INTRAVENOUS
  Filled 2020-03-18 (×15): qty 2

## 2020-03-18 NOTE — Progress Notes (Signed)
Pt. Has been irritable, non-compliant and argumentative on each encounter with both RN's and tech's.  He refuses to sit in the chair and is very demanding.  He states, "I know my own body and you don't.  I'm going to stay here, as long as, I want.  It's going to take me a long time to heal".  Educated him on Covid process and how it affects the body and necessity of following the doctors order to heal.  He said he was not going to argue with me and he would discuss with the dr.

## 2020-03-18 NOTE — Progress Notes (Signed)
Refuses to take on any "sel-care"

## 2020-03-18 NOTE — Plan of Care (Signed)

## 2020-03-18 NOTE — Progress Notes (Signed)
PROGRESS NOTE  Tyler Jackson PTW:[id number] DOB: 1951/02/15 DOA: 03/08/2020  PCP: Patient, No Pcp Per  Brief History/Interval Summary: 69 y.o. male with no significant past medical history presented to emergency department with worsening shortness of breath and cough since 1 week. He is not vaccinated against COVID-19.  He was noted to be tachycardic tachypneic and hypoxemic.  Requiring high flow nasal cannula.  COVID-19 test came back positive.  CT angiogram was negative for PE but showed extensive bilateral opacities.  Patient was hospitalized for further management.  Reason for Visit: Acute respiratory failure with hypoxia.  Pneumonia due to COVID-19  Consultants: None  Procedures: None  Antibiotics: Anti-infectives (From admission, onward)   Start     Dose/Rate Route Frequency Ordered Stop   03/09/20 1000  remdesivir 100 mg in sodium chloride 0.9 % 100 mL IVPB        100 mg 200 mL/hr over 30 Minutes Intravenous Daily 03/08/20 1338 03/12/20 0948   03/08/20 1645  cefTRIAXone (ROCEPHIN) 1 g in sodium chloride 0.9 % 100 mL IVPB        1 g 200 mL/hr over 30 Minutes Intravenous Every 24 hours 03/08/20 1640 03/12/20 1630   03/08/20 1645  azithromycin (ZITHROMAX) 500 mg in sodium chloride 0.9 % 250 mL IVPB        500 mg 250 mL/hr over 60 Minutes Intravenous Every 24 hours 03/08/20 1640 03/13/20 1644   03/08/20 1430  remdesivir 100 mg in sodium chloride 0.9 % 100 mL IVPB       "Followed by" Linked Group Details   100 mg 200 mL/hr over 30 Minutes Intravenous  Once 03/08/20 1338 03/08/20 1508   03/08/20 1400  remdesivir 100 mg in sodium chloride 0.9 % 100 mL IVPB       "Followed by" Linked Group Details   100 mg 200 mL/hr over 30 Minutes Intravenous  Once 03/08/20 1338 03/08/20 1434      Subjective/Interval History:  Patient in bed, appears comfortable, denies any headache, no fever, no chest pain or pressure, positive shortness of breath , no abdominal pain. No focal  weakness.   Assessment/Plan:  Acute Hypoxic Resp. Failure/Pneumonia due to COVID-19  - unfortunately he is unvaccinated and has incurred severe lung parenchymal injury from Covid PNA, being treated with IV Steroids, Remdesivir and Baricitinib, continue supportive care, encouraged to sit up in chair in daytime use I-S and flutter valve for pulmonary toiletry.  Prone at night if possible.  Still extremely tenuous.   SpO2: 99 % O2 Flow Rate (L/min): 10 L/min   Recent Labs  Lab 03/12/20 0441 03/12/20 0441 03/13/20 0500 03/13/20 0500 03/14/20 0419 03/15/20 0626 03/16/20 0318 03/17/20 0404 03/18/20 0437  WBC 15.5*  --  12.6*  --  13.9* 12.0* 13.1*  --   --   CRP 6.9*  --  2.8*  --  1.5*  --   --   --   --   DDIMER 3.28*  --  2.57*  --  2.37* 2.22* 1.74*  --   --   AST 29   < > 20   < > 20 21 20 18 21   ALT 49*   < > 37   < > 35 33 33 31 33  ALKPHOS 84   < > 69   < > 72 68 67 62 61  BILITOT 0.7   < > 0.7   < > 0.8 0.9 0.7 0.9 0.6  ALBUMIN 2.2*   < > 2.1*   < >  2.4* 2.4* 2.3* 2.3* 2.2*   < > = values in this interval not displayed.    High D-dimer.  Due to inflammation, increase Lovenox dose on 03/18/2020, check leg ultrasound.   Thrombocytosis Likely reactive due to COVID-19.  Improved.  Check periodically.  Leukocytosis Secondary to steroids.  Stable.  Transaminitis Secondary to COVID-19.  Resolved.  Normocytic anemia Stable.  No evidence of overt bleeding.  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 Incidentally noted on CT angiogram.  Annual imaging is recommended.    Obesity Estimated body mass index is 28.63 kg/m as calculated from the following:   Height as of this encounter: 5\' 11"  (1.803 m).   Weight as of this encounter: 93.1 kg.    DVT Prophylaxis: Lovenox Code Status: Full code Family Communication:   Sister updated  [phone] - 03/18/20 Disposition Plan: Hopefully return home when improved  Status is:  Inpatient  Remains inpatient appropriate because:IV treatments appropriate due to intensity of illness or inability to take PO and Inpatient level of care appropriate due to severity of illness   Dispo: The patient is from: Home              Anticipated d/c is to: Home              Anticipated d/c date is: > 3 days              Patient currently is not medically stable to d/c.      Medications:  Scheduled:  vitamin C  500 mg Oral Daily   baricitinib  4 mg Oral Daily   enoxaparin (LOVENOX) injection  40 mg Subcutaneous Q24H   famotidine  20 mg Oral Daily   feeding supplement (ENSURE ENLIVE)  237 mL Oral BID BM   guaiFENesin  600 mg Oral BID   methylPREDNISolone (SOLU-MEDROL) injection  60 mg Intravenous BID   zinc sulfate  220 mg Oral Daily   Continuous:  05/18/20, albuterol, guaiFENesin-dextromethorphan, [DISCONTINUED] ondansetron **OR** ondansetron (ZOFRAN) IV, sodium chloride   Objective:  Vital Signs  Vitals:   03/17/20 1758 03/17/20 2108 03/18/20 0100 03/18/20 0759  BP: 111/80 105/79 113/83 123/85  Pulse: 93 95 86 96  Resp: (!) 23 (!) 22 (!) 23 (!) 22  Temp: 97.6 F (36.4 C) 98 F (36.7 C) 97.9 F (36.6 C) 98.1 F (36.7 C)  TempSrc: Axillary Axillary Axillary Axillary  SpO2: 99% 100% 100% 99%  Weight:      Height:        Intake/Output Summary (Last 24 hours) at 03/18/2020 1336 Last data filed at 03/18/2020 0900 Gross per 24 hour  Intake 540 ml  Output --  Net 540 ml   Filed Weights   03/08/20 1111 03/09/20 1006 03/14/20 0434  Weight: 101.6 kg 101.6 kg 93.1 kg    Exam  Awake Alert, No new F.N deficits, Normal affect [place].AT,PERRAL Supple Neck,No JVD, No cervical lymphadenopathy appriciated.  Symmetrical Chest wall movement, Good air movement bilaterally, CTAB RRR,No Gallops, Rubs or new Murmurs, No Parasternal Heave +ve B.Sounds, Abd Soft, No tenderness, No organomegaly appriciated, No rebound - guarding or rigidity. No Cyanosis,  Clubbing or edema, No new Rash or bruise   Lab Results:  Data Reviewed: I have personally reviewed following labs and imaging studies  CBC: Recent Labs  Lab 03/12/20 0441 03/13/20 0500 03/14/20 0419 03/15/20 0626 03/16/20 0318  WBC 15.5* 12.6* 13.9* 12.0* 13.1*  NEUTROABS 13.3* 11.2* 12.0*  --   --  HGB 12.5* 11.5* 12.3* 12.9* 12.9*  HCT 39.2 36.0* 39.8 40.8 39.8  MCV 96.8 96.5 96.8 97.4 96.6  PLT 631* 623* 575* 519* 557*    Basic Metabolic Panel: Recent Labs  Lab 03/12/20 0441 03/12/20 0441 03/13/20 0500 03/13/20 0500 03/14/20 0419 03/15/20 0626 03/16/20 0318 03/17/20 0404 03/18/20 0437  NA 137   < > 133*   < > 134* 135 133* 135 135  K 4.3   < > 4.4   < > 4.6 4.8 4.6 4.7 4.6  CL 101   < > 99   < > 99 98 97* 97* 97*  CO2 27   < > 25   < > 24 23 28 28 28   GLUCOSE 130*   < > 142*   < > 153* 112* 144* 140* 136*  BUN 18   < > 22   < > 28* 27* 28* 31* 27*  CREATININE 1.01   < > 0.93   < > 0.98 1.00 0.96 0.96 0.84  CALCIUM 8.5*   < > 8.4*   < > 8.7* 8.7* 8.7* 8.7* 8.5*  MG 2.4  --  2.3  --  2.3  --   --   --   --    < > = values in this interval not displayed.    GFR: Estimated Creatinine Clearance: 99.5 mL/min (by C-G formula based on SCr of 0.84 mg/dL).  Liver Function Tests: Recent Labs  Lab 03/14/20 0419 03/15/20 0626 03/16/20 0318 03/17/20 0404 03/18/20 0437  AST 20 21 20 18 21   ALT 35 33 33 31 33  ALKPHOS 72 68 67 62 61  BILITOT 0.8 0.9 0.7 0.9 0.6  PROT 7.3 7.1 7.1 6.8 6.6  ALBUMIN 2.4* 2.4* 2.3* 2.3* 2.2*     Recent Results (from the past 240 hour(s))  MRSA PCR Screening     Status: None   Collection Time: 03/11/20 10:21 PM   Specimen: Nasal Mucosa; Nasopharyngeal  Result Value Ref Range Status   MRSA by PCR NEGATIVE NEGATIVE Final    Comment:        The GeneXpert MRSA Assay (FDA approved for NASAL specimens only), is one component of a comprehensive MRSA colonization surveillance program. It is not intended to diagnose MRSA infection  nor to guide or monitor treatment for MRSA infections. Performed at [facility], [address], [place], [address] [place]       Radiology Studies: No results found.     LOS: 9 days   [place]  [facility] Pager on [email or web address]  03/18/2020, 1:36 PM

## 2020-03-18 NOTE — Progress Notes (Signed)
°   03/18/20 1932  Assess: MEWS Score  Temp 98.2 F (36.8 C)  BP 104/79  Pulse Rate 98  ECG Heart Rate 100  Resp 16  Level of Consciousness Alert  SpO2 94 %  O2 Device HFNC;Non-rebreather Mask  Patient Activity (if Appropriate) In bed  O2 Flow Rate (L/min) 15 L/min (+15L NRB)  Assess: MEWS Score  MEWS Temp 0  MEWS Systolic 0  MEWS Pulse 0  MEWS RR 0  MEWS LOC 0  MEWS Score 0  MEWS Score Color Green  Assess: if the MEWS score is Yellow or Red  Were vital signs taken at a resting state? Yes  Focused Assessment No change from prior assessment  Early Detection of Sepsis Score *See Row Information* Low  MEWS guidelines implemented *See Row Information* No, other (Comment) (no acute changes)  Treat  Pain Scale 0-10  Pain Score 0  Document  Progress note created (see row info) Yes

## 2020-03-18 NOTE — Progress Notes (Signed)
arguementative

## 2020-03-19 LAB — CBC WITH DIFFERENTIAL/PLATELET
Abs Immature Granulocytes: 0.02 10*3/uL (ref 0.00–0.07)
Basophils Absolute: 0 10*3/uL (ref 0.0–0.1)
Basophils Relative: 0 %
Eosinophils Absolute: 0 10*3/uL (ref 0.0–0.5)
Eosinophils Relative: 0 %
HCT: 40.1 % (ref 39.0–52.0)
Hemoglobin: 12.5 g/dL — ABNORMAL LOW (ref 13.0–17.0)
Immature Granulocytes: 0 %
Lymphocytes Relative: 6 %
Lymphs Abs: 0.7 10*3/uL (ref 0.7–4.0)
MCH: 30.1 pg (ref 26.0–34.0)
MCHC: 31.2 g/dL (ref 30.0–36.0)
MCV: 96.6 fL (ref 80.0–100.0)
Monocytes Absolute: 0.5 10*3/uL (ref 0.1–1.0)
Monocytes Relative: 5 %
Neutro Abs: 9.6 10*3/uL — ABNORMAL HIGH (ref 1.7–7.7)
Neutrophils Relative %: 89 %
Platelets: 525 10*3/uL — ABNORMAL HIGH (ref 150–400)
RBC: 4.15 MIL/uL — ABNORMAL LOW (ref 4.22–5.81)
RDW: 13.3 % (ref 11.5–15.5)
WBC: 10.8 10*3/uL — ABNORMAL HIGH (ref 4.0–10.5)
nRBC: 0 % (ref 0.0–0.2)

## 2020-03-19 LAB — COMPREHENSIVE METABOLIC PANEL
ALT: 46 U/L — ABNORMAL HIGH (ref 0–44)
AST: 31 U/L (ref 15–41)
Albumin: 2.3 g/dL — ABNORMAL LOW (ref 3.5–5.0)
Alkaline Phosphatase: 60 U/L (ref 38–126)
Anion gap: 10 (ref 5–15)
BUN: 26 mg/dL — ABNORMAL HIGH (ref 8–23)
CO2: 26 mmol/L (ref 22–32)
Calcium: 8.6 mg/dL — ABNORMAL LOW (ref 8.9–10.3)
Chloride: 98 mmol/L (ref 98–111)
Creatinine, Ser: 0.76 mg/dL (ref 0.61–1.24)
GFR, Estimated: >60 mL/min (ref >60)
Glucose, Bld: 127 mg/dL — ABNORMAL HIGH (ref 70–99)
Potassium: 5.5 mmol/L — ABNORMAL HIGH (ref 3.5–5.1)
Sodium: 134 mmol/L — ABNORMAL LOW (ref 135–145)
Total Bilirubin: 0.7 mg/dL (ref 0.3–1.2)
Total Protein: 6.7 g/dL (ref 6.5–8.1)

## 2020-03-19 LAB — MAGNESIUM: Magnesium: 2.3 mg/dL (ref 1.7–2.4)

## 2020-03-19 LAB — BRAIN NATRIURETIC PEPTIDE: B Natriuretic Peptide: 33.5 pg/mL (ref 0.0–100.0)

## 2020-03-19 LAB — C-REACTIVE PROTEIN: CRP: 1.7 mg/dL — ABNORMAL HIGH (ref <1.0)

## 2020-03-19 LAB — D-DIMER, QUANTITATIVE: D-Dimer, Quant: 1.02 ug/mL-FEU — ABNORMAL HIGH (ref 0.00–0.50)

## 2020-03-19 MED ORDER — SODIUM POLYSTYRENE SULFONATE 15 GM/60ML PO SUSP
30.0000 g | Freq: Once | ORAL | Status: AC
  Administered 2020-03-19: 30 g via ORAL
  Filled 2020-03-19: qty 120

## 2020-03-19 MED ORDER — FUROSEMIDE 10 MG/ML IJ SOLN
40.0000 mg | Freq: Once | INTRAMUSCULAR | Status: DC
  Filled 2020-03-19: qty 4

## 2020-03-19 MED ORDER — SODIUM ZIRCONIUM CYCLOSILICATE 10 G PO PACK
10.0000 g | PACK | Freq: Two times a day (BID) | ORAL | Status: AC
  Administered 2020-03-19 (×2): 10 g via ORAL
  Filled 2020-03-19 (×2): qty 1

## 2020-03-19 NOTE — Progress Notes (Signed)
PROGRESS NOTE  Tyler Jackson YDX:[id number] DOB: [birth year] DOA: 03/08/2020  PCP: Patient, No Pcp Per  Brief History/Interval Summary: 69 y.o. male with no significant past medical history presented to emergency department with worsening shortness of breath and cough since 1 week. He is not vaccinated against COVID-19.  He was noted to be tachycardic tachypneic and hypoxemic.  Requiring high flow nasal cannula.  COVID-19 test came back positive.  CT angiogram was negative for PE but showed extensive bilateral opacities.  Patient was hospitalized for further management.  Reason for Visit: Acute respiratory failure with hypoxia.  Pneumonia due to COVID-19  Consultants: None  Procedures: None  Antibiotics: Anti-infectives (From admission, onward)   Start     Dose/Rate Route Frequency Ordered Stop   03/09/20 1000  remdesivir 100 mg in sodium chloride 0.9 % 100 mL IVPB        100 mg 200 mL/hr over 30 Minutes Intravenous Daily 03/08/20 1338 03/12/20 0948   03/08/20 1645  cefTRIAXone (ROCEPHIN) 1 g in sodium chloride 0.9 % 100 mL IVPB        1 g 200 mL/hr over 30 Minutes Intravenous Every 24 hours 03/08/20 1640 03/12/20 1630   03/08/20 1645  azithromycin (ZITHROMAX) 500 mg in sodium chloride 0.9 % 250 mL IVPB        500 mg 250 mL/hr over 60 Minutes Intravenous Every 24 hours 03/08/20 1640 03/13/20 1644   03/08/20 1430  remdesivir 100 mg in sodium chloride 0.9 % 100 mL IVPB       "Followed by" Linked Group Details   100 mg 200 mL/hr over 30 Minutes Intravenous  Once 03/08/20 1338 03/08/20 1508   03/08/20 1400  remdesivir 100 mg in sodium chloride 0.9 % 100 mL IVPB       "Followed by" Linked Group Details   100 mg 200 mL/hr over 30 Minutes Intravenous  Once 03/08/20 1338 03/08/20 1434      Subjective/Interval History:  Patient in bed, appears comfortable, denies any headache, no fever, no chest pain or pressure, ++ shortness of breath , no abdominal pain. No focal  weakness.    Assessment/Plan:  Acute Hypoxic Resp. Failure/Pneumonia due to COVID-19  - unfortunately he is unvaccinated and has incurred severe lung parenchymal injury from Covid PNA, being treated with IV Steroids, Remdesivir and Baricitinib, continue supportive care, encouraged to sit up in chair in daytime use I-S and flutter valve for pulmonary toiletry.  Prone at night if possible.  Still extremely tenuous.  Using as needed Lasix as needed 1 dose of IV Lasix on 03/19/2020.   SpO2: 90 % O2 Flow Rate (L/min): (S) 30 L/min (30L HHFNC/15L NRB) FiO2 (%): (S) 100 %   Recent Labs  Lab 03/13/20 0500 03/13/20 0500 03/14/20 0419 03/14/20 0419 03/15/20 0626 03/16/20 0318 03/17/20 0404 03/18/20 0437 03/19/20 0705 03/19/20 0706  WBC 12.6*  --  13.9*  --  12.0* 13.1*  --   --   --  10.8*  CRP 2.8*  --  1.5*  --   --   --   --   --   --  1.7*  DDIMER 2.57*  --  2.37*  --  2.22* 1.74*  --   --   --  1.02*  BNP  --   --   --   --   --   --   --   --  33.5  --   AST 20   < > 20   < >  21 20 18 21   --  31  ALT 37   < > 35   < > 33 33 31 33  --  46*  ALKPHOS 69   < > 72   < > 68 67 62 61  --  60  BILITOT 0.7   < > 0.8   < > 0.9 0.7 0.9 0.6  --  0.7  ALBUMIN 2.1*   < > 2.4*   < > 2.4* 2.3* 2.3* 2.2*  --  2.3*   < > = values in this interval not displayed.     High D-dimer.  Due to inflammation, increase Lovenox dose on 03/18/2020, check leg ultrasound.  Hyperkalemia.  Lasix, Lokelma and Kayexalate.  Monitor.    Thrombocytosis Likely reactive due to COVID-19.  Improved.  Check periodically.  Leukocytosis Secondary to steroids.  Stable.  Transaminitis Secondary to COVID-19.  Resolved.  Normocytic anemia Stable.  No evidence of overt bleeding.  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 Incidentally noted on CT angiogram.  Annual imaging is recommended.    Obesity Estimated body mass index is 28.63 kg/m as calculated from the following:    Height as of this encounter: 5\' 11"  (1.803 m).   Weight as of this encounter: 93.1 kg.    DVT Prophylaxis: Lovenox Code Status: Full code Family Communication:   Sister 05/18/2020 updated  [phone] - 03/18/20 Disposition Plan: Hopefully return home when improved  Status is: Inpatient  Remains inpatient appropriate because:IV treatments appropriate due to intensity of illness or inability to take PO and Inpatient level of care appropriate due to severity of illness   Dispo: The patient is from: Home              Anticipated d/c is to: Home              Anticipated d/c date is: > 3 days              Patient currently is not medically stable to d/c.      Medications:  Scheduled: . vitamin C  500 mg Oral Daily  . baricitinib  4 mg Oral Daily  . enoxaparin (LOVENOX) injection  40 mg Subcutaneous Q12H  . famotidine  20 mg Oral Daily  . feeding supplement (ENSURE ENLIVE)  237 mL Oral BID BM  . guaiFENesin  600 mg Oral BID  . mouth rinse  15 mL Mouth Rinse BID  . methylPREDNISolone (SOLU-MEDROL) injection  60 mg Intravenous BID  . sodium polystyrene  30 g Oral Once  . sodium zirconium cyclosilicate  10 g Oral BID  . zinc sulfate  220 mg Oral Daily   Continuous:  [phone], albuterol, guaiFENesin-dextromethorphan, [DISCONTINUED] ondansetron **OR** ondansetron (ZOFRAN) IV, sodium chloride   Objective:  Vital Signs  Vitals:   03/19/20 0410 03/19/20 0800 03/19/20 0900 03/19/20 1118  BP: 99/74 104/79    Pulse: 87 (!) 103 (!) 105 (!) 116  Resp: 20 (!) 25 (!) 21 (!) 21  Temp: 98.3 F (36.8 C)     TempSrc: Oral     SpO2: 95% (!) 89% 90%   Weight:      Height:       No intake or output data in the 24 hours ending 03/19/20 1144 Filed Weights   03/08/20 1111 03/09/20 1006 03/14/20 0434  Weight: 101.6 kg 101.6 kg 93.1 kg    Exam  Awake Alert, No new F.N deficits, Normal affect [place].AT,PERRAL Supple Neck,No  JVD, No cervical lymphadenopathy appriciated.   Symmetrical Chest wall movement, Good air movement bilaterally, few rales RRR,No Gallops, Rubs or new Murmurs, No Parasternal Heave +ve B.Sounds, Abd Soft, No tenderness, No organomegaly appriciated, No rebound - guarding or rigidity. No Cyanosis, Clubbing or edema, No new Rash or bruise   Lab Results:  Data Reviewed: I have personally reviewed following labs and imaging studies  CBC: Recent Labs  Lab 03/13/20 0500 03/14/20 0419 03/15/20 0626 03/16/20 0318 03/19/20 0706  WBC 12.6* 13.9* 12.0* 13.1* 10.8*  NEUTROABS 11.2* 12.0*  --   --  9.6*  HGB 11.5* 12.3* 12.9* 12.9* 12.5*  HCT 36.0* 39.8 40.8 39.8 40.1  MCV 96.5 96.8 97.4 96.6 96.6  PLT 623* 575* 519* 557* 525*    Basic Metabolic Panel: Recent Labs  Lab 03/13/20 0500 03/13/20 0500 03/14/20 0419 03/14/20 0419 03/15/20 0626 03/16/20 0318 03/17/20 0404 03/18/20 0437 03/19/20 0706  NA 133*   < > 134*   < > 135 133* 135 135 134*  K 4.4   < > 4.6   < > 4.8 4.6 4.7 4.6 5.5*  CL 99   < > 99   < > 98 97* 97* 97* 98  CO2 25   < > 24   < > 23 28 28 28 26   GLUCOSE 142*   < > 153*   < > 112* 144* 140* 136* 127*  BUN 22   < > 28*   < > 27* 28* 31* 27* 26*  CREATININE 0.93   < > 0.98   < > 1.00 0.96 0.96 0.84 0.76  CALCIUM 8.4*   < > 8.7*   < > 8.7* 8.7* 8.7* 8.5* 8.6*  MG 2.3  --  2.3  --   --   --   --   --  2.3   < > = values in this interval not displayed.    GFR: Estimated Creatinine Clearance: 104.4 mL/min (by C-G formula based on SCr of 0.76 mg/dL).  Liver Function Tests: Recent Labs  Lab 03/15/20 0626 03/16/20 0318 03/17/20 0404 03/18/20 0437 03/19/20 0706  AST 21 20 18 21 31   ALT 33 33 31 33 46*  ALKPHOS 68 67 62 61 60  BILITOT 0.9 0.7 0.9 0.6 0.7  PROT 7.1 7.1 6.8 6.6 6.7  ALBUMIN 2.4* 2.3* 2.3* 2.2* 2.3*     Recent Results (from the past 240 hour(s))  MRSA PCR Screening     Status: None   Collection Time: 03/11/20 10:21 PM   Specimen: Nasal Mucosa; Nasopharyngeal  Result Value Ref Range  Status   MRSA by PCR NEGATIVE NEGATIVE Final    Comment:        The GeneXpert MRSA Assay (FDA approved for NASAL specimens only), is one component of a comprehensive MRSA colonization surveillance program. It is not intended to diagnose MRSA infection nor to guide or monitor treatment for MRSA infections. Performed at [facility], [address] [place], [address] [place]       Radiology Studies: No results found.     LOS: 10 days   [place]  [facility] Pager on [email or web address]  03/19/2020, 11:44 AM

## 2020-03-19 NOTE — Progress Notes (Signed)
Pt placed on HHFNC at 30L/100% per MD order. Pt in no distress, no increased WOB. VS within normal limits. RT will continue to monitor

## 2020-03-19 NOTE — Progress Notes (Signed)
Tyler Jackson is yellow, notified MD. Advised Tyler to lay on his side or prone, Tyler states he did that and he can not do it anymore. Attempted to get Tyler out of bed for breakfast, he refused. Tyler on 15L non-rebreather and O2. Will call respiratory.

## 2020-03-19 NOTE — Progress Notes (Signed)
RT called to pt room to assess for possible HHFNC. Pt on 15L HFNC/15L NRB. Spo2 > 84. Pt in no distress, no increased WOB. Will hold HHFNC at this time but left in room for possible need later. Pt encouraged to prone or lay on his side. Per RN pt needed reaffirming that is what is best for him to heal and get home faster. RN at bedside and aware of conversation with pt. Pt agreed to lay on his side at this time. RN aware

## 2020-03-19 NOTE — Evaluation (Signed)
Physical Therapy Evaluation Patient Details Name: Tyler Jackson MRN: [id number] DOB: 02-11-1951 Today's Date: 03/19/2020   History of Present Illness  69 y.o. male with no significant past medical history presented to emergency department with worsening shortness of breath and cough since 1 week.  He was noted to be tachycardic tachypneic and hypoxemic.  Requiring high flow nasal cannula.  COVID-19 test came back positive.  CT angiogram was negative for PE but showed extensive bilateral opacities.  Clinical Impression  Patient presents with decreased mobility due to decreased strength, decreased balance, decreased activity tolerance with hypoxia to 81% while on heated high flow O2 @ 30LPM and on NRB.  Patient eager to improve and participate requesting home exercise handout.  Feel he should progress hopefully to go home if will have family or friends to support him at home as he lives alone.    Follow Up Recommendations Supervision/Assistance - 24 hour;Home health PT    Equipment Recommendations  Rolling walker with 5" wheels    Recommendations for Other Services       Precautions / Restrictions Precautions Precautions: Fall Precaution Comments: wawtch HR, O2      Mobility  Bed Mobility               General bed mobility comments: in recliner  Transfers Overall transfer level: Needs assistance Equipment used: Rolling walker (2 wheeled) Transfers: Sit to/from Stand Sit to Stand: Min assist         General transfer comment: assist for balance/lines  Ambulation/Gait Ambulation/Gait assistance: Min guard Gait Distance (Feet): 1 Feet Assistive device: Rolling walker (2 wheeled)       General Gait Details: marching in place few steps standing at bedside  Stairs            Wheelchair Mobility    Modified Rankin (Stroke Patients Only)       Balance Overall balance assessment: Needs assistance   Sitting balance-Leahy Scale: Good     Standing balance  support: Bilateral upper extremity supported Standing balance-Leahy Scale: Poor                               Pertinent Vitals/Pain Pain Assessment: No/denies pain    Home Living Family/patient expects to be discharged to:: Private residence Living Arrangements: Alone Available Help at Discharge: Friend(s);Family;Available PRN/intermittently Type of Home: House Home Access: Stairs to enter Entrance Stairs-Rails: None Entrance Stairs-Number of Steps: 2 Home Layout: One level Home Equipment: None      Prior Function Level of Independence: Independent         Comments: wasn't working     Hand Dominance   Dominant Hand: Left    Extremity/Trunk Assessment   Upper Extremity Assessment Upper Extremity Assessment: Generalized weakness    Lower Extremity Assessment Lower Extremity Assessment: Generalized weakness       Communication   Communication: No difficulties  Cognition Arousal/Alertness: Awake/alert Behavior During Therapy: WFL for tasks assessed/performed Overall Cognitive Status: Impaired/Different from baseline Area of Impairment: Attention;Following commands;Problem solving                   Current Attention Level: Sustained   Following Commands: Follows one step commands consistently;Follows multi-step commands consistently;Follows multi-step commands with increased time     Problem Solving: Slow processing        General Comments General comments (skin integrity, edema, etc.): on 30% HHFO2 and also wearing NRB; SpO2 with standing activity dropped  to 81% and HR up to 124, recovered in 1 minute SpO2 90% or higher HR 120    Exercises General Exercises - Lower Extremity Long Arc Quad: Strengthening;10 reps;Seated Hip Flexion/Marching: Strengthening;10 reps;Seated Other Exercises Other Exercises: anterior and posterior rocking arms crossed over chest seated in recliner x 10   Assessment/Plan    PT Assessment Patient needs  continued PT services  PT Problem List Decreased strength;Decreased mobility;Decreased activity tolerance;Cardiopulmonary status limiting activity;Decreased balance;Decreased knowledge of use of DME;Decreased cognition       PT Treatment Interventions DME instruction;Therapeutic activities;Gait training;Therapeutic exercise;Patient/family education;Balance training;Functional mobility training    PT Goals (Current goals can be found in the Care Plan section)  Acute Rehab PT Goals Patient Stated Goal: to get stronger, breathe better PT Goal Formulation: With patient Time For Goal Achievement: 04/02/20 Potential to Achieve Goals: Good    Frequency Min 3X/week   Barriers to discharge        Co-evaluation               AM-PAC PT "6 Clicks" Mobility  Outcome Measure Help needed turning from your back to your side while in a flat bed without using bedrails?: A Little Help needed moving from lying on your back to sitting on the side of a flat bed without using bedrails?: A Little Help needed moving to and from a bed to a chair (including a wheelchair)?: A Little Help needed standing up from a chair using your arms (e.g., wheelchair or bedside chair)?: A Little Help needed to walk in hospital room?: Total Help needed climbing 3-5 steps with a railing? : Total 6 Click Score: 14    End of Session Equipment Utilized During Treatment: Oxygen Activity Tolerance: Treatment limited secondary to medical complications (Comment) Patient left: in chair;with call bell/phone within reach   PT Visit Diagnosis: Other abnormalities of gait and mobility (R26.89);Muscle weakness (generalized) (M62.81)    Time: 1240-1306 PT Time Calculation (min) (ACUTE ONLY): 26 min   Charges:   PT Evaluation $PT Eval Moderate Complexity: 1 Mod PT Treatments $Therapeutic Activity: 8-22 mins        Sheran Lawless, PT Acute Rehabilitation  Services [phone] [phone] 03/19/2020   Elray Mcgregor 03/19/2020, 2:04 PM

## 2020-03-19 NOTE — Progress Notes (Signed)
Throughout the night, patient used the non-rebreather mask intermittently, frequently looking at his O2 saturation levels on the monitor, seeming to return to NRB use based on what his O2 sats were.  O2 sats consistently ran 95% and above, occasionally dipping into the low 90's.  Pt appeared to be in no acute distress.  I explained to the patient that it is normal for O2 saturations to fluctuate, even in a healthy individual.

## 2020-03-20 ENCOUNTER — Inpatient Hospital Stay (HOSPITAL_COMMUNITY): Payer: Medicare Other

## 2020-03-20 LAB — BASIC METABOLIC PANEL
Anion gap: 9 (ref 5–15)
BUN: 25 mg/dL — ABNORMAL HIGH (ref 8–23)
CO2: 30 mmol/L (ref 22–32)
Calcium: 8.5 mg/dL — ABNORMAL LOW (ref 8.9–10.3)
Chloride: 97 mmol/L — ABNORMAL LOW (ref 98–111)
Creatinine, Ser: 0.86 mg/dL (ref 0.61–1.24)
GFR, Estimated: >60 mL/min (ref >60)
Glucose, Bld: 147 mg/dL — ABNORMAL HIGH (ref 70–99)
Potassium: 3.9 mmol/L (ref 3.5–5.1)
Sodium: 136 mmol/L (ref 135–145)

## 2020-03-20 LAB — CBC WITH DIFFERENTIAL/PLATELET
Abs Immature Granulocytes: 0.06 10*3/uL (ref 0.00–0.07)
Basophils Absolute: 0 10*3/uL (ref 0.0–0.1)
Basophils Relative: 0 %
Eosinophils Absolute: 0 10*3/uL (ref 0.0–0.5)
Eosinophils Relative: 0 %
HCT: 37.4 % — ABNORMAL LOW (ref 39.0–52.0)
Hemoglobin: 11.8 g/dL — ABNORMAL LOW (ref 13.0–17.0)
Immature Granulocytes: 1 %
Lymphocytes Relative: 4 %
Lymphs Abs: 0.5 10*3/uL — ABNORMAL LOW (ref 0.7–4.0)
MCH: 30 pg (ref 26.0–34.0)
MCHC: 31.6 g/dL (ref 30.0–36.0)
MCV: 95.2 fL (ref 80.0–100.0)
Monocytes Absolute: 0.6 10*3/uL (ref 0.1–1.0)
Monocytes Relative: 5 %
Neutro Abs: 12.1 10*3/uL — ABNORMAL HIGH (ref 1.7–7.7)
Neutrophils Relative %: 90 %
Platelets: 478 10*3/uL — ABNORMAL HIGH (ref 150–400)
RBC: 3.93 MIL/uL — ABNORMAL LOW (ref 4.22–5.81)
RDW: 13.2 % (ref 11.5–15.5)
WBC: 13.3 10*3/uL — ABNORMAL HIGH (ref 4.0–10.5)
nRBC: 0 % (ref 0.0–0.2)

## 2020-03-20 LAB — PROCALCITONIN: Procalcitonin: <0.1 ng/mL

## 2020-03-20 LAB — C-REACTIVE PROTEIN: CRP: 2.2 mg/dL — ABNORMAL HIGH (ref <1.0)

## 2020-03-20 LAB — MAGNESIUM: Magnesium: 2.2 mg/dL (ref 1.7–2.4)

## 2020-03-20 LAB — BRAIN NATRIURETIC PEPTIDE: B Natriuretic Peptide: 45 pg/mL (ref 0.0–100.0)

## 2020-03-20 LAB — D-DIMER, QUANTITATIVE: D-Dimer, Quant: 1.08 ug/mL-FEU — ABNORMAL HIGH (ref 0.00–0.50)

## 2020-03-20 NOTE — Progress Notes (Signed)
PROGRESS NOTE  Tyler Jackson GYF:[id number] DOB: Jan 15, 1951 DOA: 03/08/2020  PCP: Patient, No Pcp Per  Brief History/Interval Summary: 69 y.o. male with no significant past medical history presented to emergency department with worsening shortness of breath and cough since 1 week. He is not vaccinated against COVID-19.  He was noted to be tachycardic tachypneic and hypoxemic.  Requiring high flow nasal cannula.  COVID-19 test came back positive.  CT angiogram was negative for PE but showed extensive bilateral opacities.  Patient was hospitalized for further management.  Reason for Visit: Acute respiratory failure with hypoxia.  Pneumonia due to COVID-19  Consultants: None  Procedures: None  Antibiotics: Anti-infectives (From admission, onward)   Start     Dose/Rate Route Frequency Ordered Stop   03/09/20 1000  remdesivir 100 mg in sodium chloride 0.9 % 100 mL IVPB        100 mg 200 mL/hr over 30 Minutes Intravenous Daily 03/08/20 1338 03/12/20 0948   03/08/20 1645  cefTRIAXone (ROCEPHIN) 1 g in sodium chloride 0.9 % 100 mL IVPB        1 g 200 mL/hr over 30 Minutes Intravenous Every 24 hours 03/08/20 1640 03/12/20 1630   03/08/20 1645  azithromycin (ZITHROMAX) 500 mg in sodium chloride 0.9 % 250 mL IVPB        500 mg 250 mL/hr over 60 Minutes Intravenous Every 24 hours 03/08/20 1640 03/13/20 1644   03/08/20 1430  remdesivir 100 mg in sodium chloride 0.9 % 100 mL IVPB       "Followed by" Linked Group Details   100 mg 200 mL/hr over 30 Minutes Intravenous  Once 03/08/20 1338 03/08/20 1508   03/08/20 1400  remdesivir 100 mg in sodium chloride 0.9 % 100 mL IVPB       "Followed by" Linked Group Details   100 mg 200 mL/hr over 30 Minutes Intravenous  Once 03/08/20 1338 03/08/20 1434      Subjective/Interval History:  Patient in bed, appears comfortable, denies any headache, no fever, no chest pain or pressure, positive shortness of breath , no abdominal pain. No focal  weakness.    Assessment/Plan:  Acute Hypoxic Resp. Failure/Pneumonia due to COVID-19  - unfortunately he is unvaccinated and has incurred severe lung parenchymal injury from Covid PNA, being treated with IV Steroids, Remdesivir and Baricitinib, continue supportive care, encouraged to sit up in chair in daytime use I-S and flutter valve for pulmonary toiletry, patient noncompliant with recommendations has been counseled multiple times.  Prone at night if possible.  Still extremely tenuous.  Using as needed Lasix as needed.  Another IV dose on 03/20/2020.Marland Kitchen   SpO2: 98 % O2 Flow Rate (L/min): 25 L/min FiO2 (%): 100 %   Recent Labs  Lab 03/14/20 0419 03/14/20 0419 03/15/20 0626 03/16/20 0318 03/17/20 0404 03/18/20 0437 03/19/20 0705 03/19/20 0706 03/20/20 0549 03/20/20 0550 03/20/20 0926  WBC 13.9*  --  12.0* 13.1*  --   --   --  10.8*  --  13.3*  --   CRP 1.5*  --   --   --   --   --   --  1.7*  --  2.2*  --   DDIMER 2.37*  --  2.22* 1.74*  --   --   --  1.02*  --  1.08*  --   BNP  --   --   --   --   --   --  33.5  --  45.0  --   --  PROCALCITON  --   --   --   --   --   --   --   --   --   --  <0.10  AST 20   < > 21 20 18 21   --  31  --   --   --   ALT 35   < > 33 33 31 33  --  46*  --   --   --   ALKPHOS 72   < > 68 67 62 61  --  60  --   --   --   BILITOT 0.8   < > 0.9 0.7 0.9 0.6  --  0.7  --   --   --   ALBUMIN 2.4*   < > 2.4* 2.3* 2.3* 2.2*  --  2.3*  --   --   --    < > = values in this interval not displayed.     High D-dimer.  Due to inflammation, increase Lovenox dose on 03/18/2020, lower extremity venous duplex unremarkable.  Hyperkalemia.  Lasix, Lokelma and Kayexalate.  Monitor.    Thrombocytosis Likely reactive due to COVID-19.  Improved.  Check periodically.  Leukocytosis Secondary to steroids.  Stable.  Transaminitis Secondary to COVID-19.  Resolved.  Normocytic anemia Stable.  No evidence of overt bleeding.  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 Incidentally noted on CT angiogram.  Annual imaging is recommended.      DVT Prophylaxis: Lovenox Code Status: Full code Family Communication:   Sister Eber JonesCarolyn updated  [phone] - 03/18/20 Disposition Plan: Hopefully return home when improved  Status is: Inpatient  Remains inpatient appropriate because:IV treatments appropriate due to intensity of illness or inability to take PO and Inpatient level of care appropriate due to severity of illness   Dispo: The patient is from: Home              Anticipated d/c is to: Home              Anticipated d/c date is: > 3 days              Patient currently is not medically stable to d/c.      Medications:  Scheduled: . vitamin C  500 mg Oral Daily  . baricitinib  4 mg Oral Daily  . enoxaparin (LOVENOX) injection  40 mg Subcutaneous Q12H  . famotidine  20 mg Oral Daily  . feeding supplement (ENSURE ENLIVE)  237 mL Oral BID BM  . furosemide  40 mg Intravenous Once  . guaiFENesin  600 mg Oral BID  . mouth rinse  15 mL Mouth Rinse BID  . methylPREDNISolone (SOLU-MEDROL) injection  60 mg Intravenous BID  . zinc sulfate  220 mg Oral Daily   Continuous:  WGN:[id number]:acetaminophen, albuterol, guaiFENesin-dextromethorphan, [DISCONTINUED] ondansetron **OR** ondansetron (ZOFRAN) IV, sodium chloride   Objective:  Vital Signs  Vitals:   03/20/20 0117 03/20/20 0425 03/20/20 0551 03/20/20 0900  BP:  (!) 81/57 98/70 102/84  Pulse: 87 86    Resp: 18 19    Temp:  98.2 F (36.8 C)    TempSrc:  Oral    SpO2: 100% 98%    Weight:      Height:        Intake/Output Summary (Last 24 hours) at 03/20/2020 1126 Last data filed at 03/20/2020 0912 Gross per 24 hour  Intake 25 ml  Output 600 ml  Net -575 ml  Filed Weights   03/08/20 1111 03/09/20 1006 03/14/20 0434  Weight: 101.6 kg 101.6 kg 93.1 kg    Exam  Awake Alert, No new F.N deficits, Normal affect [place].AT,PERRAL Supple Neck,No JVD,  No cervical lymphadenopathy appriciated.  Symmetrical Chest wall movement, Good air movement bilaterally, few rales RRR,No Gallops, Rubs or new Murmurs, No Parasternal Heave +ve B.Sounds, Abd Soft, No tenderness, No organomegaly appriciated, No rebound - guarding or rigidity. No Cyanosis, Clubbing or edema, No new Rash or bruise  Lab Results:  Data Reviewed: I have personally reviewed following labs and imaging studies  CBC: Recent Labs  Lab 03/14/20 0419 03/15/20 0626 03/16/20 0318 03/19/20 0706 03/20/20 0550  WBC 13.9* 12.0* 13.1* 10.8* 13.3*  NEUTROABS 12.0*  --   --  9.6* 12.1*  HGB 12.3* 12.9* 12.9* 12.5* 11.8*  HCT 39.8 40.8 39.8 40.1 37.4*  MCV 96.8 97.4 96.6 96.6 95.2  PLT 575* 519* 557* 525* 478*    Basic Metabolic Panel: Recent Labs  Lab 03/14/20 0419 03/15/20 0626 03/16/20 0318 03/17/20 0404 03/18/20 0437 03/19/20 0706 03/20/20 0550 03/20/20 0926  NA 134*   < > 133* 135 135 134*  --  136  K 4.6   < > 4.6 4.7 4.6 5.5*  --  3.9  CL 99   < > 97* 97* 97* 98  --  97*  CO2 24   < > 28 28 28 26   --  30  GLUCOSE 153*   < > 144* 140* 136* 127*  --  147*  BUN 28*   < > 28* 31* 27* 26*  --  25*  CREATININE 0.98   < > 0.96 0.96 0.84 0.76  --  0.86  CALCIUM 8.7*   < > 8.7* 8.7* 8.5* 8.6*  --  8.5*  MG 2.3  --   --   --   --  2.3 2.2  --    < > = values in this interval not displayed.    GFR: Estimated Creatinine Clearance: 97.1 mL/min (by C-G formula based on SCr of 0.86 mg/dL).  Liver Function Tests: Recent Labs  Lab 03/15/20 0626 03/16/20 0318 03/17/20 0404 03/18/20 0437 03/19/20 0706  AST 21 20 18 21 31   ALT 33 33 31 33 46*  ALKPHOS 68 67 62 61 60  BILITOT 0.9 0.7 0.9 0.6 0.7  PROT 7.1 7.1 6.8 6.6 6.7  ALBUMIN 2.4* 2.3* 2.3* 2.2* 2.3*     Recent Results (from the past 240 hour(s))  MRSA PCR Screening     Status: None   Collection Time: 03/11/20 10:21 PM   Specimen: Nasal Mucosa; Nasopharyngeal  Result Value Ref Range Status   MRSA by PCR  NEGATIVE NEGATIVE Final    Comment:        The GeneXpert MRSA Assay (FDA approved for NASAL specimens only), is one component of a comprehensive MRSA colonization surveillance program. It is not intended to diagnose MRSA infection nor to guide or monitor treatment for MRSA infections. Performed at [facility], [address], [place], [address] [place]       Radiology Studies: DG Chest Port 1 View  Result Date: 03/20/2020 CLINICAL DATA:  Shortness of breath.  COVID positive. EXAM: PORTABLE CHEST 1 VIEW COMPARISON:  03/16/2020 FINDINGS: 0858 hours. Low volume film. Peripherally predominant patchy airspace disease again noted, minimally improved in the interval. Interval improvement in retrocardiac left base collapse/consolidation. No pleural effusion or evidence of pneumothorax. Cardiopericardial silhouette is at upper limits of  normal for size. The visualized bony structures of the thorax show no acute abnormality. Telemetry leads overlie the chest. IMPRESSION: Low volume film with slight interval improvement in bilateral patchy airspace disease. Interval improvement in left basilar aeration. Electronically Signed   By: Kennith Center M.D.   On: 03/20/2020 09:22       LOS: 11 days   Ladajah Soltys  [facility] Pager on [email or web address]  03/20/2020, 11:26 AM

## 2020-03-20 NOTE — Evaluation (Signed)
Occupational Therapy Evaluation Patient Details Name: Tyler Jackson MRN: [id number] DOB: December 31, 1950 Today's Date: 03/20/2020    History of Present Illness 69 y.o. male with no significant past medical history presented to emergency department with worsening shortness of breath and cough since 1 week.  He was noted to be tachycardic tachypneic and hypoxemic.  Requiring high flow nasal cannula.  COVID-19 test came back positive.  CT angiogram was negative for PE but showed extensive bilateral opacities.   Clinical Impression   Pt reports that prior to COVID he was independent in ADL and mobility. Today Pt presents fatigued, decreased cognition, decreased activity tolerance, and complaining of light headedness. Pt with decrease in SpO2 from 94% to 82% on 30L HHFNC and NRB. Min guard assist to [facility], complaining of light headedness and so almost immediate return to bed with assist for BLE back into bed. Pt SpO2 returned to >90 within 2 min Pt DOE 2/4. Unable to get BP during this time due to equipment failure and misread. Attempted 3 times before getting 102/84 supine with HOB elevated. Pt also given HEP with orange theraband with worksheets. Pt able to return demo with verbal cues, will need reinforcement. Pt will require continued skilled OT in the acute setting and at this time he will require SNF level care to maximize independence and safety since he lives alone, although OT will monitor closely for ability to progress. Next session to focus on OOB    Follow Up Recommendations  SNF;Supervision/Assistance - 24 hour    Equipment Recommendations  3 in 1 bedside commode    Recommendations for Other Services       Precautions / Restrictions Precautions Precautions: Fall Precaution Comments: watch HR, O2 Restrictions Weight Bearing Restrictions: No      Mobility Bed Mobility Overal bed mobility: Needs Assistance Bed Mobility: Supine to Sit;Sit to Supine     Supine to sit: Min guard;HOB  elevated Sit to supine: Min assist;HOB elevated (for BLE)   General bed mobility comments: use of rails, increased time needed  Transfers Overall transfer level: Needs assistance Equipment used: 1 person hand held assist Transfers: Stand Pivot Transfers   Stand pivot transfers: Min guard       General transfer comment: therapist managing lines and gown    Balance Overall balance assessment: Needs assistance Sitting-balance support: Single extremity supported;Feet supported Sitting balance-Leahy Scale: Fair Sitting balance - Comments: Im very lightheaded   Standing balance support: Bilateral upper extremity supported Standing balance-Leahy Scale: Poor                             ADL either performed or assessed with clinical judgement   ADL Overall ADL's : Needs assistance/impaired Eating/Feeding: Set up;Bed level (HOb elevated) Eating/Feeding Details (indicate cue type and reason): taking bites of apple sauce managing utensils, containers, and NRB Grooming: Wash/dry hands;Wash/dry face;Set up;Sitting Grooming Details (indicate cue type and reason): HOB elevated - declining sitting EOB due to light headedness Upper Body Bathing: Moderate assistance   Lower Body Bathing: Moderate assistance Lower Body Bathing Details (indicate cue type and reason): assist from knees down to feet Upper Body Dressing : Moderate assistance;Sitting   Lower Body Dressing: Moderate assistance;Sitting/lateral leans   Toilet Transfer: Min guard;Stand-pivot;BSC   Toileting- Clothing Manipulation and Hygiene: Min guard         General ADL Comments: decreased activity tolerance, decreased cognition, lightheaded     Vision  Perception     Praxis      Pertinent Vitals/Pain Pain Assessment: No/denies pain (lightheaded)     Hand Dominance Left   Extremity/Trunk Assessment Upper Extremity Assessment Upper Extremity Assessment: Generalized weakness   Lower  Extremity Assessment Lower Extremity Assessment: Generalized weakness       Communication Communication Communication: No difficulties (NRB used throughout session)   Cognition Arousal/Alertness: Awake/alert Behavior During Therapy: WFL for tasks assessed/performed Overall Cognitive Status: Impaired/Different from baseline Area of Impairment: Attention;Following commands;Problem solving                   Current Attention Level: Sustained   Following Commands: Follows one step commands consistently;Follows multi-step commands consistently;Follows multi-step commands with increased time     Problem Solving: Slow processing;Requires verbal cues General Comments: Pt able to return HEP with demonstration with verbal cues for problem solving   General Comments  on 30L via HFNC in addition to NRB mask. HR initially 102, SpO2 dropped to 83% after stand pivot, Pt complaining of light headedness throughout session. Took a very long time to get cuff working so by the time we got it, we'd transferred back to bed and supine HOB elevated BP was 102/84    Exercises Exercises: Other exercises Other Exercises Other Exercises: provided theraband and HEP for Pt along with modeling and return demonstration   Shoulder Instructions      Home Living Family/patient expects to be discharged to:: Private residence Living Arrangements: Alone Available Help at Discharge: Friend(s);Family;Available PRN/intermittently Type of Home: House Home Access: Stairs to enter [employer] of Steps: 2 Entrance Stairs-Rails: None Home Layout: One level     Bathroom Shower/Tub: Chief Strategy Officer: Standard     Home Equipment: None          Prior Functioning/Environment Level of Independence: Independent                 OT Problem List: Decreased strength;Decreased activity tolerance;Impaired balance (sitting and/or standing);Decreased cognition;Decreased safety  awareness;Decreased knowledge of use of DME or AE;Cardiopulmonary status limiting activity      OT Treatment/Interventions: Self-care/ADL training;Energy conservation;DME and/or AE instruction;Therapeutic activities;Patient/family education;Balance training    OT Goals(Current goals can be found in the care plan section) Acute Rehab OT Goals Patient Stated Goal: to get stronger, breathe better OT Goal Formulation: With patient Time For Goal Achievement: 04/03/20 Potential to Achieve Goals: Good ADL Goals Pt Will Perform Grooming: with supervision;standing Pt Will Perform Upper Body Dressing: with modified independence;sitting Pt Will Perform Lower Body Dressing: with supervision;sit to/from stand Pt Will Transfer to Toilet: with supervision;ambulating Pt Will Perform Toileting - Clothing Manipulation and hygiene: with supervision;sit to/from stand Pt/caregiver will Perform Home Exercise Program: Right Upper extremity;Left upper extremity;With theraband;With written HEP provided;Independently Additional ADL Goal #1: Pt will recall and implement at least 3 ways to conserve energy during ADL routine with no cues  OT Frequency: Min 2X/week   Barriers to D/C: Decreased caregiver support          Co-evaluation              AM-PAC OT "6 Clicks" Daily Activity     Outcome Measure Help from another person eating meals?: A Little Help from another person taking care of personal grooming?: A Little Help from another person toileting, which includes using toliet, bedpan, or urinal?: A Little Help from another person bathing (including washing, rinsing, drying)?: A Lot Help from another person to put on and taking  off regular upper body clothing?: A Little Help from another person to put on and taking off regular lower body clothing?: A Lot 6 Click Score: 16   End of Session Equipment Utilized During Treatment: Oxygen (30L via HHFNC with NRB) Nurse Communication: Mobility  status;Precautions  Activity Tolerance: Other (comment) (Pt limited by lightheadedness) Patient left: in bed;with call bell/phone within reach  OT Visit Diagnosis: Other abnormalities of gait and mobility (R26.89);Muscle weakness (generalized) (M62.81);Other symptoms and signs involving cognitive function                Time: 2094-7096 OT Time Calculation (min): 29 min Charges:  OT General Charges $OT Visit: 1 Visit OT Evaluation $OT Eval Moderate Complexity: 1 Mod OT Treatments $Self Care/Home Management : 8-22 mins  Nyoka Cowden OTR/L Acute Rehabilitation Services Pager: [phone] Office: [phone]  Evern Bio Leshawn Houseworth 03/20/2020, 12:15 PM

## 2020-03-20 NOTE — TOC Progression Note (Signed)
Transition of Care [facility]) - Progression Note    Patient Details  Name: Cristian Davitt MRN: [id number] Date of Birth: 02/17/1951  [facility] CM/SW Contact  Nance Pear, RN Phone Number: 03/20/2020, 1:26 PM  Clinical Narrative:    Case manager providing information from chart due to patient being on 25L Nonrebreather mask.  Spoke to patient on phone 5 days ago.  Patient is uninsured and does not have PCP.  Original date of COVID clinic has been changed due to patient projected discharge date being extended due to oxygen demands, New COVID clinic date is 04/05/20 at 3:00 pm.  Patient lives alone, family to pick up post discharge, may have potential need for home health vs SNF, DME needs and oxygen.  TOC following for progression of care.     Expected Discharge Plan: Home w Home Health Services Barriers to Discharge: Continued Medical Work up  Expected Discharge Plan and Services Expected Discharge Plan: Home w Home Health Services   Discharge Planning Services: CM Consult   Living arrangements for the past 2 months: Single Family Home                           HH Arranged: NA           Social Determinants of Health (SDOH) Interventions    Readmission Risk Interventions No flowsheet data found.

## 2020-03-20 NOTE — Progress Notes (Signed)
Instructed pt to try to sleep on his abdomen tonight to improve his breathing. Pt stated understanding.  Pt states he has been sleeping on his abdomen and side at night during his hospital stay. Will continue to monitor pt. Tacey Ruiz, RN

## 2020-03-21 LAB — CBC WITH DIFFERENTIAL/PLATELET
Abs Immature Granulocytes: 0.04 10*3/uL (ref 0.00–0.07)
Basophils Absolute: 0 10*3/uL (ref 0.0–0.1)
Basophils Relative: 0 %
Eosinophils Absolute: 0 10*3/uL (ref 0.0–0.5)
Eosinophils Relative: 0 %
HCT: 35.2 % — ABNORMAL LOW (ref 39.0–52.0)
Hemoglobin: 11.1 g/dL — ABNORMAL LOW (ref 13.0–17.0)
Immature Granulocytes: 0 %
Lymphocytes Relative: 4 %
Lymphs Abs: 0.5 10*3/uL — ABNORMAL LOW (ref 0.7–4.0)
MCH: 29.8 pg (ref 26.0–34.0)
MCHC: 31.5 g/dL (ref 30.0–36.0)
MCV: 94.6 fL (ref 80.0–100.0)
Monocytes Absolute: 0.6 10*3/uL (ref 0.1–1.0)
Monocytes Relative: 5 %
Neutro Abs: 11.8 10*3/uL — ABNORMAL HIGH (ref 1.7–7.7)
Neutrophils Relative %: 91 %
Platelets: 402 10*3/uL — ABNORMAL HIGH (ref 150–400)
RBC: 3.72 MIL/uL — ABNORMAL LOW (ref 4.22–5.81)
RDW: 13.1 % (ref 11.5–15.5)
WBC: 12.9 10*3/uL — ABNORMAL HIGH (ref 4.0–10.5)
nRBC: 0 % (ref 0.0–0.2)

## 2020-03-21 LAB — COMPREHENSIVE METABOLIC PANEL
ALT: 52 U/L — ABNORMAL HIGH (ref 0–44)
AST: 26 U/L (ref 15–41)
Albumin: 2.3 g/dL — ABNORMAL LOW (ref 3.5–5.0)
Alkaline Phosphatase: 67 U/L (ref 38–126)
Anion gap: 10 (ref 5–15)
BUN: 24 mg/dL — ABNORMAL HIGH (ref 8–23)
CO2: 27 mmol/L (ref 22–32)
Calcium: 8.4 mg/dL — ABNORMAL LOW (ref 8.9–10.3)
Chloride: 96 mmol/L — ABNORMAL LOW (ref 98–111)
Creatinine, Ser: 0.67 mg/dL (ref 0.61–1.24)
GFR, Estimated: >60 mL/min (ref >60)
Glucose, Bld: 152 mg/dL — ABNORMAL HIGH (ref 70–99)
Potassium: 4.5 mmol/L (ref 3.5–5.1)
Sodium: 133 mmol/L — ABNORMAL LOW (ref 135–145)
Total Bilirubin: 1.1 mg/dL (ref 0.3–1.2)
Total Protein: 6 g/dL — ABNORMAL LOW (ref 6.5–8.1)

## 2020-03-21 LAB — MAGNESIUM: Magnesium: 2.2 mg/dL (ref 1.7–2.4)

## 2020-03-21 LAB — C-REACTIVE PROTEIN: CRP: 2.1 mg/dL — ABNORMAL HIGH (ref <1.0)

## 2020-03-21 LAB — BRAIN NATRIURETIC PEPTIDE: B Natriuretic Peptide: 29.9 pg/mL (ref 0.0–100.0)

## 2020-03-21 LAB — D-DIMER, QUANTITATIVE: D-Dimer, Quant: 0.91 ug/mL-FEU — ABNORMAL HIGH (ref 0.00–0.50)

## 2020-03-21 LAB — PROCALCITONIN: Procalcitonin: <0.1 ng/mL

## 2020-03-21 LAB — GLUCOSE, CAPILLARY: Glucose-Capillary: 222 mg/dL — ABNORMAL HIGH (ref 70–99)

## 2020-03-21 MED ORDER — FUROSEMIDE 40 MG PO TABS
40.0000 mg | ORAL_TABLET | Freq: Once | ORAL | Status: AC
  Administered 2020-03-21: 40 mg via ORAL
  Filled 2020-03-21: qty 1

## 2020-03-21 NOTE — Progress Notes (Signed)
PROGRESS NOTE  Tyler Jackson LPF:[id number] DOB: May 16, 1951 DOA: 03/08/2020  PCP: Patient, No Pcp Per  Brief History/Interval Summary: 69 y.o. male with no significant past medical history presented to emergency department with worsening shortness of breath and cough since 1 week. He is not vaccinated against COVID-19.  He was noted to be tachycardic tachypneic and hypoxemic.  Requiring high flow nasal cannula.  COVID-19 test came back positive.  CT angiogram was negative for PE but showed extensive bilateral opacities.  Patient was hospitalized for further management.  Reason for Visit: Acute respiratory failure with hypoxia.  Pneumonia due to COVID-19  Consultants: None  Procedures: None  Antibiotics: Anti-infectives (From admission, onward)   Start     Dose/Rate Route Frequency Ordered Stop   03/09/20 1000  remdesivir 100 mg in sodium chloride 0.9 % 100 mL IVPB        100 mg 200 mL/hr over 30 Minutes Intravenous Daily 03/08/20 1338 03/12/20 0948   03/08/20 1645  cefTRIAXone (ROCEPHIN) 1 g in sodium chloride 0.9 % 100 mL IVPB        1 g 200 mL/hr over 30 Minutes Intravenous Every 24 hours 03/08/20 1640 03/12/20 1630   03/08/20 1645  azithromycin (ZITHROMAX) 500 mg in sodium chloride 0.9 % 250 mL IVPB        500 mg 250 mL/hr over 60 Minutes Intravenous Every 24 hours 03/08/20 1640 03/13/20 1644   03/08/20 1430  remdesivir 100 mg in sodium chloride 0.9 % 100 mL IVPB       "Followed by" Linked Group Details   100 mg 200 mL/hr over 30 Minutes Intravenous  Once 03/08/20 1338 03/08/20 1508   03/08/20 1400  remdesivir 100 mg in sodium chloride 0.9 % 100 mL IVPB       "Followed by" Linked Group Details   100 mg 200 mL/hr over 30 Minutes Intravenous  Once 03/08/20 1338 03/08/20 1434      Subjective/Interval History:  Patient in bed, appears comfortable, denies any headache, no fever, no chest pain or pressure, ++ shortness of breath , no abdominal pain. No focal  weakness.     Assessment/Plan:  Acute Hypoxic Resp. Failure/Pneumonia due to COVID-19  - unfortunately he is unvaccinated and has incurred severe lung parenchymal injury from Covid PNA, being treated with IV Steroids, Remdesivir and Baricitinib, continue supportive care, encouraged to sit up in chair in daytime use I-S and flutter valve for pulmonary toiletry, patient noncompliant with recommendations has been counseled multiple times.  Prone at night if possible.  Still extremely tenuous.  Using as needed Lasix as needed, dose on 03/21/2020.   SpO2: 96 % O2 Flow Rate (L/min): 35 L/min FiO2 (%): 90 %   Recent Labs  Lab 03/15/20 0626 03/15/20 0626 03/16/20 0318 03/17/20 0404 03/18/20 0437 03/19/20 0705 03/19/20 0706 03/20/20 0549 03/20/20 0550 03/20/20 0926 03/21/20 0500 03/21/20 0613  WBC 12.0*  --  13.1*  --   --   --  10.8*  --  13.3*  --   --  12.9*  CRP  --   --   --   --   --   --  1.7*  --  2.2*  --   --  2.1*  DDIMER 2.22*  --  1.74*  --   --   --  1.02*  --  1.08*  --   --  0.91*  BNP  --   --   --   --   --  33.5  --  45.0  --   --  29.9  --   PROCALCITON  --   --   --   --   --   --   --   --   --  <0.10  --  <0.10  AST 21   < > 20 18 21   --  31  --   --   --   --  26  ALT 33   < > 33 31 33  --  46*  --   --   --   --  52*  ALKPHOS 68   < > 67 62 61  --  60  --   --   --   --  67  BILITOT 0.9   < > 0.7 0.9 0.6  --  0.7  --   --   --   --  1.1  ALBUMIN 2.4*   < > 2.3* 2.3* 2.2*  --  2.3*  --   --   --   --  2.3*   < > = values in this interval not displayed.     High D-dimer.  Due to inflammation, increased Lovenox dose on 03/18/2020, lower extremity venous duplex unremarkable.  Hyperkalemia. Resolved after  Lasix, Lokelma and Kayexalate.  Monitor.    Thrombocytosis - Likely reactive due to COVID-19.  Improved.  Check periodically.  Leukocytosis - Secondary to steroids.  Stable.  Transaminitis - Secondary to COVID-19.  Resolved.  Normocytic anemia - Stable.   No evidence of overt bleeding.  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 Incidentally noted on CT angiogram.  Annual imaging is recommended, follow with vascular surgery post discharge.      DVT Prophylaxis: Lovenox Code Status: Full code Family Communication:   Sister Eber JonesCarolyn updated  [phone] - 03/18/20 Disposition Plan: Hopefully return home when improved  Status is: Inpatient  Remains inpatient appropriate because:IV treatments appropriate due to intensity of illness or inability to take PO and Inpatient level of care appropriate due to severity of illness   Dispo: The patient is from: Home              Anticipated d/c is to: Home              Anticipated d/c date is: > 3 days              Patient currently is not medically stable to d/c.    Medications:  Scheduled: . vitamin C  500 mg Oral Daily  . enoxaparin (LOVENOX) injection  40 mg Subcutaneous Q12H  . famotidine  20 mg Oral Daily  . feeding supplement (ENSURE ENLIVE)  237 mL Oral BID BM  . furosemide  40 mg Intravenous Once  . guaiFENesin  600 mg Oral BID  . mouth rinse  15 mL Mouth Rinse BID  . methylPREDNISolone (SOLU-MEDROL) injection  60 mg Intravenous BID  . zinc sulfate  220 mg Oral Daily   Continuous:  ONG:[id number]:acetaminophen, albuterol, guaiFENesin-dextromethorphan, [DISCONTINUED] ondansetron **OR** ondansetron (ZOFRAN) IV, sodium chloride   Objective:  Vital Signs  Vitals:   03/21/20 0424 03/21/20 0445 03/21/20 0738 03/21/20 1009  BP: 91/63 91/63 119/86   Pulse: 93 96 92   Resp: 20 20 (!) 22   Temp: 98.7 F (37.1 C)  98.3 F (36.8 C)   TempSrc: Axillary  Oral   SpO2: 96% 95% 96% 96%  Weight:      Height:  Intake/Output Summary (Last 24 hours) at 03/21/2020 1118 Last data filed at 03/21/2020 0423 Gross per 24 hour  Intake 120 ml  Output 550 ml  Net -430 ml   Filed Weights   03/08/20 1111 03/09/20 1006 03/14/20 0434  Weight: 101.6 kg  101.6 kg 93.1 kg    Exam  Awake Alert, No new F.N deficits, Normal affect [place].AT,PERRAL Supple Neck,No JVD, No cervical lymphadenopathy appriciated.  Symmetrical Chest wall movement, Good air movement bilaterally, minimal rales RRR,No Gallops, Rubs or new Murmurs, No Parasternal Heave +ve B.Sounds, Abd Soft, No tenderness, No organomegaly appriciated, No rebound - guarding or rigidity. No Cyanosis, Clubbing or edema, No new Rash or bruise   Lab Results:  Data Reviewed: I have personally reviewed following labs and imaging studies  CBC: Recent Labs  Lab 03/15/20 0626 03/16/20 0318 03/19/20 0706 03/20/20 0550 03/21/20 0613  WBC 12.0* 13.1* 10.8* 13.3* 12.9*  NEUTROABS  --   --  9.6* 12.1* 11.8*  HGB 12.9* 12.9* 12.5* 11.8* 11.1*  HCT 40.8 39.8 40.1 37.4* 35.2*  MCV 97.4 96.6 96.6 95.2 94.6  PLT 519* 557* 525* 478* 402*    Basic Metabolic Panel: Recent Labs  Lab 03/17/20 0404 03/18/20 0437 03/19/20 0706 03/20/20 0550 03/20/20 0926 03/21/20 0613  NA 135 135 134*  --  136 133*  K 4.7 4.6 5.5*  --  3.9 4.5  CL 97* 97* 98  --  97* 96*  CO2 28 28 26   --  30 27  GLUCOSE 140* 136* 127*  --  147* 152*  BUN 31* 27* 26*  --  25* 24*  CREATININE 0.96 0.84 0.76  --  0.86 0.67  CALCIUM 8.7* 8.5* 8.6*  --  8.5* 8.4*  MG  --   --  2.3 2.2  --  2.2    GFR: Estimated Creatinine Clearance: 104.4 mL/min (by C-G formula based on SCr of 0.67 mg/dL).  Liver Function Tests: Recent Labs  Lab 03/16/20 0318 03/17/20 0404 03/18/20 0437 03/19/20 0706 03/21/20 0613  AST 20 18 21 31 26   ALT 33 31 33 46* 52*  ALKPHOS 67 62 61 60 67  BILITOT 0.7 0.9 0.6 0.7 1.1  PROT 7.1 6.8 6.6 6.7 6.0*  ALBUMIN 2.3* 2.3* 2.2* 2.3* 2.3*     Recent Results (from the past 240 hour(s))  MRSA PCR Screening     Status: None   Collection Time: 03/11/20 10:21 PM   Specimen: Nasal Mucosa; Nasopharyngeal  Result Value Ref Range Status   MRSA by PCR NEGATIVE NEGATIVE Final    Comment:        The  GeneXpert MRSA Assay (FDA approved for NASAL specimens only), is one component of a comprehensive MRSA colonization surveillance program. It is not intended to diagnose MRSA infection nor to guide or monitor treatment for MRSA infections. Performed at [facility], [address] [place], [address] [place]       Radiology Studies: DG Chest Port 1 View  Result Date: 03/20/2020 CLINICAL DATA:  Shortness of breath.  COVID positive. EXAM: PORTABLE CHEST 1 VIEW COMPARISON:  03/16/2020 FINDINGS: 0858 hours. Low volume film. Peripherally predominant patchy airspace disease again noted, minimally improved in the interval. Interval improvement in retrocardiac left base collapse/consolidation. No pleural effusion or evidence of pneumothorax. Cardiopericardial silhouette is at upper limits of normal for size. The visualized bony structures of the thorax show no acute abnormality. Telemetry leads overlie the chest. IMPRESSION: Low volume film with slight interval improvement in  bilateral patchy airspace disease. Interval improvement in left basilar aeration. Electronically Signed   By: Kennith Center M.D.   On: 03/20/2020 09:22       LOS: 12 days   Kevontae Burgoon  [facility] Pager on [email or web address]  03/21/2020, 11:18 AM

## 2020-03-21 NOTE — Progress Notes (Signed)
Physical Therapy Treatment Patient Details Name: Tyler Jackson MRN: [id number] DOB: 04/05/1951 Today's Date: 03/21/2020    History of Present Illness Pt is a 69 y.o. male admitted 03/08/20 with worsening SOB and cough; workup for hyoxemia, tested (+) COVID-19. CT angiogram negative for PE, but imaging showed extensive bilateral opacities. No significant PMH.   PT Comments    Pt slowly progressing with mobility. Tolerates short bouts of sitting and standing activity with min guard for balance; pt limited by anxiety regarding SOB and decreased activity tolerance. SpO2 >/94% on 35L O2 HHFNC at 80% FiO2 + 15L NRB (pt adamantly declined trialing removing NRB mask this session). Will continue to follow acutely.    Follow Up Recommendations  Home health PT;Supervision for mobility/OOB     Equipment Recommendations   (TBD)    Recommendations for Other Services       Precautions / Restrictions Precautions Precautions: Fall;Other (comment) Precaution Comments: Watch HR; HHFNC + NRB Restrictions Weight Bearing Restrictions: No    Mobility  Bed Mobility Overal bed mobility: Modified Independent Bed Mobility: Supine to Sit           General bed mobility comments: Pt attempting to elevate HOB even more, encouraged to keep flatter, able to perform bed mobility mod indep; increased time to rest once sitting EOB  Transfers Overall transfer level: Needs assistance Equipment used: None Transfers: Sit to/from Stand Sit to Stand: Min guard         General transfer comment: Stood from bed and repeated sit<>stands from recliner with min guard for safety with lines, reliant on UE support to push into standing  Ambulation/Gait Ambulation/Gait assistance: Min guard Gait Distance (Feet): 2 Feet Assistive device: None (UE support on bedside table) Gait Pattern/deviations: Step-to pattern;Trunk flexed     General Gait Details: Steps to recliner, then 1x bout marching in place within  confines of HHFNC/NRB lines; pt hesitant/anxious to mobilize further secondary to fatigue and SOB   Stairs             Wheelchair Mobility    Modified Rankin (Stroke Patients Only)       Balance Overall balance assessment: Needs assistance Sitting-balance support: No upper extremity supported;Feet supported Sitting balance-Leahy Scale: Fair       Standing balance-Leahy Scale: Fair Standing balance comment: Can static stand without UE support, pt using table for UE support with marching in place                            Cognition Arousal/Alertness: Awake/alert Behavior During Therapy: [facility] for tasks assessed/performed;Anxious Overall Cognitive Status: Impaired/Different from baseline Area of Impairment: Attention;Following commands;Problem solving                   Current Attention Level: Sustained;Selective   Following Commands: Follows one step commands consistently;Follows multi-step commands consistently;Follows multi-step commands with increased time     Problem Solving: Requires verbal cues General Comments: Admits to being overwhelmed/anxious by lines/O2 needs and amount of information he's been getting from medical team. Requires frequent redirection and encouragement      Exercises Other Exercises Other Exercises: 6x repeated sit<>stand, pt requiring 30-sec seated rest every 2x standing; prolonged seated rest after all 6x, then marching in place for 45-sec; pt declining additional standing/seated therex secondary to fatigue/SOB    General Comments General comments (skin integrity, edema, etc.): Pt declined removing NRB this session despite encouragement. SpO2 94-100% on 35L HHFNC 80% FiO2 +  15L NRB; HR up to 120s with activity      Pertinent Vitals/Pain Pain Assessment: No/denies pain    Home Living                      Prior Function            PT Goals (current goals can now be found in the care plan section)  Progress towards PT goals: Progressing toward goals    Frequency           PT Plan Current plan remains appropriate    Co-evaluation              AM-PAC PT "6 Clicks" Mobility   Outcome Measure  Help needed turning from your back to your side while in a flat bed without using bedrails?: None Help needed moving from lying on your back to sitting on the side of a flat bed without using bedrails?: None Help needed moving to and from a bed to a chair (including a wheelchair)?: A Little Help needed standing up from a chair using your arms (e.g., wheelchair or bedside chair)?: A Little Help needed to walk in hospital room?: A Little Help needed climbing 3-5 steps with a railing? : A Lot 6 Click Score: 19    End of Session Equipment Utilized During Treatment: Oxygen Activity Tolerance: Patient tolerated treatment well;Patient limited by fatigue Patient left: in chair;with call bell/phone within reach Nurse Communication: Mobility status PT Visit Diagnosis: Other abnormalities of gait and mobility (R26.89);Muscle weakness (generalized) (M62.81)     Time: 1610-9604 PT Time Calculation (min) (ACUTE ONLY): 31 min  Charges:  $Therapeutic Exercise: 23-37 mins                     Tyler Jackson, PT, DPT Acute Rehabilitation Services  Pager [phone] Office [phone]  Tyler Jackson 03/21/2020, 5:11 PM

## 2020-03-22 ENCOUNTER — Inpatient Hospital Stay (HOSPITAL_COMMUNITY): Payer: Medicare Other

## 2020-03-22 DIAGNOSIS — R7989 Other specified abnormal findings of blood chemistry: Secondary | ICD-10-CM

## 2020-03-22 LAB — COMPREHENSIVE METABOLIC PANEL
ALT: 42 U/L (ref 0–44)
AST: 25 U/L (ref 15–41)
Albumin: 2.3 g/dL — ABNORMAL LOW (ref 3.5–5.0)
Alkaline Phosphatase: 68 U/L (ref 38–126)
Anion gap: 12 (ref 5–15)
BUN: 38 mg/dL — ABNORMAL HIGH (ref 8–23)
CO2: 25 mmol/L (ref 22–32)
Calcium: 8.6 mg/dL — ABNORMAL LOW (ref 8.9–10.3)
Chloride: 97 mmol/L — ABNORMAL LOW (ref 98–111)
Creatinine, Ser: 0.79 mg/dL (ref 0.61–1.24)
GFR, Estimated: >60 mL/min (ref >60)
Glucose, Bld: 190 mg/dL — ABNORMAL HIGH (ref 70–99)
Potassium: 4.6 mmol/L (ref 3.5–5.1)
Sodium: 134 mmol/L — ABNORMAL LOW (ref 135–145)
Total Bilirubin: 0.6 mg/dL (ref 0.3–1.2)
Total Protein: 6.5 g/dL (ref 6.5–8.1)

## 2020-03-22 LAB — CBC
HCT: 33.8 % — ABNORMAL LOW (ref 39.0–52.0)
Hemoglobin: 11.2 g/dL — ABNORMAL LOW (ref 13.0–17.0)
MCH: 30.9 pg (ref 26.0–34.0)
MCHC: 33.1 g/dL (ref 30.0–36.0)
MCV: 93.4 fL (ref 80.0–100.0)
Platelets: 288 10*3/uL (ref 150–400)
RBC: 3.62 MIL/uL — ABNORMAL LOW (ref 4.22–5.81)
RDW: 13.1 % (ref 11.5–15.5)
WBC: 17.6 10*3/uL — ABNORMAL HIGH (ref 4.0–10.5)
nRBC: 0 % (ref 0.0–0.2)

## 2020-03-22 LAB — PROCALCITONIN: Procalcitonin: 0.13 ng/mL

## 2020-03-22 LAB — CBC WITH DIFFERENTIAL/PLATELET
Abs Immature Granulocytes: 0.11 10*3/uL — ABNORMAL HIGH (ref 0.00–0.07)
Basophils Absolute: 0 10*3/uL (ref 0.0–0.1)
Basophils Relative: 0 %
Eosinophils Absolute: 0 10*3/uL (ref 0.0–0.5)
Eosinophils Relative: 0 %
HCT: 31.4 % — ABNORMAL LOW (ref 39.0–52.0)
Hemoglobin: 10.2 g/dL — ABNORMAL LOW (ref 13.0–17.0)
Immature Granulocytes: 1 %
Lymphocytes Relative: 3 %
Lymphs Abs: 0.6 10*3/uL — ABNORMAL LOW (ref 0.7–4.0)
MCH: 30.4 pg (ref 26.0–34.0)
MCHC: 32.5 g/dL (ref 30.0–36.0)
MCV: 93.7 fL (ref 80.0–100.0)
Monocytes Absolute: 0.8 10*3/uL (ref 0.1–1.0)
Monocytes Relative: 4 %
Neutro Abs: 17.2 10*3/uL — ABNORMAL HIGH (ref 1.7–7.7)
Neutrophils Relative %: 92 %
Platelets: 405 10*3/uL — ABNORMAL HIGH (ref 150–400)
RBC: 3.35 MIL/uL — ABNORMAL LOW (ref 4.22–5.81)
RDW: 13 % (ref 11.5–15.5)
WBC: 18.7 10*3/uL — ABNORMAL HIGH (ref 4.0–10.5)
nRBC: 0 % (ref 0.0–0.2)

## 2020-03-22 LAB — D-DIMER, QUANTITATIVE: D-Dimer, Quant: 0.61 ug/mL-FEU — ABNORMAL HIGH (ref 0.00–0.50)

## 2020-03-22 LAB — MAGNESIUM: Magnesium: 2.1 mg/dL (ref 1.7–2.4)

## 2020-03-22 LAB — C-REACTIVE PROTEIN: CRP: 1.3 mg/dL — ABNORMAL HIGH (ref <1.0)

## 2020-03-22 LAB — BRAIN NATRIURETIC PEPTIDE: B Natriuretic Peptide: 50.4 pg/mL (ref 0.0–100.0)

## 2020-03-22 MED ORDER — LACTATED RINGERS IV SOLN: INTRAVENOUS | Status: AC

## 2020-03-22 NOTE — Progress Notes (Signed)
Notified Dr. Leafy Half that at 2300 pt was on the [facility] and had large BM. Pt appeared to vasovagal on BSC, pt lost consciousness but still had pulse. Pt assisted back to bed by staff and pt is talking like he was before the episode. Pt is A&Ox4. Pt's VS stable after getting pt back in bed. EKG performed per MD order. EKG showed Sinus Tach HR 114. Pt resting in bed now. Will continue to monitor pt. Tacey Ruiz, RN

## 2020-03-22 NOTE — Progress Notes (Signed)
Bilateral lower extremity venous duplex has been completed. Preliminary results can be found in CV Proc through chart review.   03/22/20 11:44 AM Olen Cordial RVT

## 2020-03-22 NOTE — Progress Notes (Signed)
Patient had a large liquid black/dark red BM this morning. Patient denies pain but says his stomach "feels a little funny." Dr. Thedore Mins notified who ordered CBC in one hour. Will continue to monitor.

## 2020-03-22 NOTE — Progress Notes (Signed)
Secure chatted Dr. Thedore Mins om reference to pt. RED MEWS score.  He placed order to administer 100 ml/hr or LR for 5 hrs.

## 2020-03-22 NOTE — Progress Notes (Signed)
   03/22/20 1954  Assess: MEWS Score  Temp 98 F (36.7 C)  BP 95/69  Pulse Rate (!) 109  ECG Heart Rate (!) 109  Resp 20  Level of Consciousness Alert  SpO2 100 %  O2 Device HFNC;Non-rebreather Mask  Patient Activity (if Appropriate) In bed  Heater temperature 88.2 F (31.2 C)  O2 Flow Rate (L/min) 30 L/min  Assess: MEWS Score  MEWS Temp 0  MEWS Systolic 1  MEWS Pulse 1  MEWS RR 0  MEWS LOC 0  MEWS Score 2  MEWS Score Color Yellow  Assess: if the MEWS score is Yellow or Red  Were vital signs taken at a resting state? Yes  Focused Assessment No change from prior assessment  Early Detection of Sepsis Score *See Row Information* Medium  MEWS guidelines implemented *See Row Information* No, previously red, continue vital signs every 4 hours  Treat  Pain Scale 0-10  Pain Score 0  Take Vital Signs  Increase Vital Sign Frequency  Red: Q 1hr X 4 then Q 4hr X 4, if remains red, continue Q 4hrs

## 2020-03-22 NOTE — Progress Notes (Signed)
Occupational Therapy Treatment Patient Details Name: Tyler Jackson MRN: [id number] DOB: 12-09-1950 Today's Date: 03/22/2020    History of present illness Pt is a 69 y.o. male admitted 03/08/20 with worsening SOB and cough; workup for hyoxemia, tested (+) COVID-19. CT angiogram negative for PE, but imaging showed extensive bilateral opacities. No significant PMH.   OT comments  Pt initially declining working with OT but with encouragement is agreeable to bed level activity. Pt engaging in bil UE/LE exercise for continued strengthening/endurance. Use of level 2 theraband for UB HEP. Noted pt with decreased STM and often requiring cues/reminders of which exercise he is performing when carrying over to the other side. Pt on heated HFNC (60%, 30L) and NRB with SpO2 >95% throughout, max RR noted 38 briefly with bed level activity, max HR noted 116 bpm. Pt to benefit from continued acute OT services and continue to recommend post acute rehab services to progress his overall safety and independence with ADL and mobility.    Follow Up Recommendations  SNF;Supervision/Assistance - 24 hour    Equipment Recommendations  3 in 1 bedside commode          Precautions / Restrictions Precautions Precautions: Fall;Other (comment) Precaution Comments: Watch HR; HHFNC + NRB Restrictions Weight Bearing Restrictions: No       Mobility Bed Mobility               General bed mobility comments: pt would not attempt EOb/OOB today                            ADL either performed or assessed with clinical judgement   ADL Overall ADL's : Needs assistance/impaired                                       General ADL Comments: pt only agreeable to bed level HEP today               Cognition Arousal/Alertness: Awake/alert Behavior During Therapy: WFL for tasks assessed/performed;Anxious Overall Cognitive Status: Impaired/Different from baseline Area of Impairment:  Attention;Following commands;Problem solving;Memory                   Current Attention Level: Sustained;Selective Memory: Decreased short-term memory Following Commands: Follows one step commands consistently;Follows multi-step commands consistently;Follows multi-step commands with increased time     Problem Solving: Requires verbal cues General Comments: pt often forgetting which exercise he was performing. would complete on one side and then require cues to recall/perform on the other side despite having just performed it         Exercises Exercises: General Upper Extremity;General Lower Extremity General Exercises - Upper Extremity Shoulder Flexion: AROM;Both;10 reps;Theraband Theraband Level (Shoulder Flexion): Level 2 (Red) Shoulder Horizontal ABduction: AROM;Both;10 reps;Theraband Theraband Level (Shoulder Horizontal Abduction): Level 2 (Red) Shoulder Horizontal ADduction: AAROM;Both;Theraband;10 reps Theraband Level (Shoulder Horizontal Adduction): Level 2 (Red) Elbow Flexion: AROM;Both;10 reps;Theraband Theraband Level (Elbow Flexion): Level 2 (Red) Elbow Extension: AROM;Both;10 reps;Theraband Theraband Level (Elbow Extension): Level 2 (Red) General Exercises - Lower Extremity Ankle Circles/Pumps: AROM;Both;10 reps Straight Leg Raises: AROM;Both;10 reps;Supine Hip Flexion/Marching: AROM;AAROM;Both;10 reps;Supine   Shoulder Instructions       General Comments      Pertinent Vitals/ Pain       Pain Assessment: Faces Faces Pain Scale: No hurt Pain Intervention(s): Monitored during session  Home Living  Prior Functioning/Environment              Frequency  Min 2X/week        Progress Toward Goals  OT Goals(current goals can now be found in the care plan section)  Progress towards OT goals: OT to reassess next treatment  Acute Rehab OT Goals Patient Stated Goal: to get stronger, breathe  better OT Goal Formulation: With patient Time For Goal Achievement: 04/03/20 Potential to Achieve Goals: Good ADL Goals Pt Will Perform Grooming: with supervision;standing Pt Will Perform Upper Body Dressing: with modified independence;sitting Pt Will Perform Lower Body Dressing: with supervision;sit to/from stand Pt Will Transfer to Toilet: with supervision;ambulating Pt Will Perform Toileting - Clothing Manipulation and hygiene: with supervision;sit to/from stand Pt/caregiver will Perform Home Exercise Program: Right Upper extremity;Left upper extremity;With theraband;With written HEP provided;Independently Additional ADL Goal #1: Pt will recall and implement at least 3 ways to conserve energy during ADL routine with no cues  Plan Discharge plan remains appropriate    Co-evaluation                 AM-PAC OT "6 Clicks" Daily Activity     Outcome Measure   Help from another person eating meals?: A Little Help from another person taking care of personal grooming?: A Little Help from another person toileting, which includes using toliet, bedpan, or urinal?: A Little Help from another person bathing (including washing, rinsing, drying)?: A Lot Help from another person to put on and taking off regular upper body clothing?: A Little Help from another person to put on and taking off regular lower body clothing?: A Lot 6 Click Score: 16    End of Session Equipment Utilized During Treatment: Oxygen  OT Visit Diagnosis: Other abnormalities of gait and mobility (R26.89);Muscle weakness (generalized) (M62.81);Other symptoms and signs involving cognitive function   Activity Tolerance Patient limited by fatigue   Patient Left in bed;with call bell/phone within reach   Nurse Communication Mobility status        Time: 1537-1601 OT Time Calculation (min): 24 min  Charges: OT General Charges $OT Visit: 1 Visit OT Treatments $Therapeutic Activity: 23-37 mins  Marcy Siren,  OT Acute Rehabilitation Services Pager [phone] Office [phone]    Orlando Penner 03/22/2020, 5:19 PM

## 2020-03-22 NOTE — Progress Notes (Signed)
   03/22/20 0509  Assess: MEWS Score  Temp 97.9 F (36.6 C)  BP 100/72  Pulse Rate (!) 105  ECG Heart Rate (!) 105  Resp 20  Level of Consciousness Alert  SpO2 95 %  O2 Device HFNC;Non-rebreather Mask  O2 Flow Rate (L/min) 30 L/min  Assess: MEWS Score  MEWS Temp 0  MEWS Systolic 1  MEWS Pulse 1  MEWS RR 0  MEWS LOC 0  MEWS Score 2  MEWS Score Color Yellow  Assess: if the MEWS score is Yellow or Red  Were vital signs taken at a resting state? Yes  Focused Assessment No change from prior assessment  Early Detection of Sepsis Score *See Row Information* Medium  MEWS guidelines implemented *See Row Information* Yes  Treat  MEWS Interventions Other (Comment);Escalated (See documentation below) (heart rate elevated)  Take Vital Signs  Increase Vital Sign Frequency  Yellow: Q 2hr X 2 then Q 4hr X 2, if remains yellow, continue Q 4hrs  Escalate  MEWS: Escalate Yellow: discuss with charge nurse/RN and consider discussing with provider and RRT  Notify: Charge Nurse/RN  Name of Charge Nurse/RN Notified Edwina Barth RN   Date Charge Nurse/RN Notified 03/22/20  Time Charge Nurse/RN Notified [phone]

## 2020-03-22 NOTE — Progress Notes (Signed)
   03/22/20 0919  Assess: MEWS Score  Temp 97.8 F (36.6 C)  BP 100/80  Pulse Rate (!) 115  Resp (!) 21  Assess: MEWS Score  MEWS Temp 0  MEWS Systolic 1  MEWS Pulse 2  MEWS RR 1  MEWS LOC 0  MEWS Score 4  MEWS Score Color Red  Assess: if the MEWS score is Yellow or Red  Were vital signs taken at a resting state? Yes  Focused Assessment No change from prior assessment  Early Detection of Sepsis Score *See Row Information* Medium  MEWS guidelines implemented *See Row Information* Yes  Treat  MEWS Interventions Administered scheduled meds/treatments;Escalated (See documentation below)  Take Vital Signs  Increase Vital Sign Frequency  Red: Q 1hr X 4 then Q 4hr X 4, if remains red, continue Q 4hrs  Escalate  MEWS: Escalate Red: discuss with charge nurse/RN and provider, consider discussing with RRT  Notify: Charge Nurse/RN  Name of Charge Nurse/RN Notified Elisa  Date Charge Nurse/RN Notified 03/22/20  Time Charge Nurse/RN Notified 0919  Notify: Provider  Provider Name/Title Thedore Mins  Date Provider Notified 03/22/20  Time Provider Notified [phone]  Notification Type Page  Notification Reason Change in status  Response No new orders  Date of Provider Response 03/22/20  Time of Provider Response 0930

## 2020-03-23 ENCOUNTER — Inpatient Hospital Stay (HOSPITAL_COMMUNITY): Payer: Medicare Other

## 2020-03-23 LAB — COMPREHENSIVE METABOLIC PANEL
ALT: 39 U/L (ref 0–44)
AST: 22 U/L (ref 15–41)
Albumin: 2.3 g/dL — ABNORMAL LOW (ref 3.5–5.0)
Alkaline Phosphatase: 60 U/L (ref 38–126)
Anion gap: 10 (ref 5–15)
BUN: 31 mg/dL — ABNORMAL HIGH (ref 8–23)
CO2: 26 mmol/L (ref 22–32)
Calcium: 8.4 mg/dL — ABNORMAL LOW (ref 8.9–10.3)
Chloride: 100 mmol/L (ref 98–111)
Creatinine, Ser: 0.86 mg/dL (ref 0.61–1.24)
GFR, Estimated: >60 mL/min (ref >60)
Glucose, Bld: 189 mg/dL — ABNORMAL HIGH (ref 70–99)
Potassium: 4.2 mmol/L (ref 3.5–5.1)
Sodium: 136 mmol/L (ref 135–145)
Total Bilirubin: 0.5 mg/dL (ref 0.3–1.2)
Total Protein: 5.9 g/dL — ABNORMAL LOW (ref 6.5–8.1)

## 2020-03-23 LAB — CBC WITH DIFFERENTIAL/PLATELET
Abs Immature Granulocytes: 0.1 10*3/uL — ABNORMAL HIGH (ref 0.00–0.07)
Basophils Absolute: 0 10*3/uL (ref 0.0–0.1)
Basophils Relative: 0 %
Eosinophils Absolute: 0 10*3/uL (ref 0.0–0.5)
Eosinophils Relative: 0 %
HCT: 29 % — ABNORMAL LOW (ref 39.0–52.0)
Hemoglobin: 9.4 g/dL — ABNORMAL LOW (ref 13.0–17.0)
Immature Granulocytes: 1 %
Lymphocytes Relative: 2 %
Lymphs Abs: 0.4 10*3/uL — ABNORMAL LOW (ref 0.7–4.0)
MCH: 30.4 pg (ref 26.0–34.0)
MCHC: 32.4 g/dL (ref 30.0–36.0)
MCV: 93.9 fL (ref 80.0–100.0)
Monocytes Absolute: 0.5 10*3/uL (ref 0.1–1.0)
Monocytes Relative: 3 %
Neutro Abs: 17.5 10*3/uL — ABNORMAL HIGH (ref 1.7–7.7)
Neutrophils Relative %: 94 %
Platelets: 334 10*3/uL (ref 150–400)
RBC: 3.09 MIL/uL — ABNORMAL LOW (ref 4.22–5.81)
RDW: 13.3 % (ref 11.5–15.5)
WBC: 18.5 10*3/uL — ABNORMAL HIGH (ref 4.0–10.5)
nRBC: 0 % (ref 0.0–0.2)

## 2020-03-23 LAB — BRAIN NATRIURETIC PEPTIDE: B Natriuretic Peptide: 50.5 pg/mL (ref 0.0–100.0)

## 2020-03-23 LAB — TSH: TSH: 0.755 u[IU]/mL (ref 0.350–4.500)

## 2020-03-23 LAB — C-REACTIVE PROTEIN: CRP: 0.7 mg/dL (ref <1.0)

## 2020-03-23 LAB — MAGNESIUM: Magnesium: 2.1 mg/dL (ref 1.7–2.4)

## 2020-03-23 LAB — D-DIMER, QUANTITATIVE: D-Dimer, Quant: 0.61 ug/mL-FEU — ABNORMAL HIGH (ref 0.00–0.50)

## 2020-03-23 LAB — PROCALCITONIN: Procalcitonin: 0.15 ng/mL

## 2020-03-23 MED ORDER — LEVOFLOXACIN 750 MG PO TABS
750.0000 mg | ORAL_TABLET | Freq: Every day | ORAL | Status: DC
  Administered 2020-03-24 – 2020-03-25 (×2): 750 mg via ORAL
  Filled 2020-03-23 (×5): qty 1

## 2020-03-23 MED ORDER — LACTATED RINGERS IV SOLN: INTRAVENOUS | Status: DC

## 2020-03-23 MED ORDER — LOPERAMIDE HCL 2 MG PO CAPS
2.0000 mg | ORAL_CAPSULE | Freq: Four times a day (QID) | ORAL | Status: DC | PRN
  Administered 2020-03-23 – 2020-03-24 (×2): 2 mg via ORAL
  Filled 2020-03-23 (×2): qty 1

## 2020-03-23 MED ORDER — ENOXAPARIN SODIUM 40 MG/0.4ML ~~LOC~~ SOLN
40.0000 mg | SUBCUTANEOUS | Status: DC
  Administered 2020-03-24: 40 mg via SUBCUTANEOUS
  Filled 2020-03-23: qty 0.4

## 2020-03-23 MED ORDER — ENSURE ENLIVE PO LIQD
237.0000 mL | Freq: Three times a day (TID) | ORAL | Status: DC
  Administered 2020-03-27 – 2020-03-30 (×4): 237 mL via ORAL

## 2020-03-23 MED ORDER — PROSOURCE PLUS PO LIQD
30.0000 mL | Freq: Two times a day (BID) | ORAL | Status: DC
  Administered 2020-03-25 – 2020-04-02 (×12): 30 mL via ORAL
  Filled 2020-03-23 (×13): qty 30

## 2020-03-23 NOTE — Plan of Care (Signed)
  Problem: Clinical Measurements: Goal: Respiratory complications will improve Outcome: Progressing Goal: Cardiovascular complication will be avoided Outcome: Progressing   Problem: Coping: Goal: Level of anxiety will decrease Outcome: Progressing   Problem: Safety: Goal: Ability to remain free from injury will improve Outcome: Progressing   

## 2020-03-23 NOTE — Progress Notes (Signed)
PROGRESS NOTE  Tyler Jackson YKD:[id number] DOB: [birth year] DOA: 03/08/2020  PCP: Patient, No Pcp Per  Brief History/Interval Summary: 69 y.o. male with no significant past medical history presented to emergency department with worsening shortness of breath and cough since 1 week. He is not vaccinated against COVID-19.  He was noted to be tachycardic tachypneic and hypoxemic.  Requiring high flow nasal cannula.  COVID-19 test came back positive.  CT angiogram was negative for PE but showed extensive bilateral opacities.  Patient was hospitalized for further management.  Reason for Visit: Acute respiratory failure with hypoxia.  Pneumonia due to COVID-19  Consultants: None  Procedures: None  Antibiotics: Anti-infectives (From admission, onward)   Start     Dose/Rate Route Frequency Ordered Stop   03/23/20 0830  levofloxacin (LEVAQUIN) tablet 750 mg        750 mg Oral Daily 03/23/20 0736 03/27/20 0959   03/09/20 1000  remdesivir 100 mg in sodium chloride 0.9 % 100 mL IVPB        100 mg 200 mL/hr over 30 Minutes Intravenous Daily 03/08/20 1338 03/12/20 0948   03/08/20 1645  cefTRIAXone (ROCEPHIN) 1 g in sodium chloride 0.9 % 100 mL IVPB        1 g 200 mL/hr over 30 Minutes Intravenous Every 24 hours 03/08/20 1640 03/12/20 1630   03/08/20 1645  azithromycin (ZITHROMAX) 500 mg in sodium chloride 0.9 % 250 mL IVPB        500 mg 250 mL/hr over 60 Minutes Intravenous Every 24 hours 03/08/20 1640 03/13/20 1644   03/08/20 1430  remdesivir 100 mg in sodium chloride 0.9 % 100 mL IVPB       "Followed by" Linked Group Details   100 mg 200 mL/hr over 30 Minutes Intravenous  Once 03/08/20 1338 03/08/20 1508   03/08/20 1400  remdesivir 100 mg in sodium chloride 0.9 % 100 mL IVPB       "Followed by" Linked Group Details   100 mg 200 mL/hr over 30 Minutes Intravenous  Once 03/08/20 1338 03/08/20 1434      Subjective/Interval History:  Patient in bed, appears comfortable, denies any headache,  no fever, no chest pain or pressure, no shortness of breath , no abdominal pain. No focal weakness. Mild diarrhea.      Assessment/Plan:  Acute Hypoxic Resp. Failure/Pneumonia due to COVID-19  - unfortunately he is unvaccinated and has incurred severe lung parenchymal injury from Covid PNA, being treated with IV Steroids, Remdesivir and Baricitinib, continue supportive care, encouraged to sit up in chair in daytime use I-S and flutter valve for pulmonary toiletry, patient noncompliant with recommendations has been counseled multiple times.  Prone at night if possible.  Still extremely tenuous.  Using as needed Lasix as needed, dose on 03/21/2020.  Unfortunately patient remains noncompliant with recommendations.  Since he has developed a mild productive cough will order Levaquin for 4 days along with sputum Gram stain and culture.   SpO2: 95 % O2 Flow Rate (L/min): 35 L/min FiO2 (%): 70 %   Recent Labs  Lab 03/18/20 0437 03/19/20 0705 03/19/20 0706 03/19/20 0706 03/20/20 0549 03/20/20 0550 03/20/20 0926 03/21/20 0500 03/21/20 0613 03/22/20 0725 03/22/20 1018 03/23/20 0235  WBC  --   --  10.8*   < >  --  13.3*  --   --  12.9* 18.7* 17.6* 18.5*  CRP  --   --  1.7*  --   --  2.2*  --   --  2.1* 1.3*  --  0.7  DDIMER  --   --  1.02*  --   --  1.08*  --   --  0.91* 0.61*  --  0.61*  BNP  --  33.5  --   --  45.0  --   --  29.9  --  50.4  --  50.5  PROCALCITON  --   --   --   --   --   --  <0.10  --  <0.10 0.13  --  0.15  AST 21  --  31  --   --   --   --   --  26 25  --  22  ALT 33  --  46*  --   --   --   --   --  52* 42  --  39  ALKPHOS 61  --  60  --   --   --   --   --  67 68  --  60  BILITOT 0.6  --  0.7  --   --   --   --   --  1.1 0.6  --  0.5  ALBUMIN 2.2*  --  2.3*  --   --   --   --   --  2.3* 2.3*  --  2.3*   < > = values in this interval not displayed.     High D-dimer.  Due to inflammation, improved after high-dose Lovenox.  Leg ultrasound  unremarkable.  Hyperkalemia. Resolved after  Lasix, Lokelma and Kayexalate.  Monitor.    Thrombocytosis - Likely reactive due to COVID-19.  Improved.  Check periodically.  Leukocytosis - Secondary to steroids.  Stable.  Transaminitis - Secondary to COVID-19.  Resolved.  Normocytic anemia - Stable.  No evidence of overt bleeding.  Mild diarrhea ongoing from 03/22/2020.  As needed Imodium and gentle hydration, check TSH.    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 Incidentally noted on CT angiogram.  Annual imaging is recommended, follow with vascular surgery post discharge.      DVT Prophylaxis: Lovenox Code Status: Full code Family Communication:   Sister Eber Jones updated  [phone] - 03/18/20 Disposition Plan: Hopefully return home when improved  Status is: Inpatient  Remains inpatient appropriate because:IV treatments appropriate due to intensity of illness or inability to take PO and Inpatient level of care appropriate due to severity of illness   Dispo: The patient is from: Home              Anticipated d/c is to: Home              Anticipated d/c date is: > 3 days              Patient currently is not medically stable to d/c.    Medications:  Scheduled: . vitamin C  500 mg Oral Daily  . [START ON 03/24/2020] enoxaparin (LOVENOX) injection  40 mg Subcutaneous Q24H  . famotidine  20 mg Oral Daily  . feeding supplement  237 mL Oral BID BM  . guaiFENesin  600 mg Oral BID  . levofloxacin  750 mg Oral Daily  . mouth rinse  15 mL Mouth Rinse BID  . methylPREDNISolone (SOLU-MEDROL) injection  60 mg Intravenous BID  . zinc sulfate  220 mg Oral Daily   Continuous: . lactated ringers     TIR:[id number], albuterol, guaiFENesin-dextromethorphan, loperamide, [DISCONTINUED] ondansetron **OR** ondansetron (ZOFRAN)  IV, sodium chloride   Objective:  Vital Signs  Vitals:   03/23/20 0342 03/23/20 0805 03/23/20 0809 03/23/20  0900  BP: 90/70 97/70  99/74  Pulse: (!) 108 (!) 120  (!) 123  Resp: 20 (!) 24  19  Temp: 98.1 F (36.7 C) 97.6 F (36.4 C)  98.4 F (36.9 C)  TempSrc: Axillary Axillary  Axillary  SpO2: 100% 97% 97% 95%  Weight:      Height:        Intake/Output Summary (Last 24 hours) at 03/23/2020 1149 Last data filed at 03/23/2020 0647 Gross per 24 hour  Intake 440 ml  Output 450 ml  Net -10 ml   Filed Weights   03/08/20 1111 03/09/20 1006 03/14/20 0434  Weight: 101.6 kg 101.6 kg 93.1 kg    Exam  Awake Alert, No new F.N deficits, Normal affect [place].AT,PERRAL Supple Neck,No JVD, No cervical lymphadenopathy appriciated.  Symmetrical Chest wall movement, Good air movement bilaterally, CTAB RRR,No Gallops, Rubs or new Murmurs, No Parasternal Heave +ve B.Sounds, Abd Soft, No tenderness, No organomegaly appriciated, No rebound - guarding or rigidity. No Cyanosis, Clubbing or edema, No new Rash or bruise    Lab Results:  Data Reviewed: I have personally reviewed following labs and imaging studies  CBC: Recent Labs  Lab 03/19/20 0706 03/19/20 0706 03/20/20 0550 03/21/20 0613 03/22/20 0725 03/22/20 1018 03/23/20 0235  WBC 10.8*   < > 13.3* 12.9* 18.7* 17.6* 18.5*  NEUTROABS 9.6*  --  12.1* 11.8* 17.2*  --  17.5*  HGB 12.5*   < > 11.8* 11.1* 10.2* 11.2* 9.4*  HCT 40.1   < > 37.4* 35.2* 31.4* 33.8* 29.0*  MCV 96.6   < > 95.2 94.6 93.7 93.4 93.9  PLT 525*   < > 478* 402* 405* 288 334   < > = values in this interval not displayed.    Basic Metabolic Panel: Recent Labs  Lab 03/19/20 0706 03/20/20 0550 03/20/20 0926 03/21/20 0613 03/22/20 0725 03/23/20 0235  NA 134*  --  136 133* 134* 136  K 5.5*  --  3.9 4.5 4.6 4.2  CL 98  --  97* 96* 97* 100  CO2 26  --  GLUCOSE 127*  --  147* 152* 190* 189*  BUN 26*  --  25* 24* 38* 31*  CREATININE 0.76  --  0.86 0.67 0.79 0.86  CALCIUM 8.6*  --  8.5* 8.4* 8.6* 8.4*  MG 2.3 2.2  --  2.2 2.1 2.1    GFR: Estimated  Creatinine Clearance: 97.1 mL/min (by C-G formula based on SCr of 0.86 mg/dL).  Liver Function Tests: Recent Labs  Lab 03/18/20 0437 03/19/20 0706 03/21/20 0613 03/22/20 0725 03/23/20 0235  AST ALT 33 46* 52* 42 39  ALKPHOS 61 60 67 68 60  BILITOT 0.6 0.7 1.1 0.6 0.5  PROT 6.6 6.7 6.0* 6.5 5.9*  ALBUMIN 2.2* 2.3* 2.3* 2.3* 2.3*   No results found for: TSH   No results found for this or any previous visit (from the past 240 hour(s)).    Radiology Studies: DG Chest Port 1 View  Result Date: 03/23/2020 CLINICAL DATA:  Shortness of breath.  COVID positive. EXAM: PORTABLE CHEST 1 VIEW COMPARISON:  03/20/2020 FINDINGS: 0800 hours. Low lung volumes. Peripheral and basilar predominant patchy ground-glass airspace disease is again noted without substantial interval change. Cardiopericardial silhouette is at upper limits of normal for size. The visualized  bony structures of the thorax show no acute abnormality. Telemetry leads overlie the chest. IMPRESSION: Persistent peripheral and basilar predominant patchy ground-glass airspace disease. No substantial change. Electronically Signed   By: Kennith CenterEric  Mansell M.D.   On: 03/23/2020 08:23   VAS US LOWER EXTREMITY VENOUS (DVT)  Result Date: 03/22/2020  Lower Venous DVT Study Indications: Elevated Ddimer.  Risk Factors: COVID 19 positive. Comparison Study: No prior studies. Performing Technologist: Chanda BusingGregory Collins RVT  Examination Guidelines: A complete evaluation includes B-mode imaging, spectral Doppler, color Doppler, and power Doppler as needed of all accessible portions of each vessel. Bilateral testing is considered an integral part of a complete examination. Limited examinations for reoccurring indications may be performed as noted. The reflux portion of the exam is performed with the patient in reverse Trendelenburg.  +---------+---------------+---------+-----------+----------+--------------+ RIGHT     CompressibilityPhasicitySpontaneityPropertiesThrombus Aging +---------+---------------+---------+-----------+----------+--------------+ CFV      Full           Yes      Yes                                 +---------+---------------+---------+-----------+----------+--------------+ SFJ      Full                                                        +---------+---------------+---------+-----------+----------+--------------+ FV Prox  Full                                                        +---------+---------------+---------+-----------+----------+--------------+ FV Mid   Full                                                        +---------+---------------+---------+-----------+----------+--------------+ FV DistalFull                                                        +---------+---------------+---------+-----------+----------+--------------+ PFV      Full                                                        +---------+---------------+---------+-----------+----------+--------------+ POP      Full           Yes      Yes                                 +---------+---------------+---------+-----------+----------+--------------+ PTV      Full                                                        +---------+---------------+---------+-----------+----------+--------------+  PERO     Full                                                        +---------+---------------+---------+-----------+----------+--------------+   +---------+---------------+---------+-----------+----------+--------------+ LEFT     CompressibilityPhasicitySpontaneityPropertiesThrombus Aging +---------+---------------+---------+-----------+----------+--------------+ CFV      Full           Yes      Yes                                 +---------+---------------+---------+-----------+----------+--------------+ SFJ      Full                                                         +---------+---------------+---------+-----------+----------+--------------+ FV Prox  Full                                                        +---------+---------------+---------+-----------+----------+--------------+ FV Mid   Full                                                        +---------+---------------+---------+-----------+----------+--------------+ FV DistalFull                                                        +---------+---------------+---------+-----------+----------+--------------+ PFV      Full                                                        +---------+---------------+---------+-----------+----------+--------------+ POP      Full           Yes      Yes                                 +---------+---------------+---------+-----------+----------+--------------+ PTV      Full                                                        +---------+---------------+---------+-----------+----------+--------------+ PERO     Full                                                        +---------+---------------+---------+-----------+----------+--------------+  Summary: RIGHT: - There is no evidence of deep vein thrombosis in the lower extremity.  - No cystic structure found in the popliteal fossa.  LEFT: - There is no evidence of deep vein thrombosis in the lower extremity.  - No cystic structure found in the popliteal fossa.  *See table(s) above for measurements and observations. Electronically signed by Gretta Began MD on 03/22/2020 at 3:08:20 PM.    Final      LOS: 14 days   Susa Raring  [facility] Pager on [email or web address]  03/23/2020, 11:49 AM

## 2020-03-23 NOTE — Progress Notes (Signed)
   03/23/20 1500  Assess: MEWS Score  Temp 98.4 F (36.9 C)  BP 100/76  Pulse Rate (!) 114  ECG Heart Rate (!) 113  Resp (!) 21  SpO2 98 %  Assess: MEWS Score  MEWS Temp 0  MEWS Systolic 1  MEWS Pulse 2  MEWS RR 1  MEWS LOC 0  MEWS Score 4  MEWS Score Color Red  Assess: if the MEWS score is Yellow or Red  Were vital signs taken at a resting state? Yes  Focused Assessment No change from prior assessment  Early Detection of Sepsis Score *See Row Information* Low  MEWS guidelines implemented *See Row Information* Yes  Treat  MEWS Interventions Administered scheduled meds/treatments

## 2020-03-23 NOTE — Progress Notes (Addendum)
Nutrition Follow-up  DOCUMENTATION CODES:   Not applicable  INTERVENTION:  Provide Ensure Enlive po TID, each supplement provides 350 kcal and 20 grams of protein.  Provide 30 ml Prosource Plus po BID, each supplement provides 100 kcal and 15 grams of protein.   Encourage adequate PO intake.   NUTRITION DIAGNOSIS:   Increased nutrient needs related to catabolic illness (COVID) as evidenced by estimated needs; ongoing  GOAL:   Patient will meet greater than or equal to 90% of their needs; progressing  MONITOR:   PO intake, Supplement acceptance, Skin, Weight trends, Labs, I & O's  REASON FOR ASSESSMENT:   Malnutrition Screening Tool    ASSESSMENT:   69 y.o. male with no significant past medical history presents with shortness of breath and cough. Pt COVID positive. Pt with acute hypoxic resp. failure/pneumonia due to COVID-19  Pt is currently on 35 L HFNC. Meal completion has been 20-50%. Pt with diarrhea. Imodium has been ordered. RD to increase Ensure and will additionally order Prostat to aid in increased caloric and protein needs.   Labs and medications reviewed.   Diet Order:   Diet Order            DIET SOFT Room service appropriate? Yes; Fluid consistency: Thin  Diet effective now                 EDUCATION NEEDS:   Not appropriate for education at this time  Skin:  Skin Assessment: Reviewed RN Assessment  Last BM:  10/14  Height:   Ht Readings from Last 1 Encounters:  03/09/20 5\' 11"  (1.803 m)    Weight:   Wt Readings from Last 1 Encounters:  03/14/20 93.1 kg   BMI:  Body mass index is 28.63 kg/m.  Estimated Nutritional Needs:   Kcal:  2200-2400  Protein:  110-120 grams  Fluid:  >/= 2 L/day  05/14/20, MS, RD, LDN RD pager number/after hours weekend pager number on Amion.

## 2020-03-23 NOTE — Progress Notes (Signed)
Physical Therapy Treatment Patient Details Name: Tyler Jackson MRN: [id number] DOB: July 17, 1950 Today's Date: 03/23/2020    History of Present Illness Pt is a 69 y.o. male admitted 03/08/20 with worsening SOB and cough; workup for hyoxemia, tested (+) COVID-19. CT angiogram negative for PE, but imaging showed extensive bilateral opacities. No significant PMH.    PT Comments    Pt supine in bed on entry, with complaints of diarrhea limiting his ability to participate. Pt reports currently using rectal pouch. Tried to encourage pt to participate and when moving bed linens rectal pouch noted to be failing. Pt is mod I for rolling for pericare, however experiences multiple increases in HR >135 bpm with a max noted of 147 bpm. D/c plans remain appropriate, however may need to be reassessed. PT will continue to follow acutely.    Follow Up Recommendations  Home health PT;Supervision for mobility/OOB     Equipment Recommendations   (TBD)       Precautions / Restrictions Precautions Precautions: Fall;Other (comment) Precaution Comments: Watch HR; HHFNC + NRB Restrictions Weight Bearing Restrictions: No    Mobility  Bed Mobility Overal bed mobility: Modified Independent Bed Mobility: Rolling Rolling: Modified independent (Device/Increase time)         General bed mobility comments: pt with leaking rectal pouch on entry, mod I for rolling R and L for pericare and able to assist with pulling up in the bed           Cognition Arousal/Alertness: Awake/alert Behavior During Therapy: [facility] for tasks assessed/performed;Anxious Overall Cognitive Status: Impaired/Different from baseline Area of Impairment: Attention;Following commands;Problem solving;Memory                   Current Attention Level: Sustained;Selective Memory: Decreased short-term memory Following Commands: Follows one step commands consistently;Follows multi-step commands consistently;Follows multi-step commands  with increased time     Problem Solving: Requires verbal cues General Comments: difficulty with command follow with pericare in order to not increase fouled area      Exercises      General Comments General comments (skin integrity, edema, etc.): Pt on 35 L HHFNC 60% FiO2, and intermittent use of NRB 15L, able to maintain SaO2 >90%2, HR 110-147bpm with rolling in bed       Pertinent Vitals/Pain Pain Assessment: Faces Faces Pain Scale: No hurt           PT Goals (current goals can now be found in the care plan section) Acute Rehab PT Goals Patient Stated Goal: to get stronger, breathe better PT Goal Formulation: With patient Time For Goal Achievement: 04/02/20 Potential to Achieve Goals: Good Progress towards PT goals: Not progressing toward goals - comment (increased HR, and diarrhea limiting session )    Frequency    Min 3X/week      PT Plan Current plan remains appropriate       AM-PAC PT "6 Clicks" Mobility   Outcome Measure  Help needed turning from your back to your side while in a flat bed without using bedrails?: None Help needed moving from lying on your back to sitting on the side of a flat bed without using bedrails?: None Help needed moving to and from a bed to a chair (including a wheelchair)?: A Little Help needed standing up from a chair using your arms (e.g., wheelchair or bedside chair)?: A Little Help needed to walk in hospital room?: A Little Help needed climbing 3-5 steps with a railing? : A Lot 6 Click Score: 19  End of Session Equipment Utilized During Treatment: Oxygen Activity Tolerance: Patient limited by fatigue Patient left: with call bell/phone within reach;in bed Nurse Communication: Mobility status PT Visit Diagnosis: Other abnormalities of gait and mobility (R26.89);Muscle weakness (generalized) (M62.81)     Time: 9629-5284 PT Time Calculation (min) (ACUTE ONLY): 26 min  Charges:  $Therapeutic Activity: 23-37 mins                      Sarita Hakanson B. Beverely Risen PT, DPT Acute Rehabilitation Services Pager [phone] Office [phone]    Elon Alas Fleet 03/23/2020, 5:46 PM

## 2020-03-24 ENCOUNTER — Inpatient Hospital Stay (HOSPITAL_COMMUNITY): Payer: Medicare Other

## 2020-03-24 DIAGNOSIS — I5031 Acute diastolic (congestive) heart failure: Secondary | ICD-10-CM

## 2020-03-24 LAB — PROCALCITONIN: Procalcitonin: 0.13 ng/mL

## 2020-03-24 LAB — CBC WITH DIFFERENTIAL/PLATELET
Abs Immature Granulocytes: 0.24 10*3/uL — ABNORMAL HIGH (ref 0.00–0.07)
Basophils Absolute: 0 10*3/uL (ref 0.0–0.1)
Basophils Relative: 0 %
Eosinophils Absolute: 0 10*3/uL (ref 0.0–0.5)
Eosinophils Relative: 0 %
HCT: 25.3 % — ABNORMAL LOW (ref 39.0–52.0)
Hemoglobin: 8.4 g/dL — ABNORMAL LOW (ref 13.0–17.0)
Immature Granulocytes: 1 %
Lymphocytes Relative: 4 %
Lymphs Abs: 0.8 10*3/uL (ref 0.7–4.0)
MCH: 31.5 pg (ref 26.0–34.0)
MCHC: 33.2 g/dL (ref 30.0–36.0)
MCV: 94.8 fL (ref 80.0–100.0)
Monocytes Absolute: 0.6 10*3/uL (ref 0.1–1.0)
Monocytes Relative: 3 %
Neutro Abs: 18.7 10*3/uL — ABNORMAL HIGH (ref 1.7–7.7)
Neutrophils Relative %: 92 %
Platelets: 386 10*3/uL (ref 150–400)
RBC: 2.67 MIL/uL — ABNORMAL LOW (ref 4.22–5.81)
RDW: 13.5 % (ref 11.5–15.5)
WBC: 20.4 10*3/uL — ABNORMAL HIGH (ref 4.0–10.5)
nRBC: 0.1 % (ref 0.0–0.2)

## 2020-03-24 LAB — D-DIMER, QUANTITATIVE: D-Dimer, Quant: 0.95 ug/mL-FEU — ABNORMAL HIGH (ref 0.00–0.50)

## 2020-03-24 LAB — BRAIN NATRIURETIC PEPTIDE: B Natriuretic Peptide: 53 pg/mL (ref 0.0–100.0)

## 2020-03-24 LAB — MAGNESIUM: Magnesium: 2 mg/dL (ref 1.7–2.4)

## 2020-03-24 LAB — COMPREHENSIVE METABOLIC PANEL
ALT: 65 U/L — ABNORMAL HIGH (ref 0–44)
AST: 47 U/L — ABNORMAL HIGH (ref 15–41)
Albumin: 2.3 g/dL — ABNORMAL LOW (ref 3.5–5.0)
Alkaline Phosphatase: 65 U/L (ref 38–126)
Anion gap: 8 (ref 5–15)
BUN: 28 mg/dL — ABNORMAL HIGH (ref 8–23)
CO2: 28 mmol/L (ref 22–32)
Calcium: 8.2 mg/dL — ABNORMAL LOW (ref 8.9–10.3)
Chloride: 97 mmol/L — ABNORMAL LOW (ref 98–111)
Creatinine, Ser: 0.84 mg/dL (ref 0.61–1.24)
GFR, Estimated: >60 mL/min (ref >60)
Glucose, Bld: 194 mg/dL — ABNORMAL HIGH (ref 70–99)
Potassium: 4.2 mmol/L (ref 3.5–5.1)
Sodium: 133 mmol/L — ABNORMAL LOW (ref 135–145)
Total Bilirubin: 0.6 mg/dL (ref 0.3–1.2)
Total Protein: 5.6 g/dL — ABNORMAL LOW (ref 6.5–8.1)

## 2020-03-24 LAB — ECHOCARDIOGRAM LIMITED
Area-P 1/2: 2.84 cm2
Height: 71 in
S' Lateral: 2.8 cm
Weight: 3283.97 oz

## 2020-03-24 LAB — C-REACTIVE PROTEIN: CRP: 0.6 mg/dL (ref <1.0)

## 2020-03-24 LAB — GLUCOSE, CAPILLARY: Glucose-Capillary: 203 mg/dL — ABNORMAL HIGH (ref 70–99)

## 2020-03-24 MED ORDER — PERFLUTREN LIPID MICROSPHERE
1.0000 mL | INTRAVENOUS | Status: AC | PRN
  Administered 2020-03-24: 3 mL via INTRAVENOUS
  Filled 2020-03-24: qty 10

## 2020-03-24 MED ORDER — LACTATED RINGERS IV BOLUS
500.0000 mL | Freq: Once | INTRAVENOUS | Status: AC
  Administered 2020-03-24: 500 mL via INTRAVENOUS

## 2020-03-24 MED ORDER — LACTATED RINGERS IV SOLN: INTRAVENOUS | Status: DC

## 2020-03-24 MED ORDER — LACTATED RINGERS IV SOLN: INTRAVENOUS | Status: AC

## 2020-03-24 NOTE — Progress Notes (Signed)
   03/24/20 1215  What Happened  Was fall witnessed? Yes  Who witnessed fall? Tia, NT  Patients activity before fall ambulating-assisted;to/from bed, chair, or stretcher  Point of contact hip/leg  Was patient injured? Unsure  Follow Up  MD notified Susa Raring MD  Time MD notified 1220  Family notified No - patient refusal  Additional tests Yes-comment (xray of L leg ordered)  Simple treatment Other (comment) (tylenol)  Progress note created (see row info) Yes  Adult Fall Risk Assessment  Risk Factor Category (scoring not indicated) Fall has occurred during this admission (document High fall risk)  Patient Fall Risk Level High fall risk  Adult Fall Risk Interventions  Required Bundle Interventions *See Row Information* High fall risk - low, moderate, and high requirements implemented  Additional Interventions Use of appropriate toileting equipment (bedpan, BSC, etc.);Assess orthostatic BP  Screening for Fall Injury Risk (To be completed on HIGH fall risk patients) - Assessing Need for Low Bed  Risk For Fall Injury- Low Bed Criteria Previous fall this admission  Will Implement Low Bed and Floor Mats Yes  Vitals  Temp 97.7 F (36.5 C)  Temp Source Oral  BP 129/64  MAP (mmHg) 84  BP Location Right Arm  BP Method Automatic  Patient Position (if appropriate) Lying  Pulse Rate Source Monitor  ECG Heart Rate (!) 115  Resp (!) 26  Oxygen Therapy  SpO2 (!) 87 %  O2 Device HFNC  O2 Flow Rate (L/min) 40 L/min  FiO2 (%) 50 %  PCA/Epidural/Spinal Assessment  Respiratory Pattern Regular;Unlabored  Neurological  Neuro (WDL) X  Level of Consciousness Alert  Orientation Level Oriented X4  Cognition Appropriate at baseline  Speech Clear  Neuro Symptoms None  Neuro Additional Assessments No  Glasgow Coma Scale  Eye Opening 4  Best Verbal Response (NON-intubated) 5  Best Motor Response 6  Glasgow Coma Scale Score 15  Musculoskeletal  Musculoskeletal (WDL) X  Assistive Device  BSC;Front wheel walker  Generalized Weakness Yes  Weight Bearing Restrictions No  Integumentary  Integumentary (WDL) X  Skin Color Appropriate for ethnicity  Skin Condition Dry  Skin Integrity Abrasion  Abrasion Location Leg  Abrasion Location Orientation Left  Skin Turgor Non-tenting    Patient was transferring from bed to chair with walker and NT support. When pt lifted his bottom off the bed, pt stated the "meds were making me loopy." Patient then started to fall forward, NT stopped the fall then patient began to fall the backwards. Patient assisted to floor by the NT's present. Once patient was on the floor, NT called for staff assistance. Upon arrival, patient was unresponsive but with a pulse. Patient immediately transferred off the floor to the bed by staff. Once patient was placed back in bed, patient became responsive with no reclination of what had just transpired. Vital signs stable. No obvious injuries noted besides left shin abrasion. Patient complaining of discomfort in his thigh area. Dr. Thedore Mins notified of the above and additional orders to be placed. Patient declining family notification of his fall. Will continue to monitor.

## 2020-03-24 NOTE — Progress Notes (Signed)
  Echocardiogram 2D Echocardiogram has been performed.  Tyler Jackson 03/24/2020, 3:17 PM

## 2020-03-24 NOTE — Progress Notes (Addendum)
PROGRESS NOTE  Tyler Jackson ZOX:[id number] DOB: Oct 02, 1950 DOA: 03/08/2020  PCP: Patient, No Pcp Per  Brief History/Interval Summary: 69 y.o. male with no significant past medical history presented to emergency department with worsening shortness of breath and cough since 1 week. He is not vaccinated against COVID-19.  He was noted to be tachycardic tachypneic and hypoxemic.  Requiring high flow nasal cannula.  COVID-19 test came back positive.  CT angiogram was negative for PE but showed extensive bilateral opacities.  Patient was hospitalized for further management.  Reason for Visit: Acute respiratory failure with hypoxia.  Pneumonia due to COVID-19  Consultants: None  Procedures: None  Antibiotics: Anti-infectives (From admission, onward)   Start     Dose/Rate Route Frequency Ordered Stop   03/23/20 0830  levofloxacin (LEVAQUIN) tablet 750 mg        750 mg Oral Daily 03/23/20 0736 03/27/20 0959   03/09/20 1000  remdesivir 100 mg in sodium chloride 0.9 % 100 mL IVPB        100 mg 200 mL/hr over 30 Minutes Intravenous Daily 03/08/20 1338 03/12/20 0948   03/08/20 1645  cefTRIAXone (ROCEPHIN) 1 g in sodium chloride 0.9 % 100 mL IVPB        1 g 200 mL/hr over 30 Minutes Intravenous Every 24 hours 03/08/20 1640 03/12/20 1630   03/08/20 1645  azithromycin (ZITHROMAX) 500 mg in sodium chloride 0.9 % 250 mL IVPB        500 mg 250 mL/hr over 60 Minutes Intravenous Every 24 hours 03/08/20 1640 03/13/20 1644   03/08/20 1430  remdesivir 100 mg in sodium chloride 0.9 % 100 mL IVPB       "Followed by" Linked Group Details   100 mg 200 mL/hr over 30 Minutes Intravenous  Once 03/08/20 1338 03/08/20 1508   03/08/20 1400  remdesivir 100 mg in sodium chloride 0.9 % 100 mL IVPB       "Followed by" Linked Group Details   100 mg 200 mL/hr over 30 Minutes Intravenous  Once 03/08/20 1338 03/08/20 1434      Subjective/Interval History:  Patient in bed, appears comfortable, denies any headache,  no fever, no chest pain or pressure, improved shortness of breath , no abdominal pain. No focal weakness.   Assessment/Plan:  Acute Hypoxic Resp. Failure/Pneumonia due to COVID-19  - unfortunately he is unvaccinated and has incurred severe lung parenchymal injury from Covid PNA, being treated with IV Steroids, Remdesivir and Baricitinib, continue supportive care, encouraged to sit up in chair in daytime use I-S and flutter valve for pulmonary toiletry, patient noncompliant with recommendations has been counseled multiple times.  Prone at night if possible.  Still extremely tenuous.  Using as needed Lasix as needed, dose on 03/21/2020.  Unfortunately patient remains noncompliant with recommendations.  Since he has developed a mild productive cough will order Levaquin for 4 days along with sputum Gram stain and culture.   SpO2: 90 % O2 Flow Rate (L/min): 35 L/min FiO2 (%): 60 %   Recent Labs  Lab 03/19/20 0705 03/19/20 0706 03/19/20 0706 03/20/20 0549 03/20/20 0550 03/20/20 0550 03/20/20 0926 03/21/20 0500 03/21/20 0613 03/22/20 0725 03/22/20 1018 03/23/20 0235 03/24/20 0201  WBC  --  10.8*   < >  --  13.3*   < >  --   --  12.9* 18.7* 17.6* 18.5* 20.4*  CRP  --  1.7*   < >  --  2.2*  --   --   --  2.1* 1.3*  --  0.7 0.6  DDIMER  --  1.02*   < >  --  1.08*  --   --   --  0.91* 0.61*  --  0.61* 0.95*  BNP   < >  --   --  45.0  --   --   --  29.9  --  50.4  --  50.5 53.0  PROCALCITON  --   --   --   --   --   --  <0.10  --  <0.10 0.13  --  0.15 0.13  AST  --  31  --   --   --   --   --   --  26 25  --  22 47*  ALT  --  46*  --   --   --   --   --   --  52* 42  --  39 65*  ALKPHOS  --  60  --   --   --   --   --   --  67 68  --  60 65  BILITOT  --  0.7  --   --   --   --   --   --  1.1 0.6  --  0.5 0.6  ALBUMIN  --  2.3*  --   --   --   --   --   --  2.3* 2.3*  --  2.3* 2.3*   < > = values in this interval not displayed.     High D-dimer.  Due to inflammation, improved after  high-dose Lovenox.  Leg ultrasound unremarkable.  Syncope and fall on 03/24/2020.  Mild left leg injury, x-ray left tibia-fibula and hip, gentle IV fluids, check orthostatics, check echocardiogram.    Hyperkalemia. Resolved after  Lasix, Lokelma and Kayexalate.  Monitor.    Thrombocytosis - Likely reactive due to COVID-19.  Improved.  Check periodically.  Leukocytosis - Secondary to steroids.  Stable.  Transaminitis - Secondary to COVID-19.  Resolved.  Normocytic anemia - Stable.  No evidence of overt bleeding.  Mild diarrhea ongoing from 03/22/2020.  As needed Imodium and gentle hydration, stable TSH.    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 Incidentally noted on CT angiogram.  Annual imaging is recommended, follow with vascular surgery post discharge.       DVT Prophylaxis: Lovenox Code Status: Full code Family Communication:   Sister Eber Jones updated  [phone] - 03/18/20, 03/24/2020 message left at 11:27 AM. Disposition Plan: Hopefully return home when improved  Status is: Inpatient  Remains inpatient appropriate because:IV treatments appropriate due to intensity of illness or inability to take PO and Inpatient level of care appropriate due to severity of illness   Dispo: The patient is from: Home              Anticipated d/c is to: Home              Anticipated d/c date is: > 3 days              Patient currently is not medically stable to d/c.    Medications:  Scheduled: . (feeding supplement) PROSource Plus  30 mL Oral BID BM  . vitamin C  500 mg Oral Daily  . enoxaparin (LOVENOX) injection  40 mg Subcutaneous Q24H  . famotidine  20 mg Oral Daily  . feeding supplement  237 mL Oral TID BM  .  guaiFENesin  600 mg Oral BID  . levofloxacin  750 mg Oral Daily  . mouth rinse  15 mL Mouth Rinse BID  . methylPREDNISolone (SOLU-MEDROL) injection  60 mg Intravenous BID  . zinc sulfate  220 mg Oral Daily   Continuous: .  lactated ringers 100 mL/hr at 03/24/20 0867   YPP:JKDTOIZTIWPYK, albuterol, guaiFENesin-dextromethorphan, loperamide, [DISCONTINUED] ondansetron **OR** ondansetron (ZOFRAN) IV, sodium chloride   Objective:  Vital Signs  Vitals:   03/24/20 0402 03/24/20 0806 03/24/20 0821 03/24/20 0832  BP: 109/69 107/76    Pulse: (!) 102 (!) 105  (!) 124  Resp: (!) 21 20  (!) 21  Temp: 97.8 F (36.6 C) 98 F (36.7 C)    TempSrc: Oral Oral    SpO2: 100% 99% (!) 87% 90%  Weight:      Height:        Intake/Output Summary (Last 24 hours) at 03/24/2020 1119 Last data filed at 03/24/2020 0616 Gross per 24 hour  Intake 180 ml  Output 225 ml  Net -45 ml   Filed Weights   03/08/20 1111 03/09/20 1006 03/14/20 0434  Weight: 101.6 kg 101.6 kg 93.1 kg    Exam  Awake Alert, No new F.N deficits, Normal affect [place].AT,PERRAL Supple Neck,No JVD, No cervical lymphadenopathy appriciated.  Symmetrical Chest wall movement, Good air movement bilaterally, CTAB RRR,No Gallops, Rubs or new Murmurs, No Parasternal Heave +ve B.Sounds, Abd Soft, No tenderness, No organomegaly appriciated, No rebound - guarding or rigidity. No Cyanosis, Clubbing or edema, No new Rash or bruise   Lab Results:  Data Reviewed: I have personally reviewed following labs and imaging studies  CBC: Recent Labs  Lab 03/20/20 0550 03/20/20 0550 03/21/20 0613 03/22/20 0725 03/22/20 1018 03/23/20 0235 03/24/20 0201  WBC 13.3*   < > 12.9* 18.7* 17.6* 18.5* 20.4*  NEUTROABS 12.1*  --  11.8* 17.2*  --  17.5* 18.7*  HGB 11.8*   < > 11.1* 10.2* 11.2* 9.4* 8.4*  HCT 37.4*   < > 35.2* 31.4* 33.8* 29.0* 25.3*  MCV 95.2   < > 94.6 93.7 93.4 93.9 94.8  PLT 478*   < > 402* 405* 288 334 386   < > = values in this interval not displayed.    Basic Metabolic Panel: Recent Labs  Lab 03/19/20 0706 03/20/20 0550 03/20/20 0926 03/21/20 0613 03/22/20 0725 03/23/20 0235 03/24/20 0201  NA   < >  --  136 133* 134* 136 133*  K   < >  --   3.9 4.5 4.6 4.2 4.2  CL   < >  --  97* 96* 97* 100 97*  CO2   < >  --  30 27 25 26 28   GLUCOSE   < >  --  147* 152* 190* 189* 194*  BUN   < >  --  25* 24* 38* 31* 28*  CREATININE   < >  --  0.86 0.67 0.79 0.86 0.84  CALCIUM   < >  --  8.5* 8.4* 8.6* 8.4* 8.2*  MG  --  2.2  --  2.2 2.1 2.1 2.0   < > = values in this interval not displayed.    GFR: Estimated Creatinine Clearance: 99.5 mL/min (by C-G formula based on SCr of 0.84 mg/dL).  Liver Function Tests: Recent Labs  Lab 03/19/20 0706 03/21/20 0613 03/22/20 0725 03/23/20 0235 03/24/20 0201  AST 31 26 25 22  47*  ALT 46* 52* 42 39 65*  ALKPHOS 60 67  68 60 65  BILITOT 0.7 1.1 0.6 0.5 0.6  PROT 6.7 6.0* 6.5 5.9* 5.6*  ALBUMIN 2.3* 2.3* 2.3* 2.3* 2.3*   Lab Results  Component Value Date   TSH 0.755 03/23/2020     No results found for this or any previous visit (from the past 240 hour(s)).    Radiology Studies: DG Chest Port 1 View  Result Date: 03/23/2020 CLINICAL DATA:  Shortness of breath.  COVID positive. EXAM: PORTABLE CHEST 1 VIEW COMPARISON:  03/20/2020 FINDINGS: 0800 hours. Low lung volumes. Peripheral and basilar predominant patchy ground-glass airspace disease is again noted without substantial interval change. Cardiopericardial silhouette is at upper limits of normal for size. The visualized bony structures of the thorax show no acute abnormality. Telemetry leads overlie the chest. IMPRESSION: Persistent peripheral and basilar predominant patchy ground-glass airspace disease. No substantial change. Electronically Signed   By: Kennith Center M.D.   On: 03/23/2020 08:23   VAS [place] LOWER EXTREMITY VENOUS (DVT)  Result Date: 03/22/2020  Lower Venous DVT Study Indications: Elevated Ddimer.  Risk Factors: COVID 19 positive. Comparison Study: No prior studies. Performing Technologist: Chanda Busing RVT  Examination Guidelines: A complete evaluation includes B-mode imaging, spectral Doppler, color Doppler, and power  Doppler as needed of all accessible portions of each vessel. Bilateral testing is considered an integral part of a complete examination. Limited examinations for reoccurring indications may be performed as noted. The reflux portion of the exam is performed with the patient in reverse Trendelenburg.  +---------+---------------+---------+-----------+----------+--------------+ RIGHT    CompressibilityPhasicitySpontaneityPropertiesThrombus Aging +---------+---------------+---------+-----------+----------+--------------+ CFV      Full           Yes      Yes                                 +---------+---------------+---------+-----------+----------+--------------+ SFJ      Full                                                        +---------+---------------+---------+-----------+----------+--------------+ FV Prox  Full                                                        +---------+---------------+---------+-----------+----------+--------------+ FV Mid   Full                                                        +---------+---------------+---------+-----------+----------+--------------+ FV DistalFull                                                        +---------+---------------+---------+-----------+----------+--------------+ PFV      Full                                                        +---------+---------------+---------+-----------+----------+--------------+  POP      Full           Yes      Yes                                 +---------+---------------+---------+-----------+----------+--------------+ PTV      Full                                                        +---------+---------------+---------+-----------+----------+--------------+ PERO     Full                                                        +---------+---------------+---------+-----------+----------+--------------+    +---------+---------------+---------+-----------+----------+--------------+ LEFT     CompressibilityPhasicitySpontaneityPropertiesThrombus Aging +---------+---------------+---------+-----------+----------+--------------+ CFV      Full           Yes      Yes                                 +---------+---------------+---------+-----------+----------+--------------+ SFJ      Full                                                        +---------+---------------+---------+-----------+----------+--------------+ FV Prox  Full                                                        +---------+---------------+---------+-----------+----------+--------------+ FV Mid   Full                                                        +---------+---------------+---------+-----------+----------+--------------+ FV DistalFull                                                        +---------+---------------+---------+-----------+----------+--------------+ PFV      Full                                                        +---------+---------------+---------+-----------+----------+--------------+ POP      Full           Yes      Yes                                 +---------+---------------+---------+-----------+----------+--------------+  PTV      Full                                                        +---------+---------------+---------+-----------+----------+--------------+ PERO     Full                                                        +---------+---------------+---------+-----------+----------+--------------+     Summary: RIGHT: - There is no evidence of deep vein thrombosis in the lower extremity.  - No cystic structure found in the popliteal fossa.  LEFT: - There is no evidence of deep vein thrombosis in the lower extremity.  - No cystic structure found in the popliteal fossa.  *See table(s) above for measurements and observations. Electronically signed  by Gretta Beganodd Early MD on 03/22/2020 at 3:08:20 PM.    Final      LOS: 15 days   Susa RaringPrashant Elmo Rio  [facility] Pager on [email or web address]  03/24/2020, 11:19 AM

## 2020-03-24 NOTE — Significant Event (Signed)
Rapid Response Event Note   Reason for Call :  Loss consciousness while transferring from bed to chair with NT. Pt lowered to floor by NT. Pt complaining of left leg pain.   Initial Focused Assessment:  Pt lying in bed, awake, oriented. He follows commands and moves all extremities appropriately. PERRLA, 33mm. Face symmetrical. Lung sounds are diminished. He endorses pain to his entire left leg. He is weaker in his left leg. Immobilize left leg until imaging is completed and results have been reviewed. Pt denies pain to any other area. He endorses feeling lightheaded.  VS: T 97.72F, BP 129/64, HR 115, RR 26, SpO2 87% on 40L/50% CBG 203  Interventions:  -MD notified order received for imaging of left leg  Plan of Care:  -Staff assist when up out of bed -Fall precautions  Call rapid response for additional needs.   Event Summary:  MD Notified: Dr. Thedore Mins Call Time: 1214 Arrival Time: 1220 End Time: 1235  Jennye Moccasin, RN

## 2020-03-24 NOTE — Progress Notes (Signed)
Orthostatics attempted on patient.  BP lying 94/67 BP sitting 107/75  While standing patient to obtain VS standing, patient lost consciousness again. Patient quickly back to a supine position and patient became alert once again. BP was checked at that time for 131/75.

## 2020-03-24 NOTE — Progress Notes (Signed)
   03/24/20 1156  Family/Significant Other Communication  Family/Significant Other Update Updated (pt's sister Eber Jones)

## 2020-03-25 LAB — CBC WITH DIFFERENTIAL/PLATELET
Abs Immature Granulocytes: 0.66 10*3/uL — ABNORMAL HIGH (ref 0.00–0.07)
Basophils Absolute: 0 10*3/uL (ref 0.0–0.1)
Basophils Relative: 0 %
Eosinophils Absolute: 0 10*3/uL (ref 0.0–0.5)
Eosinophils Relative: 0 %
HCT: 23.2 % — ABNORMAL LOW (ref 39.0–52.0)
Hemoglobin: 7.4 g/dL — ABNORMAL LOW (ref 13.0–17.0)
Immature Granulocytes: 3 %
Lymphocytes Relative: 4 %
Lymphs Abs: 0.9 10*3/uL (ref 0.7–4.0)
MCH: 30.8 pg (ref 26.0–34.0)
MCHC: 31.9 g/dL (ref 30.0–36.0)
MCV: 96.7 fL (ref 80.0–100.0)
Monocytes Absolute: 0.7 10*3/uL (ref 0.1–1.0)
Monocytes Relative: 3 %
Neutro Abs: 18.3 10*3/uL — ABNORMAL HIGH (ref 1.7–7.7)
Neutrophils Relative %: 90 %
Platelets: 386 10*3/uL (ref 150–400)
RBC: 2.4 MIL/uL — ABNORMAL LOW (ref 4.22–5.81)
RDW: 13.9 % (ref 11.5–15.5)
WBC: 20.6 10*3/uL — ABNORMAL HIGH (ref 4.0–10.5)
nRBC: 1.3 % — ABNORMAL HIGH (ref 0.0–0.2)

## 2020-03-25 LAB — COMPREHENSIVE METABOLIC PANEL
ALT: 106 U/L — ABNORMAL HIGH (ref 0–44)
AST: 55 U/L — ABNORMAL HIGH (ref 15–41)
Albumin: 2.2 g/dL — ABNORMAL LOW (ref 3.5–5.0)
Alkaline Phosphatase: 61 U/L (ref 38–126)
Anion gap: 7 (ref 5–15)
BUN: 19 mg/dL (ref 8–23)
CO2: 28 mmol/L (ref 22–32)
Calcium: 8.2 mg/dL — ABNORMAL LOW (ref 8.9–10.3)
Chloride: 99 mmol/L (ref 98–111)
Creatinine, Ser: 0.82 mg/dL (ref 0.61–1.24)
GFR, Estimated: >60 mL/min (ref >60)
Glucose, Bld: 194 mg/dL — ABNORMAL HIGH (ref 70–99)
Potassium: 4.3 mmol/L (ref 3.5–5.1)
Sodium: 134 mmol/L — ABNORMAL LOW (ref 135–145)
Total Bilirubin: 0.4 mg/dL (ref 0.3–1.2)
Total Protein: 5.2 g/dL — ABNORMAL LOW (ref 6.5–8.1)

## 2020-03-25 LAB — PROCALCITONIN: Procalcitonin: <0.1 ng/mL

## 2020-03-25 LAB — GLUCOSE, CAPILLARY: Glucose-Capillary: 114 mg/dL — ABNORMAL HIGH (ref 70–99)

## 2020-03-25 LAB — C-REACTIVE PROTEIN: CRP: 0.5 mg/dL (ref <1.0)

## 2020-03-25 LAB — MAGNESIUM: Magnesium: 2 mg/dL (ref 1.7–2.4)

## 2020-03-25 LAB — BRAIN NATRIURETIC PEPTIDE: B Natriuretic Peptide: 49.2 pg/mL (ref 0.0–100.0)

## 2020-03-25 LAB — D-DIMER, QUANTITATIVE: D-Dimer, Quant: 1.02 ug/mL-FEU — ABNORMAL HIGH (ref 0.00–0.50)

## 2020-03-25 MED ORDER — CLONAZEPAM 0.5 MG PO TABS
0.5000 mg | ORAL_TABLET | Freq: Two times a day (BID) | ORAL | Status: DC
  Administered 2020-03-25 – 2020-03-30 (×11): 0.5 mg via ORAL
  Filled 2020-03-25 (×11): qty 1

## 2020-03-25 MED ORDER — ENOXAPARIN SODIUM 40 MG/0.4ML ~~LOC~~ SOLN
40.0000 mg | Freq: Two times a day (BID) | SUBCUTANEOUS | Status: DC
  Administered 2020-03-25 (×2): 40 mg via SUBCUTANEOUS
  Filled 2020-03-25 (×2): qty 0.4

## 2020-03-25 MED ORDER — LACTATED RINGERS IV BOLUS
1000.0000 mL | Freq: Once | INTRAVENOUS | Status: AC
  Administered 2020-03-25: 1000 mL via INTRAVENOUS

## 2020-03-25 NOTE — Progress Notes (Addendum)
Patient's mews score yellow. NAD noted at this time. Notified charge nurse Rodman Pickle. Will continue to monitor, call bell within reach.   03/25/20 0738  Assess: MEWS Score  Temp 98.2 F (36.8 C)  BP 99/74  Pulse Rate (!) 104  ECG Heart Rate (!) 104  Resp 20  SpO2 94 %  Assess: MEWS Score  MEWS Temp 0  MEWS Systolic 1  MEWS Pulse 1  MEWS RR 0  MEWS LOC 0  MEWS Score 2  MEWS Score Color Yellow  Assess: if the MEWS score is Yellow or Red  Were vital signs taken at a resting state? Yes  Focused Assessment No change from prior assessment  Early Detection of Sepsis Score *See Row Information* Low  MEWS guidelines implemented *See Row Information* Yes  Treat  MEWS Interventions Administered scheduled meds/treatments  Take Vital Signs  Increase Vital Sign Frequency  Yellow: Q 2hr X 2 then Q 4hr X 2, if remains yellow, continue Q 4hrs  Escalate  MEWS: Escalate Yellow: discuss with charge nurse/RN and consider discussing with provider and RRT  Notify: Charge Nurse/RN  Name of Charge Nurse/RN Notified Rodman Pickle RN  Date Charge Nurse/RN Notified 03/25/20  Time Charge Nurse/RN Notified [phone]

## 2020-03-25 NOTE — Progress Notes (Addendum)
Patient's Mews score red. NAD noted at this time. Notified charge nurse Rodman Pickle and MD. Will continue to monitor.   03/25/20 0938  Assess: MEWS Score  Temp 98.1 F (36.7 C)  BP (!) 91/48  Pulse Rate (!) 101  ECG Heart Rate (!) 112  Resp (!) 24  SpO2 100 %  O2 Device HFNC  Patient Activity (if Appropriate) In bed  Heater temperature 91.2 F (32.9 C)  O2 Flow Rate (L/min) 40 L/min  FiO2 (%) 80 %  Assess: MEWS Score  MEWS Temp 0  MEWS Systolic 1  MEWS Pulse 2  MEWS RR 1  MEWS LOC 0  MEWS Score 4  MEWS Score Color Red  Assess: if the MEWS score is Yellow or Red  Were vital signs taken at a resting state? Yes  Focused Assessment No change from prior assessment  Early Detection of Sepsis Score *See Row Information* Low  MEWS guidelines implemented *See Row Information* Yes  Take Vital Signs  Increase Vital Sign Frequency  Red: Q 1hr X 4 then Q 4hr X 4, if remains red, continue Q 4hrs  Escalate  MEWS: Escalate Red: discuss with charge nurse/RN and provider, consider discussing with RRT  Notify: Charge Nurse/RN  Name of Charge Nurse/RN Notified Rodman Pickle RN  Date Charge Nurse/RN Notified 03/25/20  Time Charge Nurse/RN Notified 0945  Notify: Provider  Provider Name/Title Thedore Mins  Date Provider Notified 03/25/20  Time Provider Notified 1015  Notification Type Page (secure chat)  Notification Reason Other (Comment) (Red mews)  Response No new orders  Date of Provider Response 03/25/20  Time of Provider Response 1032

## 2020-03-25 NOTE — Progress Notes (Signed)
PROGRESS NOTE  Tyler Jackson SUG:[id number] DOB: 08/01/1950 DOA: 03/08/2020  PCP: Patient, No Pcp Per  Brief History/Interval Summary: 69 y.o. male with no significant past medical history presented to emergency department with worsening shortness of breath and cough since 1 week. He is not vaccinated against COVID-19.  He was noted to be tachycardic tachypneic and hypoxemic.  Requiring high flow nasal cannula.  COVID-19 test came back positive.  CT angiogram was negative for PE but showed extensive bilateral opacities.  Patient was hospitalized for further management.  Reason for Visit: Acute respiratory failure with hypoxia.  Pneumonia due to COVID-19  Consultants: None  Procedures:   TTE - 1. Left ventricular ejection fraction, by estimation, is 60 to 65%. The left ventricle has normal function. The left ventricle has no regional wall motion abnormalities. There is moderate left ventricular hypertrophy. Left ventricular diastolic parameters are indeterminate.  2. Right ventricular systolic function is normal. The right ventricular size is normal. Tricuspid regurgitation signal is inadequate for assessing PA pressure.  3. The mitral valve is normal in structure. No evidence of mitral valve regurgitation. No evidence of mitral stenosis.  4. The aortic valve is tricuspid. Aortic valve regurgitation is not visualized. No aortic stenosis is present.  5. The inferior vena cava is normal in size with greater than 50% respiratory variability, suggesting right atrial pressure of 3 mmHg  Antibiotics: Anti-infectives (From admission, onward)   Start     Dose/Rate Route Frequency Ordered Stop   03/23/20 0830  levofloxacin (LEVAQUIN) tablet 750 mg        750 mg Oral Daily 03/23/20 0736 03/27/20 0959   03/09/20 1000  remdesivir 100 mg in sodium chloride 0.9 % 100 mL IVPB        100 mg 200 mL/hr over 30 Minutes Intravenous Daily 03/08/20 1338 03/12/20 0948   03/08/20 1645  cefTRIAXone (ROCEPHIN) 1 g in  sodium chloride 0.9 % 100 mL IVPB        1 g 200 mL/hr over 30 Minutes Intravenous Every 24 hours 03/08/20 1640 03/12/20 1630   03/08/20 1645  azithromycin (ZITHROMAX) 500 mg in sodium chloride 0.9 % 250 mL IVPB        500 mg 250 mL/hr over 60 Minutes Intravenous Every 24 hours 03/08/20 1640 03/13/20 1644   03/08/20 1430  remdesivir 100 mg in sodium chloride 0.9 % 100 mL IVPB       "Followed by" Linked Group Details   100 mg 200 mL/hr over 30 Minutes Intravenous  Once 03/08/20 1338 03/08/20 1508   03/08/20 1400  remdesivir 100 mg in sodium chloride 0.9 % 100 mL IVPB       "Followed by" Linked Group Details   100 mg 200 mL/hr over 30 Minutes Intravenous  Once 03/08/20 1338 03/08/20 1434      Subjective/Interval History:  Patient in bed, appears comfortable, denies any headache, no fever, no chest pain or pressure, + shortness of breath , no abdominal pain. No focal weakness.    Assessment/Plan:  Acute Hypoxic Resp. Failure/Pneumonia due to COVID-19  - unfortunately he is unvaccinated and has incurred severe lung parenchymal injury from Covid PNA, being treated with IV Steroids, Remdesivir and Baricitinib, continue supportive care, encouraged to sit up in chair in daytime use I-S and flutter valve for pulmonary toiletry, patient noncompliant with recommendations has been counseled multiple times.  Prone at night if possible.  Still extremely tenuous.  Using as needed Lasix as needed, dose on 03/21/2020.  Unfortunately  patient remains noncompliant with recommendations.  Since he has developed a mild productive cough will order Levaquin for 4 days along with sputum Gram stain and culture.   SpO2: 100 % O2 Flow Rate (L/min): 40 L/min FiO2 (%): 80 %   Recent Labs  Lab 03/20/20 0550 03/21/20 0500 03/21/20 0613 03/21/20 0613 03/22/20 0725 03/22/20 1018 03/23/20 0235 03/24/20 0201 03/25/20 0141  WBC   < >  --  12.9*   < > 18.7* 17.6* 18.5* 20.4* 20.6*  CRP   < >  --  2.1*  --  1.3*   --  0.7 0.6 0.5  DDIMER   < >  --  0.91*  --  0.61*  --  0.61* 0.95* 1.02*  BNP  --  29.9  --   --  50.4  --  50.5 53.0 49.2  PROCALCITON   < >  --  <0.10  --  0.13  --  0.15 0.13 <0.10  AST  --   --  26  --  25  --  22 47* 55*  ALT  --   --  52*  --  42  --  39 65* 106*  ALKPHOS  --   --  67  --  68  --  60 65 61  BILITOT  --   --  1.1  --  0.6  --  0.5 0.6 0.4  ALBUMIN  --   --  2.3*  --  2.3*  --  2.3* 2.3* 2.2*   < > = values in this interval not displayed.     High D-dimer.  Due to inflammation, improved after high-dose Lovenox.  Leg ultrasound unremarkable.  Syncope and fall on 03/24/2020 due to orthostatic hypotension.  He had incurred some left leg injury, x-ray of left hip, tibia-fibula negative, no ongoing discomfort, echocardiogram stable, being hydrated with IV fluids, blood pressure improving will monitor.  Hyperkalemia. Resolved after  Lasix, Lokelma and Kayexalate.  Monitor.    Thrombocytosis - Likely reactive due to COVID-19.  Improved.  Check periodically.  Leukocytosis - Secondary to steroids.  Stable.  Transaminitis - Secondary to COVID-19.  Resolved.  Normocytic anemia - Stable.  No evidence of overt bleeding.  Mild diarrhea ongoing from 03/22/2020.  As needed Imodium and gentle hydration, stable TSH.    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 Incidentally noted on CT angiogram. Annual imaging is recommended, follow with vascular surgery post discharge.       DVT Prophylaxis: Lovenox Code Status: Full code Family Communication:   Sister Eber Jones updated  [id number] - 03/18/20, 03/24/2020 message left at 11:27 AM. Disposition Plan: Hopefully return home when improved  Status is: Inpatient  Remains inpatient appropriate because:IV treatments appropriate due to intensity of illness or inability to take PO and Inpatient level of care appropriate due to severity of illness   Dispo: The patient is from: Home               Anticipated d/c is to: Home              Anticipated d/c date is: > 3 days              Patient currently is not medically stable to d/c.    Medications:   Scheduled: . (feeding supplement) PROSource Plus  30 mL Oral BID BM  . vitamin C  500 mg Oral Daily  . clonazePAM  0.5 mg Oral BID  .  enoxaparin (LOVENOX) injection  40 mg Subcutaneous Q12H  . famotidine  20 mg Oral Daily  . feeding supplement  237 mL Oral TID BM  . guaiFENesin  600 mg Oral BID  . levofloxacin  750 mg Oral Daily  . mouth rinse  15 mL Mouth Rinse BID  . methylPREDNISolone (SOLU-MEDROL) injection  60 mg Intravenous BID  . zinc sulfate  220 mg Oral Daily   Continuous:  UXN:ATFTDDUKGURKY, albuterol, guaiFENesin-dextromethorphan, loperamide, [DISCONTINUED] ondansetron **OR** ondansetron (ZOFRAN) IV, sodium chloride   Objective:  Vital Signs  Vitals:   03/25/20 0738 03/25/20 0740 03/25/20 0938 03/25/20 1031  BP: 99/74 99/74 (!) 91/48 (!) 101/52  Pulse: (!) 104 100 (!) 101 98  Resp: 20 (!) 25 (!) 24 (!) 23  Temp: 98.2 F (36.8 C)  98.1 F (36.7 C) 98.1 F (36.7 C)  TempSrc: Oral   Oral  SpO2: 94% 96% 100% 100%  Weight:      Height:        Intake/Output Summary (Last 24 hours) at 03/25/2020 1053 Last data filed at 03/25/2020 0740 Gross per 24 hour  Intake 908.39 ml  Output 300 ml  Net 608.39 ml   Filed Weights   03/08/20 1111 03/09/20 1006 03/14/20 0434  Weight: 101.6 kg 101.6 kg 93.1 kg    Exam  Awake Alert, No new F.N deficits, Normal affect Freer.AT,PERRAL Supple Neck,No JVD, No cervical lymphadenopathy appriciated.  Symmetrical Chest wall movement, Good air movement bilaterally, CTAB RRR,No Gallops, Rubs or new Murmurs, No Parasternal Heave +ve B.Sounds, Abd Soft, No tenderness, No organomegaly appriciated, No rebound - guarding or rigidity. No Cyanosis, Clubbing or edema, No new Rash or bruise   Lab Results:  Data Reviewed: I have personally reviewed following labs and imaging  studies  CBC: Recent Labs  Lab 03/21/20 0613 03/21/20 0613 03/22/20 0725 03/22/20 1018 03/23/20 0235 03/24/20 0201 03/25/20 0141  WBC 12.9*   < > 18.7* 17.6* 18.5* 20.4* 20.6*  NEUTROABS 11.8*  --  17.2*  --  17.5* 18.7* 18.3*  HGB 11.1*   < > 10.2* 11.2* 9.4* 8.4* 7.4*  HCT 35.2*   < > 31.4* 33.8* 29.0* 25.3* 23.2*  MCV 94.6   < > 93.7 93.4 93.9 94.8 96.7  PLT 402*   < > 405* 288 334 386 386   < > = values in this interval not displayed.    Basic Metabolic Panel: Recent Labs  Lab 03/21/20 0613 03/22/20 0725 03/23/20 0235 03/24/20 0201 03/25/20 0141  NA 133* 134* 136 133* 134*  K 4.5 4.6 4.2 4.2 4.3  CL 96* 97* 100 97* 99  CO2 27 25 26 28 28   GLUCOSE 152* 190* 189* 194* 194*  BUN 24* 38* 31* 28* 19  CREATININE 0.67 0.79 0.86 0.84 0.82  CALCIUM 8.4* 8.6* 8.4* 8.2* 8.2*  MG 2.2 2.1 2.1 2.0 2.0    GFR: Estimated Creatinine Clearance: 101.9 mL/min (by C-G formula based on SCr of 0.82 mg/dL).  Liver Function Tests: Recent Labs  Lab 03/21/20 0613 03/22/20 0725 03/23/20 0235 03/24/20 0201 03/25/20 0141  AST 26 25 22  47* 55*  ALT 52* 42 39 65* 106*  ALKPHOS 67 68 60 65 61  BILITOT 1.1 0.6 0.5 0.6 0.4  PROT 6.0* 6.5 5.9* 5.6* 5.2*  ALBUMIN 2.3* 2.3* 2.3* 2.3* 2.2*   Lab Results  Component Value Date   TSH 0.755 03/23/2020     No results found for this or any previous visit (from the past 240 hour(s)).  Radiology Studies: DG Tibia/Fibula Left Port  Result Date: 03/24/2020 CLINICAL DATA:  fall Fall, left hip pain EXAM: PORTABLE LEFT TIBIA AND FIBULA - 2 VIEW COMPARISON:  None. FINDINGS: No fracture or bone lesion. Hip and knee joints are normally aligned. 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 Soft tissues are unremarkable. IMPRESSION: No fracture or acute finding Electronically Signed   By: Amie Portlandavid  Ormond M.D.   On: 03/24/2020 13:41   DG HIP UNILAT W OR W/O PELVIS MIN 4 VIEWS LEFT  Result Date:  03/24/2020 CLINICAL DATA:  Fall, left hip pain EXAM: DG HIP (WITH OR WITHOUT PELVIS) 4+V LEFT COMPARISON:  None. FINDINGS: No fracture or bone lesion. Hip joints are normally spaced and aligned as are the SI joints and symphysis pubis. Soft tissues are unremarkable. IMPRESSION: Negative. Electronically Signed   By: Amie Portlandavid  Ormond M.D.   On: 03/24/2020 13:42   ECHOCARDIOGRAM LIMITED  Result Date: 03/24/2020    ECHOCARDIOGRAM REPORT   Patient Name:   Tyler Jackson Date of Exam: 03/24/2020 Medical Rec #:  [id number]     Height:       71.0 in Accession #:    [id number]    Weight:       205.2 lb Date of Birth:  11/14/1950      BSA:          2.132 m Patient Age:    [age] years      BP:           127/74 mmHg Patient Gender: M             HR:           99 bpm. Exam Location:  Inpatient Procedure: Limited Echo, Cardiac Doppler, Color Doppler and Intracardiac            Opacification Agent Indications:    CHF-Acute Diastolic  History:        Patient has no prior history of Echocardiogram examinations.                 Risk Factors:Former Smoker. COVID-19.  Sonographer:    Ross LudwigArthur Guy RDCS (AE) Referring Phys: [id number] [place]ASH[place] K [facility]  Sonographer Comments: Technically difficult study due to poor echo windows, suboptimal parasternal window and suboptimal apical window. COVID-19. IMPRESSIONS  1. Left ventricular ejection fraction, by estimation, is 60 to 65%. The left ventricle has normal function. The left ventricle has no regional wall motion abnormalities. There is moderate left ventricular hypertrophy. Left ventricular diastolic parameters are indeterminate.  2. Right ventricular systolic function is normal. The right ventricular size is normal. Tricuspid regurgitation signal is inadequate for assessing PA pressure.  3. The mitral valve is normal in structure. No evidence of mitral valve regurgitation. No evidence of mitral stenosis.  4. The aortic valve is tricuspid. Aortic valve regurgitation is not visualized. No aortic  stenosis is present.  5. The inferior vena cava is normal in size with greater than 50% respiratory variability, suggesting right atrial pressure of 3 mmHg. FINDINGS  Left Ventricle: Left ventricular ejection fraction, by estimation, is 60 to 65%. The left ventricle has normal function. The left ventricle has no regional wall motion abnormalities. Definity contrast agent was given IV to delineate the left ventricular  endocardial borders. The left ventricular internal cavity size was normal in size. There is moderate left ventricular hypertrophy. Left ventricular diastolic parameters are indeterminate. Right Ventricle: The right ventricular size is normal. Right vetricular wall thickness was  not well visualized. Right ventricular systolic function is normal. Tricuspid regurgitation signal is inadequate for assessing PA pressure. Left Atrium: Left atrial size was normal in size. Right Atrium: Right atrial size was normal in size. Pericardium: There is no evidence of pericardial effusion. Mitral Valve: The mitral valve is normal in structure. No evidence of mitral valve regurgitation. No evidence of mitral valve stenosis. Tricuspid Valve: The tricuspid valve is normal in structure. Tricuspid valve regurgitation is trivial. No evidence of tricuspid stenosis. Aortic Valve: The aortic valve is tricuspid. Aortic valve regurgitation is not visualized. No aortic stenosis is present. Pulmonic Valve: The pulmonic valve was not well visualized. Pulmonic valve regurgitation is not visualized. No evidence of pulmonic stenosis. Aorta: The aortic root is normal in size and structure and the ascending aorta was not well visualized. Venous: The inferior vena cava is normal in size with greater than 50% respiratory variability, suggesting right atrial pressure of 3 mmHg. IAS/Shunts: No atrial level shunt detected by color flow Doppler.  LEFT VENTRICLE PLAX 2D LVIDd:         4.00 cm  Diastology LVIDs:         2.80 cm  LV e' medial:     7.40 cm/s LV PW:         1.50 cm  LV E/e' medial:  7.0 LV IVS:        1.40 cm  LV e' lateral:   7.40 cm/s LVOT diam:     2.30 cm  LV E/e' lateral: 7.0 LVOT Area:     4.15 cm  IVC IVC diam: 1.10 cm LEFT ATRIUM         Index LA diam:    4.40 cm 2.06 cm/m   AORTA Ao Root diam: 3.30 cm Ao Asc diam:  3.90 cm MITRAL VALVE MV Area (PHT): 2.84 cm    SHUNTS MV Decel Time: 267 msec    Systemic Diam: 2.30 cm MV E velocity: 51.90 cm/s MV A velocity: 78.10 cm/s MV E/A ratio:  0.66 Weston Brass MD Electronically signed by Weston Brass MD Signature Date/Time: 03/24/2020/4:28:07 PM    Final      LOS: 16 days   Susa Raring  [facility] Pager on [email or web address]  03/25/2020, 10:53 AM

## 2020-03-26 ENCOUNTER — Inpatient Hospital Stay (HOSPITAL_COMMUNITY): Payer: Medicare Other

## 2020-03-26 DIAGNOSIS — D62 Acute posthemorrhagic anemia: Secondary | ICD-10-CM

## 2020-03-26 LAB — CBC WITH DIFFERENTIAL/PLATELET
Abs Immature Granulocytes: 0.69 10*3/uL — ABNORMAL HIGH (ref 0.00–0.07)
Basophils Absolute: 0 10*3/uL (ref 0.0–0.1)
Basophils Relative: 0 %
Eosinophils Absolute: 0 10*3/uL (ref 0.0–0.5)
Eosinophils Relative: 0 %
HCT: 20 % — ABNORMAL LOW (ref 39.0–52.0)
Hemoglobin: 6.4 g/dL — CL (ref 13.0–17.0)
Immature Granulocytes: 4 %
Lymphocytes Relative: 9 %
Lymphs Abs: 1.6 10*3/uL (ref 0.7–4.0)
MCH: 31.1 pg (ref 26.0–34.0)
MCHC: 32 g/dL (ref 30.0–36.0)
MCV: 97.1 fL (ref 80.0–100.0)
Monocytes Absolute: 0.8 10*3/uL (ref 0.1–1.0)
Monocytes Relative: 4 %
Neutro Abs: 14.4 10*3/uL — ABNORMAL HIGH (ref 1.7–7.7)
Neutrophils Relative %: 83 %
Platelets: 348 10*3/uL (ref 150–400)
RBC: 2.06 MIL/uL — ABNORMAL LOW (ref 4.22–5.81)
RDW: 14.1 % (ref 11.5–15.5)
WBC: 17.5 10*3/uL — ABNORMAL HIGH (ref 4.0–10.5)
nRBC: 1.1 % — ABNORMAL HIGH (ref 0.0–0.2)

## 2020-03-26 LAB — COMPREHENSIVE METABOLIC PANEL
ALT: 97 U/L — ABNORMAL HIGH (ref 0–44)
AST: 40 U/L (ref 15–41)
Albumin: 2 g/dL — ABNORMAL LOW (ref 3.5–5.0)
Alkaline Phosphatase: 62 U/L (ref 38–126)
Anion gap: 5 (ref 5–15)
BUN: 16 mg/dL (ref 8–23)
CO2: 27 mmol/L (ref 22–32)
Calcium: 8 mg/dL — ABNORMAL LOW (ref 8.9–10.3)
Chloride: 100 mmol/L (ref 98–111)
Creatinine, Ser: 0.73 mg/dL (ref 0.61–1.24)
GFR, Estimated: >60 mL/min (ref >60)
Glucose, Bld: 163 mg/dL — ABNORMAL HIGH (ref 70–99)
Potassium: 4.4 mmol/L (ref 3.5–5.1)
Sodium: 132 mmol/L — ABNORMAL LOW (ref 135–145)
Total Bilirubin: 0.4 mg/dL (ref 0.3–1.2)
Total Protein: 4.5 g/dL — ABNORMAL LOW (ref 6.5–8.1)

## 2020-03-26 LAB — PROTIME-INR
INR: 1.1 (ref 0.8–1.2)
Prothrombin Time: 13.4 seconds (ref 11.4–15.2)

## 2020-03-26 LAB — BRAIN NATRIURETIC PEPTIDE: B Natriuretic Peptide: 74.3 pg/mL (ref 0.0–100.0)

## 2020-03-26 LAB — APTT: aPTT: 24 seconds (ref 24–36)

## 2020-03-26 LAB — OCCULT BLOOD X 1 CARD TO LAB, STOOL: Fecal Occult Bld: POSITIVE — AB

## 2020-03-26 LAB — C-REACTIVE PROTEIN: CRP: 0.6 mg/dL (ref <1.0)

## 2020-03-26 LAB — HEMOGLOBIN AND HEMATOCRIT, BLOOD
HCT: 29.7 % — ABNORMAL LOW (ref 39.0–52.0)
Hemoglobin: 9.8 g/dL — ABNORMAL LOW (ref 13.0–17.0)

## 2020-03-26 LAB — PREPARE RBC (CROSSMATCH)

## 2020-03-26 LAB — D-DIMER, QUANTITATIVE: D-Dimer, Quant: 0.8 ug/mL-FEU — ABNORMAL HIGH (ref 0.00–0.50)

## 2020-03-26 LAB — MAGNESIUM: Magnesium: 1.9 mg/dL (ref 1.7–2.4)

## 2020-03-26 LAB — ABO/RH: ABO/RH(D): O POS

## 2020-03-26 LAB — PROCALCITONIN: Procalcitonin: <0.1 ng/mL

## 2020-03-26 MED ORDER — IOHEXOL 300 MG/ML  SOLN
100.0000 mL | Freq: Once | INTRAMUSCULAR | Status: AC | PRN
  Administered 2020-03-26: 100 mL via INTRAVENOUS

## 2020-03-26 MED ORDER — SODIUM CHLORIDE 0.9% IV SOLUTION: Freq: Once | INTRAVENOUS | Status: AC

## 2020-03-26 MED ORDER — PANTOPRAZOLE SODIUM 40 MG IV SOLR
40.0000 mg | Freq: Two times a day (BID) | INTRAVENOUS | Status: DC
  Administered 2020-03-26 – 2020-03-29 (×7): 40 mg via INTRAVENOUS
  Filled 2020-03-26 (×7): qty 40

## 2020-03-26 MED ORDER — DIPHENHYDRAMINE HCL 50 MG/ML IJ SOLN: 25.0000 mg | Freq: Four times a day (QID) | INTRAMUSCULAR | Status: DC | PRN

## 2020-03-26 MED ORDER — METHYLPREDNISOLONE SODIUM SUCC 125 MG IJ SOLR
60.0000 mg | Freq: Every day | INTRAMUSCULAR | Status: DC
  Administered 2020-03-26: 60 mg via INTRAVENOUS
  Filled 2020-03-26: qty 2

## 2020-03-26 NOTE — Progress Notes (Signed)
CRITICAL VALUE ALERT  Critical Value:  Hgb 6.4  Date & Time Notied:  03-26-2020 @ 0533  Provider Notified: Loney Loh  Orders Received/Actions taken: none received.

## 2020-03-26 NOTE — Progress Notes (Signed)
PROGRESS NOTE  Tyler Jackson CWC:[id number] DOB: 11/13/1950 DOA: 03/08/2020  PCP: Patient, No Pcp Per  Brief History/Interval Summary: 69 y.o. male with no significant past medical history presented to emergency department with worsening shortness of breath and cough since 1 week. He is not vaccinated against COVID-19.  He was noted to be tachycardic tachypneic and hypoxemic.  Requiring high flow nasal cannula.  COVID-19 test came back positive.  CT angiogram was negative for PE but showed extensive bilateral opacities.  Patient was hospitalized for further management.  Reason for Visit: Acute respiratory failure with hypoxia.  Pneumonia due to COVID-19  Consultants: GI  Procedures:   CT -  TTE - 1. Left ventricular ejection fraction, by estimation, is 60 to 65%. The left ventricle has normal function. The left ventricle has no regional wall motion abnormalities. There is moderate left ventricular hypertrophy. Left ventricular diastolic parameters are indeterminate.  2. Right ventricular systolic function is normal. The right ventricular size is normal. Tricuspid regurgitation signal is inadequate for assessing PA pressure.  3. The mitral valve is normal in structure. No evidence of mitral valve regurgitation. No evidence of mitral stenosis.  4. The aortic valve is tricuspid. Aortic valve regurgitation is not visualized. No aortic stenosis is present.  5. The inferior vena cava is normal in size with greater than 50% respiratory variability, suggesting right atrial pressure of 3 mmHg  Antibiotics: Anti-infectives (From admission, onward)   Start     Dose/Rate Route Frequency Ordered Stop   03/23/20 0830  levofloxacin (LEVAQUIN) tablet 750 mg  Status:  Discontinued        750 mg Oral Daily 03/23/20 0736 03/26/20 0714   03/09/20 1000  remdesivir 100 mg in sodium chloride 0.9 % 100 mL IVPB        100 mg 200 mL/hr over 30 Minutes Intravenous Daily 03/08/20 1338 03/12/20 0948   03/08/20 1645   cefTRIAXone (ROCEPHIN) 1 g in sodium chloride 0.9 % 100 mL IVPB        1 g 200 mL/hr over 30 Minutes Intravenous Every 24 hours 03/08/20 1640 03/12/20 1630   03/08/20 1645  azithromycin (ZITHROMAX) 500 mg in sodium chloride 0.9 % 250 mL IVPB        500 mg 250 mL/hr over 60 Minutes Intravenous Every 24 hours 03/08/20 1640 03/13/20 1644   03/08/20 1430  remdesivir 100 mg in sodium chloride 0.9 % 100 mL IVPB       "Followed by" Linked Group Details   100 mg 200 mL/hr over 30 Minutes Intravenous  Once 03/08/20 1338 03/08/20 1508   03/08/20 1400  remdesivir 100 mg in sodium chloride 0.9 % 100 mL IVPB       "Followed by" Linked Group Details   100 mg 200 mL/hr over 30 Minutes Intravenous  Once 03/08/20 1338 03/08/20 1434      Subjective/Interval History:  Patient in bed, appears comfortable, denies any headache, no fever, no chest pain or pressure, +ve shortness of breath , no abdominal pain. No focal weakness.   Assessment/Plan:  Acute Hypoxic Resp. Failure/Pneumonia due to COVID-19  - unfortunately he is unvaccinated and has incurred severe lung parenchymal injury from Covid PNA, being treated with IV Steroids, Remdesivir and Baricitinib, continue supportive care, encouraged to sit up in chair in daytime use I-S and flutter valve for pulmonary toiletry, patient noncompliant with recommendations has been counseled multiple times.  Prone at night if possible.  Still extremely tenuous.  Using as needed Lasix as  needed, dose on 03/21/2020.  Unfortunately patient remains noncompliant with recommendations.  Finished 4 days of Levaquin as well for bacterial bronchitis.   SpO2: (!) 87 % O2 Flow Rate (L/min): 25 L/min FiO2 (%): 50 %   Recent Labs  Lab 03/22/20 0725 03/22/20 0725 03/22/20 1018 03/23/20 0235 03/24/20 0201 03/25/20 0141 03/26/20 0441  WBC 18.7*   < > 17.6* 18.5* 20.4* 20.6* 17.5*  CRP 1.3*  --   --  0.7 0.6 0.5 0.6  DDIMER 0.61*  --   --  0.61* 0.95* 1.02* 0.80*  BNP 50.4   --   --  50.5 53.0 49.2 74.3  PROCALCITON 0.13  --   --  0.15 0.13 <0.10 <0.10  AST 25  --   --  22 47* 55* 40  ALT 42  --   --  39 65* 106* 97*  ALKPHOS 68  --   --  60 65 61 62  BILITOT 0.6  --   --  0.5 0.6 0.4 0.4  ALBUMIN 2.3*  --   --  2.3* 2.3* 2.2* 2.0*   < > = values in this interval not displayed.     Gradual drop in H&H with anemia.  Stools have been intermittently dark, BUN is stable, no GI symptoms, getting 2 units of packed RBC on 03/26/2020, hold further Lovenox, no other source of bleeding, check CT abdomen pelvis, IV PPI, clear liquid diet, GI consult.   High D-dimer.  Due to inflammation, improved after high-dose Lovenox.  Leg ultrasound unremarkable. Now SCDs - 03/26/20 - since H&H dropped.  Hyperkalemia. Resolved after  Lasix, Lokelma and Kayexalate.  Monitor.    Thrombocytosis - Likely reactive due to COVID-19.  Improved.  Check periodically.  Leukocytosis - Secondary to steroids.  Stable.  Transaminitis - Secondary to COVID-19.  Resolved.  Normocytic anemia - Stable.  No evidence of overt bleeding.  Mild diarrhea ongoing from 03/22/2020.  As needed Imodium and gentle hydration, stable TSH.    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 Incidentally noted on CT angiogram. Annual imaging is recommended, follow with vascular surgery post discharge.    Syncope and fall on 03/24/2020 due to orthostatic hypotension.  He had incurred some left leg injury, x-ray of left hip, tibia-fibula negative, no ongoing discomfort, echocardiogram stable, being hydrated with IV fluids, blood pressure improving will monitor.     DVT Prophylaxis: Lovenox >> SCDS Code Status: Full code Family Communication:   Sister Eber Jones updated  [phone] - 03/18/20, 03/24/2020 message left at 11:27 AM. Disposition Plan: Hopefully return home when improved  Status is: Inpatient  Remains inpatient appropriate because:IV treatments appropriate due to  intensity of illness or inability to take PO and Inpatient level of care appropriate due to severity of illness   Dispo: The patient is from: Home              Anticipated d/c is to: Home              Anticipated d/c date is: > 3 days              Patient currently is not medically stable to d/c.    Medications:   Scheduled: . (feeding supplement) PROSource Plus  30 mL Oral BID BM  . sodium chloride   Intravenous Once  . vitamin C  500 mg Oral Daily  . clonazePAM  0.5 mg Oral BID  . famotidine  20 mg Oral Daily  .  feeding supplement  237 mL Oral TID BM  . guaiFENesin  600 mg Oral BID  . mouth rinse  15 mL Mouth Rinse BID  . methylPREDNISolone (SOLU-MEDROL) injection  60 mg Intravenous Daily  . pantoprazole (PROTONIX) IV  40 mg Intravenous Q12H  . zinc sulfate  220 mg Oral Daily   Continuous:  TKP:[id number], albuterol, diphenhydrAMINE, guaiFENesin-dextromethorphan, loperamide, [DISCONTINUED] ondansetron **OR** ondansetron (ZOFRAN) IV, sodium chloride   Objective:  Vital Signs  Vitals:   03/26/20 0500 03/26/20 0700 03/26/20 0900 03/26/20 0919  BP: 113/77 107/73 108/67   Pulse: 93 94 97   Resp: 13 (!) 25 (!) 21   Temp: 97.9 F (36.6 C)  98.2 F (36.8 C)   TempSrc: Axillary  Oral   SpO2: 94% 98% 90% (!) 87%  Weight:      Height:        Intake/Output Summary (Last 24 hours) at 03/26/2020 0948 Last data filed at 03/25/2020 1300 Gross per 24 hour  Intake 260 ml  Output --  Net 260 ml   Filed Weights   03/08/20 1111 03/09/20 1006 03/14/20 0434  Weight: 101.6 kg 101.6 kg 93.1 kg    Exam  Awake Alert, No new F.N deficits, Normal affect [place].AT,PERRAL Supple Neck,No JVD, No cervical lymphadenopathy appriciated.  Symmetrical Chest wall movement, Good air movement bilaterally, CTAB RRR,No Gallops, Rubs or new Murmurs, No Parasternal Heave +ve B.Sounds, Abd Soft, No tenderness, No organomegaly appriciated, No rebound - guarding or rigidity. No Cyanosis,  Clubbing or edema, No new Rash or bruise    Lab Results:  Data Reviewed: I have personally reviewed following labs and imaging studies  CBC: Recent Labs  Lab 03/22/20 0725 03/22/20 0725 03/22/20 1018 03/23/20 0235 03/24/20 0201 03/25/20 0141 03/26/20 0441  WBC 18.7*   < > 17.6* 18.5* 20.4* 20.6* 17.5*  NEUTROABS 17.2*  --   --  17.5* 18.7* 18.3* 14.4*  HGB 10.2*   < > 11.2* 9.4* 8.4* 7.4* 6.4*  HCT 31.4*   < > 33.8* 29.0* 25.3* 23.2* 20.0*  MCV 93.7   < > 93.4 93.9 94.8 96.7 97.1  PLT 405*   < > 288 334 386 386 348   < > = values in this interval not displayed.    Basic Metabolic Panel: Recent Labs  Lab 03/22/20 0725 03/23/20 0235 03/24/20 0201 03/25/20 0141 03/26/20 0441  NA 134* 136 133* 134* 132*  K 4.6 4.2 4.2 4.3 4.4  CL 97* 100 97* 99 100  CO2 25 26 28 28 27   GLUCOSE 190* 189* 194* 194* 163*  BUN 38* 31* 28* 19 16  CREATININE 0.79 0.86 0.84 0.82 0.73  CALCIUM 8.6* 8.4* 8.2* 8.2* 8.0*  MG 2.1 2.1 2.0 2.0 1.9    GFR: Estimated Creatinine Clearance: 104.4 mL/min (by C-G formula based on SCr of 0.73 mg/dL).  Liver Function Tests: Recent Labs  Lab 03/22/20 0725 03/23/20 0235 03/24/20 0201 03/25/20 0141 03/26/20 0441  AST 25 22 47* 55* 40  ALT 42 39 65* 106* 97*  ALKPHOS 68 60 65 61 62  BILITOT 0.6 0.5 0.6 0.4 0.4  PROT 6.5 5.9* 5.6* 5.2* 4.5*  ALBUMIN 2.3* 2.3* 2.3* 2.2* 2.0*   Lab Results  Component Value Date   TSH 0.755 03/23/2020     No results found for this or any previous visit (from the past 240 hour(s)).    Radiology Studies: DG Tibia/Fibula Left Port  Result Date: 03/24/2020 CLINICAL DATA:  fall Fall, left hip pain EXAM:  PORTABLE LEFT TIBIA AND FIBULA - 2 VIEW COMPARISON:  None. FINDINGS: No fracture or bone lesion. Hip and knee joints are normally aligned. 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 Soft tissues are unremarkable. IMPRESSION: No fracture or acute finding Electronically  Signed   By: Amie Portland M.D.   On: 03/24/2020 13:41   DG HIP UNILAT W OR W/O PELVIS MIN 4 VIEWS LEFT  Result Date: 03/24/2020 CLINICAL DATA:  Fall, left hip pain EXAM: DG HIP (WITH OR WITHOUT PELVIS) 4+V LEFT COMPARISON:  None. FINDINGS: No fracture or bone lesion. Hip joints are normally spaced and aligned as are the SI joints and symphysis pubis. Soft tissues are unremarkable. IMPRESSION: Negative. Electronically Signed   By: Amie Portland M.D.   On: 03/24/2020 13:42   ECHOCARDIOGRAM LIMITED  Result Date: 03/24/2020    ECHOCARDIOGRAM REPORT   Patient Name:   Tyler Jackson Date of Exam: 03/24/2020 Medical Rec #:  [id number]     Height:       71.0 in Accession #:    [id number]    Weight:       205.2 lb Date of Birth:  06-04-1951      BSA:          2.132 m Patient Age:    61 years      BP:           127/74 mmHg Patient Gender: M             HR:           99 bpm. Exam Location:  Inpatient Procedure: Limited Echo, Cardiac Doppler, Color Doppler and Intracardiac            Opacification Agent Indications:    CHF-Acute Diastolic  History:        Patient has no prior history of Echocardiogram examinations.                 Risk Factors:Former Smoker. COVID-19.  Sonographer:    Ross Ludwig RDCS (AE) Referring Phys: [id number] [facility]  Sonographer Comments: Technically difficult study due to poor echo windows, suboptimal parasternal window and suboptimal apical window. COVID-19. IMPRESSIONS  1. Left ventricular ejection fraction, by estimation, is 60 to 65%. The left ventricle has normal function. The left ventricle has no regional wall motion abnormalities. There is moderate left ventricular hypertrophy. Left ventricular diastolic parameters are indeterminate.  2. Right ventricular systolic function is normal. The right ventricular size is normal. Tricuspid regurgitation signal is inadequate for assessing PA pressure.  3. The mitral valve is normal in structure. No evidence of mitral valve regurgitation. No  evidence of mitral stenosis.  4. The aortic valve is tricuspid. Aortic valve regurgitation is not visualized. No aortic stenosis is present.  5. The inferior vena cava is normal in size with greater than 50% respiratory variability, suggesting right atrial pressure of 3 mmHg. FINDINGS  Left Ventricle: Left ventricular ejection fraction, by estimation, is 60 to 65%. The left ventricle has normal function. The left ventricle has no regional wall motion abnormalities. Definity contrast agent was given IV to delineate the left ventricular  endocardial borders. The left ventricular internal cavity size was normal in size. There is moderate left ventricular hypertrophy. Left ventricular diastolic parameters are indeterminate. Right Ventricle: The right ventricular size is normal. Right vetricular wall thickness was not well visualized. Right ventricular systolic function is normal. Tricuspid regurgitation signal is inadequate for assessing PA pressure. Left  Atrium: Left atrial size was normal in size. Right Atrium: Right atrial size was normal in size. Pericardium: There is no evidence of pericardial effusion. Mitral Valve: The mitral valve is normal in structure. No evidence of mitral valve regurgitation. No evidence of mitral valve stenosis. Tricuspid Valve: The tricuspid valve is normal in structure. Tricuspid valve regurgitation is trivial. No evidence of tricuspid stenosis. Aortic Valve: The aortic valve is tricuspid. Aortic valve regurgitation is not visualized. No aortic stenosis is present. Pulmonic Valve: The pulmonic valve was not well visualized. Pulmonic valve regurgitation is not visualized. No evidence of pulmonic stenosis. Aorta: The aortic root is normal in size and structure and the ascending aorta was not well visualized. Venous: The inferior vena cava is normal in size with greater than 50% respiratory variability, suggesting right atrial pressure of 3 mmHg. IAS/Shunts: No atrial level shunt detected by  color flow Doppler.  LEFT VENTRICLE PLAX 2D LVIDd:         4.00 cm  Diastology LVIDs:         2.80 cm  LV e' medial:    7.40 cm/s LV PW:         1.50 cm  LV E/e' medial:  7.0 LV IVS:        1.40 cm  LV e' lateral:   7.40 cm/s LVOT diam:     2.30 cm  LV E/e' lateral: 7.0 LVOT Area:     4.15 cm  IVC IVC diam: 1.10 cm LEFT ATRIUM         Index LA diam:    4.40 cm 2.06 cm/m   AORTA Ao Root diam: 3.30 cm Ao Asc diam:  3.90 cm MITRAL VALVE MV Area (PHT): 2.84 cm    SHUNTS MV Decel Time: 267 msec    Systemic Diam: 2.30 cm MV E velocity: 51.90 cm/s MV A velocity: 78.10 cm/s MV E/A ratio:  0.66 Weston BrassGayatri Acharya MD Electronically signed by Weston BrassGayatri Acharya MD Signature Date/Time: 03/24/2020/4:28:07 PM    Final      LOS: 17 days   Susa RaringPrashant Rishabh Rinkenberger  [facility] Pager on [email or web address]  03/26/2020, 9:48 AM

## 2020-03-26 NOTE — Consult Note (Addendum)
[facility] Consult: 11:38 AM 03/26/2020  LOS: 17 days    Referring Provider: Dr Candiss Norse  Primary Care Physician: None does not go to the doctor. Primary Gastroenterologist:  unassigned  Sister Dalbert Batman [phone].    Reason for Consultation:  Anemia, MCV wnl.     HPI: Tyler Jackson is a 69 y.o. male.  PMH unremarkable.  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  No previous upper endoscopy, colonoscopy.  No history of ulcer disease.  No regular medical care.     Currently week 3, approaching week 4 of admission for Covid PNA (positive test 9/29).  Remains on Solumedrol.    Hgb 11.1 to 12.9 in previous 2 to 3 weeks.  But dropped from 9.4 >> 6.4 in last 4 days.  Patelets normal.  No INR.   WBCs 17.5. BUN 16 down from 38 last week, normal creat.   Na 132.   Was getting SQ Lovenox, lst on 10/16 PM and discontinued thereafter.  Daily po Famotidine since 10/1.   Protonix 40 IV bid ordered today, not yet received.    Stable persistent soft BPs, low 100s/60s-70s.  HR in 90s.  Occasional tachypnea.  sats 90s but this AM 87% on 25L HFNC oxygen.   Yesterday PM pt had LOC while staff obtaining standing vitals.  Resolved w supine position.  No reports of melena, hematemesis, CGE.  Patient's active GI symptom are 1) dysphagia, feels like the food has delay and gets stuck on the way down but has never regurgitated.  He cannot tell me at what location this sense of stickiness occurs whether it is the upper or lower esophageal region. 2) anorexia, new since the Covid infection. Patient denies use of NSAIDs, aspirin products at home. Reports of loose stools in recent days but not of melena or hematochezia.    CTAP w contrast after my visit: No evidence for intra-abdominal,  retroperitoneal hematoma.  Normal colon.  Bilateral lower lobe patchy opacities consistent with COVID-19 PNA and/or postinfectious/inflammatory fibrosis  Social: lives in [place].  Does not drink alcohol.      History reviewed. No pertinent past medical history.  History reviewed. No pertinent surgical history.  Prior to Admission medications   Medication Sig Start Date End Date Taking? Authorizing Provider  acetaminophen (TYLENOL) 500 MG tablet Take 500 mg by mouth every 6 (six) hours as needed.   Yes [provider]    Scheduled Meds: . (feeding supplement) PROSource Plus  30 mL Oral BID BM  . sodium chloride   Intravenous Once  . vitamin C  500 mg Oral Daily  . clonazePAM  0.5 mg Oral BID  . famotidine  20 mg Oral Daily  . feeding supplement  237 mL Oral TID BM  . guaiFENesin  600 mg Oral BID  . mouth rinse  15 mL Mouth Rinse BID  . methylPREDNISolone (SOLU-MEDROL) injection  60 mg Intravenous Daily  . pantoprazole (PROTONIX) IV  40 mg Intravenous Q12H  . zinc sulfate  220 mg  Oral Daily   Infusions:  PRN Meds: acetaminophen, albuterol, diphenhydrAMINE, guaiFENesin-dextromethorphan, loperamide, [DISCONTINUED] ondansetron **OR** ondansetron (ZOFRAN) IV, sodium chloride   Allergies as of 03/08/2020  . (No Known Allergies)    History reviewed. No pertinent family history.  Social History   Socioeconomic History  . Marital status: Single    Spouse name: Not on file  . Number of children: Not on file  . Years of education: Not on file  . Highest education level: Not on file  Occupational History  . Not on file  Tobacco Use  . Smoking status: Former Smoker    Quit date: 03/08/1969    Years since quitting: 51.0  . Smokeless tobacco: Never Used  Substance and Sexual Activity  . Alcohol use: Never  . Drug use: Never  . Sexual activity: Not Currently  Other Topics Concern  . Not on file  Social History Narrative  . Not on file   Social Determinants  of Health   Financial Resource Strain:   . Difficulty of Paying Living Expenses: Not on file  Food Insecurity:   . Worried About Charity fundraiser in the Last Year: Not on file  . Ran Out of Food in the Last Year: Not on file  Transportation Needs:   . Lack of Transportation (Medical): Not on file  . Lack of Transportation (Non-Medical): Not on file  Physical Activity:   . Days of Exercise per Week: Not on file  . Minutes of Exercise per Session: Not on file  Stress:   . Feeling of Stress : Not on file  Social Connections:   . Frequency of Communication with Friends and Family: Not on file  . Frequency of Social Gatherings with Friends and Family: Not on file  . Attends Religious Services: Not on file  . Active Member of Clubs or Organizations: Not on file  . Attends Archivist Meetings: Not on file  . Marital Status: Not on file  Intimate Partner Violence:   . Fear of Current or Ex-Partner: Not on file  . Emotionally Abused: Not on file  . Physically Abused: Not on file  . Sexually Abused: Not on file    REVIEW OF SYSTEMS: Constitutional: Feels weak, tired ENT:  No nose bleeds Pulm: Chronically feeling short of breath. CV:  No palpitations, no LE edema.  No angina. GU:  No hematuria, no frequency GI: No black or bloody stools.  No nausea or vomiting.  Anorexia. Heme: Denies excessive or unusual bleeding. Transfusions: 2 PRBCs ordered but phlebotomy just collecting specimen for the type and cross Neuro:  No headaches, no peripheral tingling or numbness Derm:  No itching, no rash or sores.  Endocrine:  No sweats or chills.  No polyuria or dysuria Immunization: Not known but was not vaccinated for COVID-19. Travel:  None beyond local counties in last few months.    PHYSICAL EXAM: Vital signs in last 24 hours: Vitals:   03/26/20 0900 03/26/20 0919  BP: 108/67   Pulse: 97   Resp: (!) 21   Temp: 98.2 F (36.8 C)   SpO2: 90% (!) 87%   Wt Readings from  Last 3 Encounters:  03/14/20 93.1 kg    General: Patient looks ill, tired, weak Head: No facial asymmetry or swelling.  No signs of head trauma. Eyes: Conjunctiva slightly pale. Ears: Not hard of hearing Nose: No congestion or discharge Mouth: Oropharynx slightly dry but clear.  No blood, no lesions.  Tongue midline. Neck: No JVD,  no masses, no thyromegaly Lungs: Diminished sounds but clear.  Slight increased work of breathing at rest. Heart: RRR.  No MRG.  S1-S2 present. Abdomen: Not tender, not distended.  Bowel sounds active.  No HSM, masses, bruits, hernias..   Rectal: Normal rectal tone.  No masses.  Stool is black, will be submitted to lab for FOBT testing.  Did not have reagent at hand to do bedside testing Musc/Skeltl: No joint redness, swelling or gross deformity. Extremities: No CCE. Neurologic: Slightly drowsy but alert.  Oriented x3.  Limited speech.  Moves all 4 limbs.  No tremors or gross weakness. Skin: No sores, no rashes Nodes: No cervical adenopathy Psych: Flat affect.  Cooperative.  Intake/Output from previous day: 10/16 0701 - 10/17 0700 In: 380 [P.O.:380] Out: 300 [Urine:300] Intake/Output this shift: No intake/output data recorded.  LAB RESULTS: Recent Labs    03/24/20 0201 03/25/20 0141 03/26/20 0441  WBC 20.4* 20.6* 17.5*  HGB 8.4* 7.4* 6.4*  HCT 25.3* 23.2* 20.0*  PLT 386 386 348   BMET Lab Results  Component Value Date   NA 132 (L) 03/26/2020   NA 134 (L) 03/25/2020   NA 133 (L) 03/24/2020   K 4.4 03/26/2020   K 4.3 03/25/2020   K 4.2 03/24/2020   CL 100 03/26/2020   CL 99 03/25/2020   CL 97 (L) 03/24/2020   CO2 27 03/26/2020   CO2 28 03/25/2020   CO2 28 03/24/2020   GLUCOSE 163 (H) 03/26/2020   GLUCOSE 194 (H) 03/25/2020   GLUCOSE 194 (H) 03/24/2020   BUN 16 03/26/2020   BUN 19 03/25/2020   BUN 28 (H) 03/24/2020   CREATININE 0.73 03/26/2020   CREATININE 0.82 03/25/2020   CREATININE 0.84 03/24/2020   CALCIUM 8.0 (L) 03/26/2020     CALCIUM 8.2 (L) 03/25/2020   CALCIUM 8.2 (L) 03/24/2020   LFT Recent Labs    03/24/20 0201 03/25/20 0141 03/26/20 0441  PROT 5.6* 5.2* 4.5*  ALBUMIN 2.3* 2.2* 2.0*  AST 47* 55* 40  ALT 65* 106* 97*  ALKPHOS 65 61 62  BILITOT 0.6 0.4 0.4   PT/INR No results found for: INR, PROTIME Hepatitis Panel No results for input(s): HEPBSAG, HCVAB, HEPAIGM, HEPBIGM in the last 72 hours. C-Diff No components found for: CDIFF Lipase  No results found for: LIPASE  Drugs of Abuse  No results found for: LABOPIA, COCAINSCRNUR, LABBENZ, AMPHETMU, THCU, LABBARB   RADIOLOGY STUDIES: CT ABDOMEN PELVIS W CONTRAST  Result Date: 03/26/2020 CLINICAL DATA:  Anemia, lower GI bleed EXAM: CT ABDOMEN AND PELVIS WITH CONTRAST TECHNIQUE: Multidetector CT imaging of the abdomen and pelvis was performed using the standard protocol following bolus administration of intravenous contrast. CONTRAST:  182mL OMNIPAQUE IOHEXOL 300 MG/ML  SOLN COMPARISON:  Partial comparison to CTA chest dated 03/08/2020 FINDINGS: Lower chest: Subpleural patchy opacities in the bilateral lower lobes, compatible with COVID pneumonia and/or post infectious/inflammatory fibrosis. Hepatobiliary: Liver is within normal limits. Gallbladder is unremarkable. No intrahepatic or extrahepatic ductal dilatation. Pancreas: Within normal limits. Spleen: Within normal limits. Adrenals/Urinary Tract: Adrenal glands are within normal limits. 6 mm cyst in the posterior right upper kidney (series 3/image 34). Left kidney is within normal limits. No hydronephrosis. Thick-walled bladder, although underdistended. Stomach/Bowel: Stomach is within normal limits. No evidence of bowel obstruction. Normal appendix (series 3/image 64). No colonic wall thickening, mass, or inflammatory changes in this patient with suspected lower GI bleed. Vascular/Lymphatic: No evidence of abdominal aortic aneurysm. No suspicious abdominopelvic lymphadenopathy. Reproductive: Prostate  is unremarkable. Other: No abdominopelvic ascites. Musculoskeletal: Degenerative changes of the visualized thoracolumbar spine. IMPRESSION: No colonic wall thickening, mass, or inflammatory changes in this patient with suspected lower GI bleed. Subpleural patchy opacities in the bilateral lower lobes, compatible with COVID pneumonia and/or post infectious/inflammatory fibrosis. Electronically Signed   By: Charline Bills M.D.   On: 03/26/2020 11:15   DG Tibia/Fibula Left Port  Result Date: 03/24/2020 CLINICAL DATA:  fall Fall, left hip pain EXAM: PORTABLE LEFT TIBIA AND FIBULA - 2 VIEW COMPARISON:  None. FINDINGS: No fracture or bone lesion. Hip and knee joints are normally aligned. 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 Soft tissues are unremarkable. IMPRESSION: No fracture or acute finding Electronically Signed   By: Amie Portland M.D.   On: 03/24/2020 13:41   DG HIP UNILAT W OR W/O PELVIS MIN 4 VIEWS LEFT  Result Date: 03/24/2020 CLINICAL DATA:  Fall, left hip pain EXAM: DG HIP (WITH OR WITHOUT PELVIS) 4+V LEFT COMPARISON:  None. FINDINGS: No fracture or bone lesion. Hip joints are normally spaced and aligned as are the SI joints and symphysis pubis. Soft tissues are unremarkable. IMPRESSION: Negative. Electronically Signed   By: Amie Portland M.D.   On: 03/24/2020 13:42   ECHOCARDIOGRAM LIMITED  Result Date: 03/24/2020    ECHOCARDIOGRAM REPORT   Patient Name:   HIKEEM ANDERSSON Date of Exam: 03/24/2020 Medical Rec #:  [id number]     Height:       71.0 in Accession #:    [id number]    Weight:       205.2 lb Date of Birth:  1950-07-18      BSA:          2.132 m Patient Age:    59 years      BP:           127/74 mmHg Patient Gender: M             HR:           99 bpm. Exam Location:  Inpatient Procedure: Limited Echo, Cardiac Doppler, Color Doppler and Intracardiac            Opacification Agent Indications:    CHF-Acute Diastolic  History:        Patient has  no prior history of Echocardiogram examinations.                 Risk Factors:Former Smoker. COVID-19.  Sonographer:    Ross Ludwig RDCS (AE) Referring Phys: [id number] [facility]  Sonographer Comments: Technically difficult study due to poor echo windows, suboptimal parasternal window and suboptimal apical window. COVID-19. IMPRESSIONS  1. Left ventricular ejection fraction, by estimation, is 60 to 65%. The left ventricle has normal function. The left ventricle has no regional wall motion abnormalities. There is moderate left ventricular hypertrophy. Left ventricular diastolic parameters are indeterminate.  2. Right ventricular systolic function is normal. The right ventricular size is normal. Tricuspid regurgitation signal is inadequate for assessing PA pressure.  3. The mitral valve is normal in structure. No evidence of mitral valve regurgitation. No evidence of mitral stenosis.  4. The aortic valve is tricuspid. Aortic valve regurgitation is not visualized. No aortic stenosis is present.  5. The inferior vena cava is normal in size with greater than 50% respiratory variability, suggesting right atrial pressure of 3 mmHg. FINDINGS  Left Ventricle: Left ventricular ejection fraction, by estimation, is 60 to 65%. The left ventricle has  normal function. The left ventricle has no regional wall motion abnormalities. Definity contrast agent was given IV to delineate the left ventricular  endocardial borders. The left ventricular internal cavity size was normal in size. There is moderate left ventricular hypertrophy. Left ventricular diastolic parameters are indeterminate. Right Ventricle: The right ventricular size is normal. Right vetricular wall thickness was not well visualized. Right ventricular systolic function is normal. Tricuspid regurgitation signal is inadequate for assessing PA pressure. Left Atrium: Left atrial size was normal in size. Right Atrium: Right atrial size was normal in size. Pericardium: There  is no evidence of pericardial effusion. Mitral Valve: The mitral valve is normal in structure. No evidence of mitral valve regurgitation. No evidence of mitral valve stenosis. Tricuspid Valve: The tricuspid valve is normal in structure. Tricuspid valve regurgitation is trivial. No evidence of tricuspid stenosis. Aortic Valve: The aortic valve is tricuspid. Aortic valve regurgitation is not visualized. No aortic stenosis is present. Pulmonic Valve: The pulmonic valve was not well visualized. Pulmonic valve regurgitation is not visualized. No evidence of pulmonic stenosis. Aorta: The aortic root is normal in size and structure and the ascending aorta was not well visualized. Venous: The inferior vena cava is normal in size with greater than 50% respiratory variability, suggesting right atrial pressure of 3 mmHg. IAS/Shunts: No atrial level shunt detected by color flow Doppler.  LEFT VENTRICLE PLAX 2D LVIDd:         4.00 cm  Diastology LVIDs:         2.80 cm  LV e' medial:    7.40 cm/s LV PW:         1.50 cm  LV E/e' medial:  7.0 LV IVS:        1.40 cm  LV e' lateral:   7.40 cm/s LVOT diam:     2.30 cm  LV E/e' lateral: 7.0 LVOT Area:     4.15 cm  IVC IVC diam: 1.10 cm LEFT ATRIUM         Index LA diam:    4.40 cm 2.06 cm/m   AORTA Ao Root diam: 3.30 cm Ao Asc diam:  3.90 cm MITRAL VALVE MV Area (PHT): 2.84 cm    SHUNTS MV Decel Time: 267 msec    Systemic Diam: 2.30 cm MV E velocity: 51.90 cm/s MV A velocity: 78.10 cm/s MV E/A ratio:  0.66 Cherlynn Kaiser MD Electronically signed by Cherlynn Kaiser MD Signature Date/Time: 03/24/2020/4:28:07 PM    Final       IMPRESSION:   *   General then, in last few days, rapid decline in Hgb.  GI symptoms of anorexia, dysphagia.  Stool is black, looks like an upper GI bleed but FOBT testing pending. No previous EGD, colonoscopy or history of ulcers or anemia. 2 PRBCs ordered.  *    COVID-19 pneumonia.  Not progressing well.  CT chest planned today.   PLAN:     *      Upper endoscopy, timing TBD.  *    Complete the 2 PRBCs that were ordered once blood is typed and crossed.  Follow-up with serial CBCs/Hgbs  *    Initiate and continue the ordered Protonix 40 mg IV every 12 hours.     Azucena Freed  03/26/2020, 11:38 AM Phone [phone]

## 2020-03-27 ENCOUNTER — Encounter (HOSPITAL_COMMUNITY): Payer: Self-pay | Admitting: Certified Registered"

## 2020-03-27 ENCOUNTER — Encounter (HOSPITAL_COMMUNITY)
Admission: EM | Disposition: A | Discharge status: Skilled Nursing Facility | Payer: Self-pay | Source: Other Acute Inpatient Hospital | Attending: Internal Medicine

## 2020-03-27 DIAGNOSIS — K921 Melena: Secondary | ICD-10-CM

## 2020-03-27 DIAGNOSIS — D62 Acute posthemorrhagic anemia: Secondary | ICD-10-CM

## 2020-03-27 LAB — COMPREHENSIVE METABOLIC PANEL
ALT: 84 U/L — ABNORMAL HIGH (ref 0–44)
AST: 31 U/L (ref 15–41)
Albumin: 2 g/dL — ABNORMAL LOW (ref 3.5–5.0)
Alkaline Phosphatase: 59 U/L (ref 38–126)
Anion gap: 8 (ref 5–15)
BUN: 11 mg/dL (ref 8–23)
CO2: 25 mmol/L (ref 22–32)
Calcium: 8 mg/dL — ABNORMAL LOW (ref 8.9–10.3)
Chloride: 101 mmol/L (ref 98–111)
Creatinine, Ser: 0.62 mg/dL (ref 0.61–1.24)
GFR, Estimated: >60 mL/min (ref >60)
Glucose, Bld: 108 mg/dL — ABNORMAL HIGH (ref 70–99)
Potassium: 3.7 mmol/L (ref 3.5–5.1)
Sodium: 134 mmol/L — ABNORMAL LOW (ref 135–145)
Total Bilirubin: 1 mg/dL (ref 0.3–1.2)
Total Protein: 4.6 g/dL — ABNORMAL LOW (ref 6.5–8.1)

## 2020-03-27 LAB — CBC WITH DIFFERENTIAL/PLATELET
Abs Immature Granulocytes: 0.61 10*3/uL — ABNORMAL HIGH (ref 0.00–0.07)
Basophils Absolute: 0 10*3/uL (ref 0.0–0.1)
Basophils Relative: 0 %
Eosinophils Absolute: 0.1 10*3/uL (ref 0.0–0.5)
Eosinophils Relative: 1 %
HCT: 27.6 % — ABNORMAL LOW (ref 39.0–52.0)
Hemoglobin: 9.2 g/dL — ABNORMAL LOW (ref 13.0–17.0)
Immature Granulocytes: 4 %
Lymphocytes Relative: 15 %
Lymphs Abs: 2.3 10*3/uL (ref 0.7–4.0)
MCH: 31.9 pg (ref 26.0–34.0)
MCHC: 33.3 g/dL (ref 30.0–36.0)
MCV: 95.8 fL (ref 80.0–100.0)
Monocytes Absolute: 0.8 10*3/uL (ref 0.1–1.0)
Monocytes Relative: 5 %
Neutro Abs: 11.9 10*3/uL — ABNORMAL HIGH (ref 1.7–7.7)
Neutrophils Relative %: 75 %
Platelets: 328 10*3/uL (ref 150–400)
RBC: 2.88 MIL/uL — ABNORMAL LOW (ref 4.22–5.81)
RDW: 15.9 % — ABNORMAL HIGH (ref 11.5–15.5)
WBC: 15.8 10*3/uL — ABNORMAL HIGH (ref 4.0–10.5)
nRBC: 1.5 % — ABNORMAL HIGH (ref 0.0–0.2)

## 2020-03-27 LAB — BPAM RBC
Blood Product Expiration Date: 202111192359
Blood Product Expiration Date: 202111192359
ISSUE DATE / TIME: 202110171229
ISSUE DATE / TIME: 202110171533
Unit Type and Rh: 5100
Unit Type and Rh: 5100

## 2020-03-27 LAB — TYPE AND SCREEN
ABO/RH(D): O POS
Antibody Screen: NEGATIVE
Unit division: 0
Unit division: 0

## 2020-03-27 LAB — D-DIMER, QUANTITATIVE: D-Dimer, Quant: 1.56 ug/mL-FEU — ABNORMAL HIGH (ref 0.00–0.50)

## 2020-03-27 LAB — MAGNESIUM: Magnesium: 1.9 mg/dL (ref 1.7–2.4)

## 2020-03-27 LAB — BRAIN NATRIURETIC PEPTIDE: B Natriuretic Peptide: 70.6 pg/mL (ref 0.0–100.0)

## 2020-03-27 LAB — C-REACTIVE PROTEIN: CRP: 0.5 mg/dL (ref <1.0)

## 2020-03-27 LAB — PROCALCITONIN: Procalcitonin: <0.1 ng/mL

## 2020-03-27 SURGERY — CANCELLED PROCEDURE

## 2020-03-27 MED ORDER — METHYLPREDNISOLONE SODIUM SUCC 40 MG IJ SOLR
40.0000 mg | Freq: Every day | INTRAMUSCULAR | Status: DC
  Administered 2020-03-27 – 2020-03-29 (×3): 40 mg via INTRAVENOUS
  Filled 2020-03-27 (×3): qty 1

## 2020-03-27 MED ORDER — STERILE WATER FOR INJECTION IJ SOLN
INTRAMUSCULAR | Status: AC
  Administered 2020-03-27: 10 mL
  Filled 2020-03-27: qty 10

## 2020-03-27 SURGICAL SUPPLY — 14 items

## 2020-03-27 NOTE — Progress Notes (Signed)
PROGRESS NOTE  Tyler Jackson IRJ:[id number] DOB: [birth year] DOA: 03/08/2020  PCP: Patient, No Pcp Per  Brief History/Interval Summary: 69 y.o. male with no significant past medical history presented to emergency department with worsening shortness of breath and cough since 1 week. He is not vaccinated against COVID-19.  He was noted to be tachycardic tachypneic and hypoxemic.  Requiring high flow nasal cannula.  COVID-19 test came back positive.  CT angiogram was negative for PE but showed extensive bilateral opacities.  Patient was hospitalized for further management.  Reason for Visit: Acute respiratory failure with hypoxia.  Pneumonia due to COVID-19  Consultants: GI  Procedures:   CT -  TTE - 1. Left ventricular ejection fraction, by estimation, is 60 to 65%. The left ventricle has normal function. The left ventricle has no regional wall motion abnormalities. There is moderate left ventricular hypertrophy. Left ventricular diastolic parameters are indeterminate.  2. Right ventricular systolic function is normal. The right ventricular size is normal. Tricuspid regurgitation signal is inadequate for assessing PA pressure.  3. The mitral valve is normal in structure. No evidence of mitral valve regurgitation. No evidence of mitral stenosis.  4. The aortic valve is tricuspid. Aortic valve regurgitation is not visualized. No aortic stenosis is present.  5. The inferior vena cava is normal in size with greater than 50% respiratory variability, suggesting right atrial pressure of 3 mmHg  Antibiotics: Anti-infectives (From admission, onward)   Start     Dose/Rate Route Frequency Ordered Stop   03/23/20 0830  levofloxacin (LEVAQUIN) tablet 750 mg  Status:  Discontinued        750 mg Oral Daily 03/23/20 0736 03/26/20 0714   03/09/20 1000  remdesivir 100 mg in sodium chloride 0.9 % 100 mL IVPB        100 mg 200 mL/hr over 30 Minutes Intravenous Daily 03/08/20 1338 03/12/20 0948   03/08/20 1645   cefTRIAXone (ROCEPHIN) 1 g in sodium chloride 0.9 % 100 mL IVPB        1 g 200 mL/hr over 30 Minutes Intravenous Every 24 hours 03/08/20 1640 03/12/20 1630   03/08/20 1645  azithromycin (ZITHROMAX) 500 mg in sodium chloride 0.9 % 250 mL IVPB        500 mg 250 mL/hr over 60 Minutes Intravenous Every 24 hours 03/08/20 1640 03/13/20 1644   03/08/20 1430  remdesivir 100 mg in sodium chloride 0.9 % 100 mL IVPB       "Followed by" Linked Group Details   100 mg 200 mL/hr over 30 Minutes Intravenous  Once 03/08/20 1338 03/08/20 1508   03/08/20 1400  remdesivir 100 mg in sodium chloride 0.9 % 100 mL IVPB       "Followed by" Linked Group Details   100 mg 200 mL/hr over 30 Minutes Intravenous  Once 03/08/20 1338 03/08/20 1434      Subjective/Interval History:  Patient in bed overall feels much better, no chest pain or abdominal pain, no nausea vomiting, improved shortness of breath.  No focal weakness.   Assessment/Plan:  Acute Hypoxic Resp. Failure/Pneumonia due to COVID-19  - unfortunately he is unvaccinated and has incurred severe lung parenchymal injury from Covid PNA, being treated with IV Steroids ( taper) , Remdesivir and Baricitinib, continue supportive care.   Encouraged to sit up in chair use I-S and flutter valve for pulmonary toiletry and prone at night but patient is resistant towards that despite multiple sessions of counseling, fortunately he is showing some signs of improvement although  slow and gradual.   SpO2: 99 % O2 Flow Rate (L/min): 25 L/min FiO2 (%): 60 %   Recent Labs  Lab 03/23/20 0235 03/24/20 0201 03/25/20 0141 03/26/20 0441 03/26/20 2003 03/27/20 0339  WBC 18.5* 20.4* 20.6* 17.5*  --  15.8*  CRP 0.7 0.6 0.5 0.6  --  0.5  DDIMER 0.61* 0.95* 1.02* 0.80*  --  1.56*  BNP 50.5 53.0 49.2 74.3  --  70.6  PROCALCITON 0.15 0.13 <0.10 <0.10  --  <0.10  AST 22 47* 55* 40  --  31  ALT 39 65* 106* 97*  --  84*  ALKPHOS 60 65 61 62  --  59  BILITOT 0.5 0.6 0.4 0.4   --  1.0  ALBUMIN 2.3* 2.3* 2.2* 2.0*  --  2.0*  INR  --   --   --   --  1.1  --      Gradual drop in H&H with anemia.  Stools have been intermittently dark, BUN is stable, no GI symptoms, s/p 2 units of packed RBC on 03/26/2020 after which H&H is stable, Lovenox held for now, Hemoccult positive, continue IV PPI, soft liquid diet, GI consult.  For now as he is on heated high flow we are avoiding endoscopy as the risk is higher than benefit.  Monitor H&H closely on IV PPI.  Patient is symptom-free.   High D-dimer.  Due to inflammation, improved after high-dose Lovenox.  Leg ultrasound unremarkable. Now SCDs - 03/26/20- if H&H stays stable over the next few days prophylactic Lovenox can be resumed.  Hyperkalemia. Resolved after  Lasix, Lokelma and Kayexalate.  Monitor.    Thrombocytosis - Likely reactive due to COVID-19.  Improved.  Check periodically.  Leukocytosis - Secondary to steroids.  Stable.  Transaminitis - Secondary to COVID-19.  Resolved.  Mild diarrhea ongoing from 03/22/2020.  As needed Imodium and gentle hydration, stable TSH.    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 Incidentally noted on CT angiogram. Annual imaging is recommended, follow with vascular surgery post discharge.    Syncope and fall on 03/24/2020 due to orthostatic hypotension.  He had incurred some left leg injury, x-ray of left hip, tibia-fibula negative, no ongoing discomfort, echocardiogram stable, blood pressure much better after IV fluids and blood transfusion, gradually advance activity and monitor.     DVT Prophylaxis: Lovenox >> SCDS Code Status: Full code Family Communication:   Sister Eber Jones updated  [phone] - 03/18/20, 03/24/2020 message left at 11:27 AM. Disposition Plan: Hopefully return home when improved  Status is: Inpatient  Remains inpatient appropriate because:IV treatments appropriate due to intensity of illness or inability to take PO  and Inpatient level of care appropriate due to severity of illness   Dispo: The patient is from: Home              Anticipated d/c is to: Home              Anticipated d/c date is: > 3 days              Patient currently is not medically stable to d/c.    Medications:   Scheduled: . (feeding supplement) PROSource Plus  30 mL Oral BID BM  . vitamin C  500 mg Oral Daily  . clonazePAM  0.5 mg Oral BID  . famotidine  20 mg Oral Daily  . feeding supplement  237 mL Oral TID BM  . guaiFENesin  600 mg  Oral BID  . mouth rinse  15 mL Mouth Rinse BID  . methylPREDNISolone (SOLU-MEDROL) injection  40 mg Intravenous Daily  . pantoprazole (PROTONIX) IV  40 mg Intravenous Q12H  . zinc sulfate  220 mg Oral Daily   Continuous:  MVH:QIONGEXBMWUXL, albuterol, guaiFENesin-dextromethorphan, loperamide, [DISCONTINUED] ondansetron **OR** ondansetron (ZOFRAN) IV, sodium chloride   Objective:  Vital Signs  Vitals:   03/27/20 0000 03/27/20 0200 03/27/20 0402 03/27/20 0736  BP: 123/76 123/76 111/73 108/65  Pulse: 88 93 91   Resp: 19 20 (!) 22   Temp: 97.8 F (36.6 C)  98.1 F (36.7 C) 97.9 F (36.6 C)  TempSrc: Axillary  Oral Oral  SpO2: 95% 92% 99%   Weight:      Height:        Intake/Output Summary (Last 24 hours) at 03/27/2020 1106 Last data filed at 03/27/2020 0531 Gross per 24 hour  Intake 937.5 ml  Output 1050 ml  Net -112.5 ml   Filed Weights   03/08/20 1111 03/09/20 1006 03/14/20 0434  Weight: 101.6 kg 101.6 kg 93.1 kg    Exam  Awake Alert, No new F.N deficits, Normal affect [place].AT,PERRAL Supple Neck,No JVD, No cervical lymphadenopathy appriciated.  Symmetrical Chest wall movement, Good air movement bilaterally, CTAB RRR,No Gallops, Rubs or new Murmurs, No Parasternal Heave +ve B.Sounds, Abd Soft, No tenderness, No organomegaly appriciated, No rebound - guarding or rigidity. No Cyanosis, Clubbing or edema, No new Rash or bruise   Lab Results:  Data Reviewed: I  have personally reviewed following labs and imaging studies  CBC: Recent Labs  Lab 03/23/20 0235 03/23/20 0235 03/24/20 0201 03/25/20 0141 03/26/20 0441 03/26/20 2003 03/27/20 0339  WBC 18.5*  --  20.4* 20.6* 17.5*  --  15.8*  NEUTROABS 17.5*  --  18.7* 18.3* 14.4*  --  11.9*  HGB 9.4*   < > 8.4* 7.4* 6.4* 9.8* 9.2*  HCT 29.0*   < > 25.3* 23.2* 20.0* 29.7* 27.6*  MCV 93.9  --  94.8 96.7 97.1  --  95.8  PLT 334  --  386 386 348  --  328   < > = values in this interval not displayed.    Basic Metabolic Panel: Recent Labs  Lab 03/23/20 0235 03/24/20 0201 03/25/20 0141 03/26/20 0441 03/27/20 0339  NA 136 133* 134* 132* 134*  K 4.2 4.2 4.3 4.4 3.7  CL 100 97* 99 100 101  CO2 26 28 28 27 25   GLUCOSE 189* 194* 194* 163* 108*  BUN 31* 28* 19 16 11   CREATININE 0.86 0.84 0.82 0.73 0.62  CALCIUM 8.4* 8.2* 8.2* 8.0* 8.0*  MG 2.1 2.0 2.0 1.9 1.9    GFR: Estimated Creatinine Clearance: 104.4 mL/min (by C-G formula based on SCr of 0.62 mg/dL).  Liver Function Tests: Recent Labs  Lab 03/23/20 0235 03/24/20 0201 03/25/20 0141 03/26/20 0441 03/27/20 0339  AST 22 47* 55* 40 31  ALT 39 65* 106* 97* 84*  ALKPHOS 60 65 61 62 59  BILITOT 0.5 0.6 0.4 0.4 1.0  PROT 5.9* 5.6* 5.2* 4.5* 4.6*  ALBUMIN 2.3* 2.3* 2.2* 2.0* 2.0*   Lab Results  Component Value Date   TSH 0.755 03/23/2020     No results found for this or any previous visit (from the past 240 hour(s)).    Radiology Studies: CT ABDOMEN PELVIS W CONTRAST  Result Date: 03/26/2020 CLINICAL DATA:  Anemia, lower GI bleed EXAM: CT ABDOMEN AND PELVIS WITH CONTRAST TECHNIQUE: Multidetector CT imaging of  the abdomen and pelvis was performed using the standard protocol following bolus administration of intravenous contrast. CONTRAST:  100mL OMNIPAQUE IOHEXOL 300 MG/ML  SOLN COMPARISON:  Partial comparison to CTA chest dated 03/08/2020 FINDINGS: Lower chest: Subpleural patchy opacities in the bilateral lower lobes,  compatible with COVID pneumonia and/or post infectious/inflammatory fibrosis. Hepatobiliary: Liver is within normal limits. Gallbladder is unremarkable. No intrahepatic or extrahepatic ductal dilatation. Pancreas: Within normal limits. Spleen: Within normal limits. Adrenals/Urinary Tract: Adrenal glands are within normal limits. 6 mm cyst in the posterior right upper kidney (series 3/image 34). Left kidney is within normal limits. No hydronephrosis. Thick-walled bladder, although underdistended. Stomach/Bowel: Stomach is within normal limits. No evidence of bowel obstruction. Normal appendix (series 3/image 64). No colonic wall thickening, mass, or inflammatory changes in this patient with suspected lower GI bleed. Vascular/Lymphatic: No evidence of abdominal aortic aneurysm. No suspicious abdominopelvic lymphadenopathy. Reproductive: Prostate is unremarkable. Other: No abdominopelvic ascites. Musculoskeletal: Degenerative changes of the visualized thoracolumbar spine. IMPRESSION: No colonic wall thickening, mass, or inflammatory changes in this patient with suspected lower GI bleed. Subpleural patchy opacities in the bilateral lower lobes, compatible with COVID pneumonia and/or post infectious/inflammatory fibrosis. Electronically Signed   By: Charline BillsSriyesh  Krishnan M.D.   On: 03/26/2020 11:15     LOS: 18 days   Tyler Jackson  [facility] Pager on [email or web address]  03/27/2020, 11:06 AM

## 2020-03-27 NOTE — Progress Notes (Signed)
Occupational Therapy Treatment Patient Details Name: Tyler Jackson MRN: [id number] DOB: 10-03-1950 Today's Date: 03/27/2020    History of present illness Pt is a 69 y.o. male admitted 03/08/20 with worsening SOB and cough; workup for hyoxemia, tested (+) COVID-19. CT angiogram negative for PE, but imaging showed extensive bilateral opacities. No significant PMH.   OT comments  Pt presenting with increased anxiety and fear of mobility impacting his performance in ADLs and functional mobility. Pt reporting stiffness and pain at LLE; initiating PROM and gentle myofascial release for pain relief.  Agreeable to attempt sit at EOB and then sit<>stand with stedy. Taking orthostatics during transitions. Pt requiring Mod A +2 for bed mobility. Mod A +2 for power up into standing with use of stedy. Continue to recommend dc to SNF and will continue to follow acutely as admitted.   BP supine 107/72.  Sitting EOB 117/74.  Sitting on stedy (elevated semi standing) 96/64.  HR 126 max. SpO2 100-89% on 25L HHFNC with FiO2 60%.    Follow Up Recommendations  SNF;Supervision/Assistance - 24 hour    Equipment Recommendations  3 in 1 bedside commode    Recommendations for Other Services      Precautions / Restrictions Precautions Precautions: Fall;Other (comment) Precaution Comments: +syncope/orthostasis; Watch HR       Mobility Bed Mobility Overal bed mobility: Needs Assistance Bed Mobility: Rolling;Sidelying to Sit;Sit to Sidelying Rolling: Modified independent (Device/Increase time) (no rail) Sidelying to sit: Mod assist;+2 for physical assistance     Sit to sidelying: Mod assist;+2 for physical assistance General bed mobility comments: pt reports has not even sat at EOB since syncope/fall 10/15; incr pain left thigh required incr assist with legs and torso  Transfers Overall transfer level: Needs assistance   Transfers: Sit to/from Stand Sit to Stand: Mod assist;+2 physical assistance;+2  safety/equipment         General transfer comment: Mod A +2 to power up into stanidng with use of stedy. Use of bedpad to elevate hips. Requiring Mod A +2 from stedy as pt fatigued and with decreased initation of standing    Balance Overall balance assessment: Needs assistance Sitting-balance support: No upper extremity supported;Feet supported Sitting balance-Leahy Scale: Fair     Standing balance support: Bilateral upper extremity supported Standing balance-Leahy Scale: Poor Standing balance comment: requires bil UE support on stedy with +dizziness/orthostasis                           ADL either performed or assessed with clinical judgement   ADL Overall ADL's : Needs assistance/impaired                         Toilet Transfer: Moderate assistance;+2 for physical assistance;+2 for safety/equipment (sit<>stand with stedy) Toilet Transfer Details (indicate cue type and reason): Simulated in room. Use of stedy due to fear of falling. Mod A +2 for power up.          Functional mobility during ADLs: Moderate assistance;+2 for physical assistance;+2 for safety/equipment (stedy. sit<>stand only) General ADL Comments: Pt presenting with decreased cognition, balance, strength, and activity tolerance     Vision       Perception     Praxis      Cognition Arousal/Alertness: Awake/alert Behavior During Therapy: Anxious Overall Cognitive Status: Impaired/Different from baseline Area of Impairment: Attention;Awareness;Problem solving;Memory;Following commands  Current Attention Level: Sustained Memory: Decreased short-term memory Following Commands: Follows one step commands with increased time;Follows multi-step commands with increased time;Follows multi-step commands inconsistently   Awareness: Intellectual;Emergent Problem Solving: Requires verbal cues General Comments: Pt with inconsistent behaviors (unable to bend leg during  stretches and then bending his leg on his gown when adjusting in bed). Also requiring increased cues throughout. Very particular about how things are done and becomes quickly anxious        Exercises Exercises: General Lower Extremity General Exercises - Lower Extremity Ankle Circles/Pumps: AROM;Both;10 reps Quad Sets: Left;10 reps (5 sec hold) Heel Slides: AAROM;PROM;Left;5 reps;Supine Hip ABduction/ADduction: AAROM;Left;5 reps;Supine Other Exercises Other Exercises: supine hip internal and external rotation x 10 reps AROM    Shoulder Instructions       General Comments BP supine 107/72. sitting EOB 117/74. SItting on stedy (elevated semi standing) 96/64. Dizziness reported upon sitting sitting at EOB. HR 126 max. SpO2 100-89% on 25L HHFNC with FiO2 60%.     Pertinent Vitals/ Pain       Pain Assessment: Faces Faces Pain Scale: Hurts whole lot Pain Location: left inner thigh Pain Descriptors / Indicators: Grimacing;Guarding Pain Intervention(s): Monitored during session;Limited activity within patient's tolerance;Repositioned  Home Living                                          Prior Functioning/Environment              Frequency  Min 2X/week        Progress Toward Goals  OT Goals(current goals can now be found in the care plan section)  Progress towards OT goals: Progressing toward goals  Acute Rehab OT Goals Patient Stated Goal: to get stronger, breathe better OT Goal Formulation: With patient Time For Goal Achievement: 04/03/20 Potential to Achieve Goals: Good ADL Goals Pt Will Perform Grooming: with supervision;standing Pt Will Perform Upper Body Dressing: with modified independence;sitting Pt Will Perform Lower Body Dressing: with supervision;sit to/from stand Pt Will Transfer to Toilet: with supervision;ambulating Pt Will Perform Toileting - Clothing Manipulation and hygiene: with supervision;sit to/from stand Pt/caregiver will  Perform Home Exercise Program: Right Upper extremity;Left upper extremity;With theraband;With written HEP provided;Independently Additional ADL Goal #1: Pt will recall and implement at least 3 ways to conserve energy during ADL routine with no cues  Plan Discharge plan remains appropriate    Co-evaluation    PT/OT/SLP Co-Evaluation/Treatment: Yes Reason for Co-Treatment: For patient/therapist safety;To address functional/ADL transfers (pt anxiety with recent fall)   OT goals addressed during session: ADL's and self-care      AM-PAC OT "6 Clicks" Daily Activity     Outcome Measure   Help from another person eating meals?: A Little Help from another person taking care of personal grooming?: A Little Help from another person toileting, which includes using toliet, bedpan, or urinal?: A Little Help from another person bathing (including washing, rinsing, drying)?: A Lot Help from another person to put on and taking off regular upper body clothing?: A Little Help from another person to put on and taking off regular lower body clothing?: A Lot 6 Click Score: 16    End of Session Equipment Utilized During Treatment: Oxygen  OT Visit Diagnosis: Other abnormalities of gait and mobility (R26.89);Muscle weakness (generalized) (M62.81);Other symptoms and signs involving cognitive function   Activity Tolerance Patient limited by fatigue;Other (comment) (fear of falling)  Patient Left in bed;with call bell/phone within reach   Nurse Communication Mobility status        Time: 0165-5374 OT Time Calculation (min): 52 min  Charges: OT General Charges $OT Visit: 1 Visit OT Treatments $Therapeutic Activity: 8-22 mins  Athaliah Baumbach MSOT, OTR/L Acute Rehab Pager: [phone] Office: [phone]   Theodoro Grist Josiah Nieto 03/27/2020, 5:25 PM

## 2020-03-27 NOTE — Progress Notes (Signed)
Physical Therapy Treatment Patient Details Name: Tyler Jackson MRN: [id number] DOB: 1950-06-21 Today's Date: 03/27/2020    History of Present Illness Pt is a 69 y.o. male admitted 03/08/20 with worsening SOB and cough; workup for hyoxemia, tested (+) COVID-19. CT angiogram negative for PE, but imaging showed extensive bilateral opacities. No significant PMH.    PT Comments    Noted pt with syncopal episode with fall with nursing 03/24/20. Patient reporting left thigh bruise as severe and reports he has been in too much pain and too fearful to even try to sit at EOB since the 15th. After slow, gentle AAROM exercises, pt persuaded to sit at EOB and ultimately stood twice with stedy and 2 person assist. Performed orthostatic BPs (see flowsheet) with +orthostasis--symptomatic dizziness. Discussed his slow progress (even before his fall) and patient in agreement of need for post-acute rehab prior to return home.     Follow Up Recommendations  SNF;Supervision/Assistance - 24 hour     Equipment Recommendations  Other (comment) (TBD at SNF)    Recommendations for Other Services       Precautions / Restrictions Precautions Precautions: Fall;Other (comment) Precaution Comments: +syncope/orthostasis; Watch HR    Mobility  Bed Mobility Overal bed mobility: Needs Assistance Bed Mobility: Rolling;Sidelying to Sit;Sit to Sidelying Rolling: Modified independent (Device/Increase time) (no rail) Sidelying to sit: Mod assist;+2 for physical assistance     Sit to sidelying: Mod assist;+2 for physical assistance General bed mobility comments: pt reports has not even sat at EOB since syncope/fall 10/15; incr pain left thigh required incr assist with legs and torso  Transfers Overall transfer level: Needs assistance   Transfers: Sit to/from Stand Sit to Stand: Mod assist;+2 physical assistance;+2 safety/equipment         General transfer comment: Mod A +2 to power up into stanidng with use of  stedy. Use of bedpad to elevate hips. Requiring Mod A +2 from stedy as pt fatigued and with decreased initation of standing  Ambulation/Gait                 Stairs             Wheelchair Mobility    Modified Rankin (Stroke Patients Only)       Balance Overall balance assessment: Needs assistance Sitting-balance support: No upper extremity supported;Feet supported Sitting balance-Leahy Scale: Fair     Standing balance support: Bilateral upper extremity supported Standing balance-Leahy Scale: Poor Standing balance comment: requires bil UE support on stedy with +dizziness/orthostasis                            Cognition Arousal/Alertness: Awake/alert Behavior During Therapy: Anxious Overall Cognitive Status: Within Functional Limits for tasks assessed                                        Exercises General Exercises - Lower Extremity Ankle Circles/Pumps: AROM;Both;10 reps Quad Sets: Left;10 reps (5 sec hold) Heel Slides: AAROM;PROM;Left;5 reps;Supine Hip ABduction/ADduction: AAROM;Left;5 reps;Supine Other Exercises Other Exercises: supine hip internal and external rotation x 10 reps AROM     General Comments General comments (skin integrity, edema, etc.): BP supine 107/72. sitting EOB 117/74. SItting on stedy (elevated semi standing) 96/64. Dizziness reported upon sitting sitting at EOB. HR 126 max. SpO2 100-89% on 25L HHFNC with FiO2 60%.       Pertinent  Vitals/Pain Pain Assessment: Faces Faces Pain Scale: Hurts whole lot Pain Location: left inner thigh Pain Descriptors / Indicators: Grimacing;Guarding Pain Intervention(s): Limited activity within patient's tolerance;Monitored during session;Repositioned;Relaxation;Utilized relaxation techniques    Home Living                      Prior Function            PT Goals (current goals can now be found in the care plan section) Acute Rehab PT Goals Patient Stated  Goal: to get stronger, breathe better Time For Goal Achievement: 04/02/20 Potential to Achieve Goals: Good Progress towards PT goals: Not progressing toward goals - comment (+orthostasis; Lt thigh pain from fall with nsg)    Frequency    Min 3X/week      PT Plan Discharge plan needs to be updated    Co-evaluation              AM-PAC PT "6 Clicks" Mobility   Outcome Measure  Help needed turning from your back to your side while in a flat bed without using bedrails?: None Help needed moving from lying on your back to sitting on the side of a flat bed without using bedrails?: A Lot Help needed moving to and from a bed to a chair (including a wheelchair)?: A Lot Help needed standing up from a chair using your arms (e.g., wheelchair or bedside chair)?: A Lot Help needed to walk in hospital room?: Total Help needed climbing 3-5 steps with a railing? : Total 6 Click Score: 12    End of Session Equipment Utilized During Treatment: Oxygen Activity Tolerance: Patient limited by pain;Treatment limited secondary to medical complications (Comment) (+orthostasis) Patient left: with call bell/phone within reach;in bed;with SCD's reapplied Nurse Communication: Mobility status PT Visit Diagnosis: Other abnormalities of gait and mobility (R26.89);Muscle weakness (generalized) (M62.81)     Time: 1448-1856 PT Time Calculation (min) (ACUTE ONLY): 70 min  Charges:  $Therapeutic Exercise: 23-37 mins $Therapeutic Activity: 8-22 mins                      Jerolyn Center, PT Pager [phone]    Zena Amos 03/27/2020, 4:51 PM

## 2020-03-27 NOTE — Progress Notes (Addendum)
[facility] Progress Note  Chief Complaint: Acute anemia  History:  Extensive signout received from Dr. Rhea Belton, extensive chart note performed as well.  Patient with prolonged hospitalization for COVID pneumonia, significant hemoglobin drop over short period of time.  He had a reported dark stool last week but not clearly hematochezia or melena.  He has had no BM for the last 2 days by his report, and has also not had food in that period of time.  He was tentatively scheduled for an upper endoscopy today.  He denies abdominal pain, nausea or vomiting.  He has mildly dyspneic at rest, more so with conversation.  It sounds like he has hardly been out of bed even to a chair.  He has a cough without sputum production and denies chest pain.   ROS: Denies dysuria  Objective:   Current Facility-Administered Medications:    (feeding supplement) PROSource Plus liquid 30 mL, 30 mL, Oral, BID BM, Leroy Sea, MD, 30 mL at 03/27/20 0959   acetaminophen (TYLENOL) tablet 650 mg, 650 mg, Oral, Q6H PRN, Pahwani, Rinka R, MD, 650 mg at 03/24/20 1246   albuterol (VENTOLIN HFA) 108 (90 Base) MCG/ACT inhaler 2 puff, 2 puff, Inhalation, Q4H PRN, Charlynne Pander, MD, 2 puff at 03/24/20 2024   ascorbic acid (VITAMIN C) tablet 500 mg, 500 mg, Oral, Daily, Pahwani, Rinka R, MD, 500 mg at 03/27/20 0959   clonazePAM (KLONOPIN) tablet 0.5 mg, 0.5 mg, Oral, BID, Susa Raring K, MD, 0.5 mg at 03/27/20 0959   famotidine (PEPCID) tablet 20 mg, 20 mg, Oral, Daily, Osvaldo Shipper, MD, 20 mg at 03/27/20 0959   feeding supplement (ENSURE ENLIVE / ENSURE PLUS) liquid 237 mL, 237 mL, Oral, TID BM, Leroy Sea, MD, 237 mL at 03/27/20 1003   guaiFENesin (MUCINEX) 12 hr tablet 600 mg, 600 mg, Oral, BID, Osvaldo Shipper, MD, 600 mg at 03/27/20 0958   guaiFENesin-dextromethorphan (ROBITUSSIN DM) 100-10 MG/5ML syrup 5 mL, 5 mL, Oral, Q4H PRN, Osvaldo Shipper, MD, 5 mL at 03/13/20 1610   loperamide  (IMODIUM) capsule 2 mg, 2 mg, Oral, Q6H PRN, Leroy Sea, MD, 2 mg at 03/24/20 0955   MEDLINE mouth rinse, 15 mL, Mouth Rinse, BID, Leroy Sea, MD, 15 mL at 03/27/20 1000   methylPREDNISolone sodium succinate (SOLU-MEDROL) 40 mg/mL injection 40 mg, 40 mg, Intravenous, Daily, Leroy Sea, MD, 40 mg at 03/27/20 0951   [DISCONTINUED] ondansetron (ZOFRAN) tablet 4 mg, 4 mg, Oral, Q6H PRN **OR** ondansetron (ZOFRAN) injection 4 mg, 4 mg, Intravenous, Q6H PRN, Pahwani, Rinka R, MD   pantoprazole (PROTONIX) injection 40 mg, 40 mg, Intravenous, Q12H, Leroy Sea, MD, 40 mg at 03/27/20 0951   sodium chloride (OCEAN) 0.65 % nasal spray 1 spray, 1 spray, Each Nare, PRN, Osvaldo Shipper, MD, 1 spray at 03/20/20 2025   zinc sulfate capsule 220 mg, 220 mg, Oral, Daily, Pahwani, Rinka R, MD, 220 mg at 03/27/20 0959     Vital signs in last 24 hrs: Vitals:   03/27/20 0402 03/27/20 0736  BP: 111/73 108/65  Pulse: 91   Resp: (!) 22   Temp: 98.1 F (36.7 C) 97.9 F (36.6 C)  SpO2: 99%     Intake/Output Summary (Last 24 hours) at 03/27/2020 1017 Last data filed at 03/27/2020 0531 Gross per 24 hour  Intake 937.5 ml  Output 1050 ml  Net -112.5 ml     Physical Exam (I saw him in person in his room on the  respiratory ward) Fatigue, acutely ill but nontoxic.  Increased respiratory rate at rest, not in respiratory distress or using accessory muscles.  He does not engage much in conversation. He is on 32 L/ minute of heated nasal cannula oxygen.  HEENT: sclera anicteric, oral mucosa without lesions  Neck: supple, no thyromegaly, JVD or lymphadenopathy  Cardiac: RRR without murmurs, S1S2 heard, no peripheral edema  Pulm: Inspiratory and expiratory crackles right lung  Abdomen: soft, no tenderness, with active bowel sounds. No guarding or palpable hepatosplenomegaly  Skin; warm and dry, no jaundice  Recent Labs:  CBC Latest Ref Rng & Units 03/27/2020 03/26/2020  03/26/2020  WBC 4.0 - 10.5 K/uL 15.8(H) - 17.5(H)  Hemoglobin 13.0 - 17.0 g/dL 4.7(W) 2.9(F) 6.4(LL)  Hematocrit 39 - 52 % 27.6(L) 29.7(L) 20.0(L)  Platelets 150 - 400 K/uL 328 - 348    Recent Labs  Lab 03/26/20 2003  INR 1.1   CMP Latest Ref Rng & Units 03/27/2020 03/26/2020 03/25/2020  Glucose 70 - 99 mg/dL 621(H) 086(V) 784(O)  BUN 8 - 23 mg/dL 11 16 19   Creatinine 0.61 - 1.24 mg/dL 9.62 9.52  Sodium 135 - 145 mmol/L 134(L) 132(L) 134(L)  Potassium 3.5 - 5.1 mmol/L 3.7 4.4 4.3  Chloride 98 - 111 mmol/L 101 100 99  CO2 22 - 32 mmol/L 25 27 28   Calcium 8.9 - 10.3 mg/dL 8.0(L) 8.0(L) 8.2(L)  Total Protein 6.5 - 8.1 g/dL 4.6(L) 4.5(L) 5.2(L)  Total Bilirubin 0.3 - 1.2 mg/dL 1.0 0.4 0.4  Alkaline Phos 38 - 126 U/L 59 62 61  AST 15 - 41 U/L 31 40 55(H)  ALT 0 - 44 U/L 84(H) 97(H) 106(H)    Assessment & Plan  Assessment: Acute anemia, suspected to possibly have been upper GI bleeding with a reported dark stool.  If this was bleeding, he has not had any in the last couple of days.  His hemoglobin rose with transfusion and has been relatively stable since yesterday.  Given all that, and his tenuous respiratory status, I have elected not to perform the upper endoscopy today due to the risk of respiratory decompensation.  Anesthesia would almost certainly require him to be under general anesthesia for the procedure.  If he has brisk overt bleeding, then that will certainly be necessary.  Otherwise I would prefer to wait and see how he does on twice daily acid suppression.  I have also ordered a regular diet for today and we will take it a day at a time.   35 minutes were spent on this encounter (including chart review, history/exam, counseling/coordination of care, and documentation)  I communicated care plans with the endoscopy department, Dr. 8.41 of [facility] and the patient's nurse.  III Office: [phone]

## 2020-03-28 ENCOUNTER — Ambulatory Visit: Payer: Self-pay

## 2020-03-28 ENCOUNTER — Inpatient Hospital Stay (HOSPITAL_COMMUNITY): Payer: Medicare Other

## 2020-03-28 LAB — COMPREHENSIVE METABOLIC PANEL
ALT: 75 U/L — ABNORMAL HIGH (ref 0–44)
AST: 27 U/L (ref 15–41)
Albumin: 2 g/dL — ABNORMAL LOW (ref 3.5–5.0)
Alkaline Phosphatase: 68 U/L (ref 38–126)
Anion gap: 7 (ref 5–15)
BUN: 10 mg/dL (ref 8–23)
CO2: 26 mmol/L (ref 22–32)
Calcium: 7.8 mg/dL — ABNORMAL LOW (ref 8.9–10.3)
Chloride: 104 mmol/L (ref 98–111)
Creatinine, Ser: 0.63 mg/dL (ref 0.61–1.24)
GFR, Estimated: >60 mL/min (ref >60)
Glucose, Bld: 121 mg/dL — ABNORMAL HIGH (ref 70–99)
Potassium: 3.8 mmol/L (ref 3.5–5.1)
Sodium: 137 mmol/L (ref 135–145)
Total Bilirubin: 0.5 mg/dL (ref 0.3–1.2)
Total Protein: 4.7 g/dL — ABNORMAL LOW (ref 6.5–8.1)

## 2020-03-28 LAB — PROCALCITONIN: Procalcitonin: <0.1 ng/mL

## 2020-03-28 LAB — CBC WITH DIFFERENTIAL/PLATELET
Abs Immature Granulocytes: 0.23 10*3/uL — ABNORMAL HIGH (ref 0.00–0.07)
Basophils Absolute: 0 10*3/uL (ref 0.0–0.1)
Basophils Relative: 0 %
Eosinophils Absolute: 0.2 10*3/uL (ref 0.0–0.5)
Eosinophils Relative: 1 %
HCT: 29.7 % — ABNORMAL LOW (ref 39.0–52.0)
Hemoglobin: 9.8 g/dL — ABNORMAL LOW (ref 13.0–17.0)
Immature Granulocytes: 2 %
Lymphocytes Relative: 10 %
Lymphs Abs: 1.2 10*3/uL (ref 0.7–4.0)
MCH: 32.7 pg (ref 26.0–34.0)
MCHC: 33 g/dL (ref 30.0–36.0)
MCV: 99 fL (ref 80.0–100.0)
Monocytes Absolute: 0.6 10*3/uL (ref 0.1–1.0)
Monocytes Relative: 5 %
Neutro Abs: 10.6 10*3/uL — ABNORMAL HIGH (ref 1.7–7.7)
Neutrophils Relative %: 82 %
Platelets: 317 10*3/uL (ref 150–400)
RBC: 3 MIL/uL — ABNORMAL LOW (ref 4.22–5.81)
RDW: 16.2 % — ABNORMAL HIGH (ref 11.5–15.5)
WBC: 12.9 10*3/uL — ABNORMAL HIGH (ref 4.0–10.5)
nRBC: 0.2 % (ref 0.0–0.2)

## 2020-03-28 LAB — D-DIMER, QUANTITATIVE: D-Dimer, Quant: 1.86 ug/mL-FEU — ABNORMAL HIGH (ref 0.00–0.50)

## 2020-03-28 LAB — C-REACTIVE PROTEIN: CRP: 2.7 mg/dL — ABNORMAL HIGH (ref <1.0)

## 2020-03-28 LAB — BRAIN NATRIURETIC PEPTIDE: B Natriuretic Peptide: 54.5 pg/mL (ref 0.0–100.0)

## 2020-03-28 LAB — MAGNESIUM: Magnesium: 2.1 mg/dL (ref 1.7–2.4)

## 2020-03-28 NOTE — Progress Notes (Signed)
PROGRESS NOTE  Tyler HazyLarry Jackson ZOX:[id number] DOB: 04/04/1951 DOA: 03/08/2020  PCP: Patient, No Pcp Per  Brief History/Interval Summary: 69 y.o. male with no significant past medical history presented to emergency department with worsening shortness of breath and cough since 1 week. He is not vaccinated against COVID-19.  He was noted to be tachycardic tachypneic and hypoxemic.  Requiring high flow nasal cannula.  COVID-19 test came back positive.  CT angiogram was negative for PE but showed extensive bilateral opacities.  Patient was hospitalized for further management.  Reason for Visit: Acute respiratory failure with hypoxia.  Pneumonia due to COVID-19  Consultants: GI  Procedures:   CT -  TTE - 1. Left ventricular ejection fraction, by estimation, is 60 to 65%. The left ventricle has normal function. The left ventricle has no regional wall motion abnormalities. There is moderate left ventricular hypertrophy. Left ventricular diastolic parameters are indeterminate.  2. Right ventricular systolic function is normal. The right ventricular size is normal. Tricuspid regurgitation signal is inadequate for assessing PA pressure.  3. The mitral valve is normal in structure. No evidence of mitral valve regurgitation. No evidence of mitral stenosis.  4. The aortic valve is tricuspid. Aortic valve regurgitation is not visualized. No aortic stenosis is present.  5. The inferior vena cava is normal in size with greater than 50% respiratory variability, suggesting right atrial pressure of 3 mmHg  Antibiotics: Anti-infectives (From admission, onward)   Start     Dose/Rate Route Frequency Ordered Stop   03/23/20 0830  levofloxacin (LEVAQUIN) tablet 750 mg  Status:  Discontinued        750 mg Oral Daily 03/23/20 0736 03/26/20 0714   03/09/20 1000  remdesivir 100 mg in sodium chloride 0.9 % 100 mL IVPB        100 mg 200 mL/hr over 30 Minutes Intravenous Daily 03/08/20 1338 03/12/20 0948   03/08/20 1645   cefTRIAXone (ROCEPHIN) 1 g in sodium chloride 0.9 % 100 mL IVPB        1 g 200 mL/hr over 30 Minutes Intravenous Every 24 hours 03/08/20 1640 03/12/20 1630   03/08/20 1645  azithromycin (ZITHROMAX) 500 mg in sodium chloride 0.9 % 250 mL IVPB        500 mg 250 mL/hr over 60 Minutes Intravenous Every 24 hours 03/08/20 1640 03/13/20 1644   03/08/20 1430  remdesivir 100 mg in sodium chloride 0.9 % 100 mL IVPB       "Followed by" Linked Group Details   100 mg 200 mL/hr over 30 Minutes Intravenous  Once 03/08/20 1338 03/08/20 1508   03/08/20 1400  remdesivir 100 mg in sodium chloride 0.9 % 100 mL IVPB       "Followed by" Linked Group Details   100 mg 200 mL/hr over 30 Minutes Intravenous  Once 03/08/20 1338 03/08/20 1434      Subjective/Interval History:  Patient reports he is feeling much better today, no chest pain, no abdominal pain, no bowel movement overnight .   Assessment/Plan:  Acute Hypoxic Resp. Failure/Pneumonia due to COVID-19   - unfortunately he is unvaccinated and has incurred severe lung parenchymal injury from Covid PNA, being treated with IV Steroids ( taper) , Remdesivir and Baricitinib, continue supportive care.  -Proving oxygen requirement, he is currently on 4 L high flow nasal cannula.  Encouraged to sit up in chair use I-S and flutter valve for pulmonary toiletry and prone at night but patient is resistant towards that despite multiple sessions of counseling,  fortunately he is showing some signs of improvement although slow and gradual.   SpO2: 91 % O2 Flow Rate (L/min): 4 L/min FiO2 (%): 50 %   Recent Labs  Lab 03/24/20 0201 03/25/20 0141 03/26/20 0441 03/26/20 2003 03/27/20 0339 03/28/20 0839  WBC 20.4* 20.6* 17.5*  --  15.8* 12.9*  CRP 0.6 0.5 0.6  --  0.5 2.7*  DDIMER 0.95* 1.02* 0.80*  --  1.56* 1.86*  BNP 53.0 49.2 74.3  --  70.6 54.5  PROCALCITON 0.13 <0.10 <0.10  --  <0.10 <0.10  AST 47* 55* 40  --  31 27  ALT 65* 106* 97*  --  84* 75*    ALKPHOS 65 61 62  --  59 68  BILITOT 0.6 0.4 0.4  --  1.0 0.5  ALBUMIN 2.3* 2.2* 2.0*  --  2.0* 2.0*  INR  --   --   --  1.1  --   --      Acute blood loss anemia see you to GI bleed .  Stools have been intermittently dark, BUN is stable, no GI symptoms, s/p 2 units of packed RBC on 03/26/2020 after which H&H is stable, Lovenox held for now, Hemoccult positive, management per GI, plan for endoscopy when more stable, monitor H&H closely and transfuse as needed, continue with IV PPI.  High D-dimer.  Due to inflammation, improved after high-dose Lovenox.  Leg ultrasound unremarkable. Now SCDs - 03/26/20- if H&H stays stable over the next few days prophylactic Lovenox can be resumed.  Hyperkalemia. Resolved after  Lasix, Lokelma and Kayexalate.  Monitor.    Thrombocytosis - Likely reactive due to COVID-19.  Improved.  Check periodically.  Leukocytosis - Secondary to steroids.  Stable.  Transaminitis - Secondary to COVID-19.  Resolved.  Mild diarrhea ongoing from 03/22/2020.  As needed Imodium and gentle hydration, stable TSH.    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 Incidentally noted on CT angiogram. Annual imaging is recommended, follow with vascular surgery post discharge.    Syncope and fall on 03/24/2020 due to orthostatic hypotension.  He had incurred some left leg injury, x-ray of left hip, tibia-fibula negative, no ongoing discomfort, echocardiogram stable, blood pressure much better after IV fluids and blood transfusion, gradually advance activity and monitor.     DVT Prophylaxis: Lovenox >> SCDS Code Status: Full code Family Communication:   Patient is awake, alert and appropriate, I have discussed at length with the patient Disposition Plan: Hopefully return home when improved  Status is: Inpatient  Remains inpatient appropriate because:IV treatments appropriate due to intensity of illness or inability to take PO and Inpatient  level of care appropriate due to severity of illness   Dispo: The patient is from: Home              Anticipated d/c is to: Home              Anticipated d/c date is: > 3 days              Patient currently is not medically stable to d/c.    Medications:   Scheduled: . (feeding supplement) PROSource Plus  30 mL Oral BID BM  . vitamin C  500 mg Oral Daily  . clonazePAM  0.5 mg Oral BID  . feeding supplement  237 mL Oral TID BM  . guaiFENesin  600 mg Oral BID  . mouth rinse  15 mL Mouth Rinse BID  .  methylPREDNISolone (SOLU-MEDROL) injection  40 mg Intravenous Daily  . pantoprazole (PROTONIX) IV  40 mg Intravenous Q12H  . zinc sulfate  220 mg Oral Daily   Continuous:  OPF:[id number], albuterol, guaiFENesin-dextromethorphan, loperamide, [DISCONTINUED] ondansetron **OR** ondansetron (ZOFRAN) IV, sodium chloride   Objective:  Vital Signs  Vitals:   03/28/20 0902 03/28/20 0906 03/28/20 1007 03/28/20 1223  BP:  112/69  97/60  Pulse: 89 88  (!) 102  Resp: (!) 23 (!) 23  20  Temp:  98.2 F (36.8 C)  98.5 F (36.9 C)  TempSrc:  Oral  Oral  SpO2: 100% 100% 95% 91%  Weight:      Height:        Intake/Output Summary (Last 24 hours) at 03/28/2020 1351 Last data filed at 03/28/2020 1127 Gross per 24 hour  Intake 600 ml  Output 2500 ml  Net -1900 ml   Filed Weights   03/08/20 1111 03/09/20 1006 03/14/20 0434  Weight: 101.6 kg 101.6 kg 93.1 kg    Exam  Awake Alert, Oriented X 3, No new F.N deficits, Normal affect Symmetrical Chest wall movement, Good air movement bilaterally, CTAB RRR,No Gallops,Rubs or new Murmurs, No Parasternal Heave +ve B.Sounds, Abd Soft, No tenderness, No rebound - guarding or rigidity. No Cyanosis, Clubbing or edema, No new Rash or bruise      Lab Results:  Data Reviewed: I have personally reviewed following labs and imaging studies  CBC: Recent Labs  Lab 03/24/20 0201 03/24/20 0201 03/25/20 0141 03/26/20 0441 03/26/20 2003  03/27/20 0339 03/28/20 0839  WBC 20.4*  --  20.6* 17.5*  --  15.8* 12.9*  NEUTROABS 18.7*  --  18.3* 14.4*  --  11.9* 10.6*  HGB 8.4*   < > 7.4* 6.4* 9.8* 9.2* 9.8*  HCT 25.3*   < > 23.2* 20.0* 29.7* 27.6* 29.7*  MCV 94.8  --  96.7 97.1  --  95.8 99.0  PLT 386  --  386 348  --  328 317   < > = values in this interval not displayed.    Basic Metabolic Panel: Recent Labs  Lab 03/24/20 0201 03/25/20 0141 03/26/20 0441 03/27/20 0339 03/28/20 0839  NA 133* 134* 132* 134* 137  K 4.2 4.3 4.4 3.7 3.8  CL 97* 99 100 101 104  CO2 28 28 27 25 26   GLUCOSE 194* 194* 163* 108* 121*  BUN 28* 19 16 11 10   CREATININE 0.84 0.82 0.73 0.62 0.63  CALCIUM 8.2* 8.2* 8.0* 8.0* 7.8*  MG 2.0 2.0 1.9 1.9 2.1    GFR: Estimated Creatinine Clearance: 104.4 mL/min (by C-G formula based on SCr of 0.63 mg/dL).  Liver Function Tests: Recent Labs  Lab 03/24/20 0201 03/25/20 0141 03/26/20 0441 03/27/20 0339 03/28/20 0839  AST 47* 55* 40 31 27  ALT 65* 106* 97* 84* 75*  ALKPHOS 65 61 62 59 68  BILITOT 0.6 0.4 0.4 1.0 0.5  PROT 5.6* 5.2* 4.5* 4.6* 4.7*  ALBUMIN 2.3* 2.2* 2.0* 2.0* 2.0*   Lab Results  Component Value Date   TSH 0.755 03/23/2020     No results found for this or any previous visit (from the past 240 hour(s)).    Radiology Studies: DG Chest Port 1 View  Result Date: 03/28/2020 CLINICAL DATA:  Respiratory failure due to COVID-19. EXAM: PORTABLE CHEST 1 VIEW COMPARISON:  03/23/2020. FINDINGS: Mediastinum and hilar structures stable. Heart size stable. Diffuse bilateral pulmonary infiltrates again noted without interim change. Low lung volumes. No pleural effusion or  pneumothorax. IMPRESSION: Diffuse bilateral pulmonary infiltrates again noted without interim change. Low lung volumes. Electronically Signed   By: Maisie Fus  Register   On: 03/28/2020 05:35     LOS: 19 days   Marifer Hurd  [facility] Pager on [email or web address]  03/28/2020, 1:51 PM

## 2020-03-28 NOTE — Plan of Care (Signed)
  Problem: Education: Goal: Knowledge of General Education information will improve Description: Including pain rating scale, medication(s)/side effects and non-pharmacologic comfort measures Outcome: Progressing   Problem: Health Behavior/Discharge Planning: Goal: Ability to manage health-related needs will improve Outcome: Progressing   Problem: Clinical Measurements: Goal: Ability to maintain clinical measurements within normal limits will improve Outcome: Progressing Goal: Will remain free from infection Outcome: Progressing Goal: Diagnostic test results will improve Outcome: Progressing Goal: Respiratory complications will improve Outcome: Progressing Goal: Cardiovascular complication will be avoided Outcome: Progressing   Problem: Activity: Goal: Risk for activity intolerance will decrease Outcome: Progressing   Problem: Nutrition: Goal: Adequate nutrition will be maintained Outcome: Progressing   Problem: Coping: Goal: Level of anxiety will decrease Outcome: Progressing   Problem: Elimination: Goal: Will not experience complications related to bowel motility Outcome: Progressing Goal: Will not experience complications related to urinary retention Outcome: Progressing   Problem: Safety: Goal: Ability to remain free from injury will improve Outcome: Progressing   Problem: Skin Integrity: Goal: Risk for impaired skin integrity will decrease Outcome: Progressing   Problem: Education: Goal: Knowledge of risk factors and measures for prevention of condition will improve Outcome: Progressing   Problem: Coping: Goal: Psychosocial and spiritual needs will be supported Outcome: Progressing   Problem: Respiratory: Goal: Will maintain a patent airway Outcome: Progressing Goal: Complications related to the disease process, condition or treatment will be avoided or minimized Outcome: Progressing   Problem: Education: Goal: Knowledge of General Education  information will improve Description: Including pain rating scale, medication(s)/side effects and non-pharmacologic comfort measures Outcome: Progressing

## 2020-03-28 NOTE — Progress Notes (Signed)
   03/28/20 1223  Assess: MEWS Score  Temp 98.5 F (36.9 C)  BP 97/60  Pulse Rate (!) 102  ECG Heart Rate (!) 106  Resp 20  SpO2 91 %  Assess: MEWS Score  MEWS Temp 0  MEWS Systolic 1  MEWS Pulse 1  MEWS RR 0  MEWS LOC 0  MEWS Score 2  MEWS Score Color Yellow  Assess: if the MEWS score is Yellow or Red  Were vital signs taken at a resting state? Yes  Focused Assessment No change from prior assessment  Early Detection of Sepsis Score *See Row Information* Low  MEWS guidelines implemented *See Row Information* Yes  Treat  MEWS Interventions Administered scheduled meds/treatments  Pain Scale 0-10  Pain Score 0  Take Vital Signs  Increase Vital Sign Frequency  Yellow: Q 2hr X 2 then Q 4hr X 2, if remains yellow, continue Q 4hrs  Escalate  MEWS: Escalate Yellow: discuss with charge nurse/RN and consider discussing with provider and RRT  Notify: Charge Nurse/RN  Name of Charge Nurse/RN Notified Erick Alley RN  Date Charge Nurse/RN Notified 03/28/20  Time Charge Nurse/RN Notified 1223  Document  Progress note created (see row info) Yes

## 2020-03-28 NOTE — Progress Notes (Signed)
Physical Therapy Treatment Patient Details Name: Tyler Jackson MRN: [id number] DOB: Aug 27, 1950 Today's Date: 03/28/2020    History of Present Illness Pt is a 69 y.o. male admitted 03/08/20 with worsening SOB and cough; workup for hyoxemia, tested (+) COVID-19. CT angiogram negative for PE, but imaging showed extensive bilateral opacities. No significant PMH.     PT Comments      Patient with less LLE pain during session, was not orthostatic during session (however reporting dizziness), and did require incr to 6L [place] O2 during activity to maintain sats >85% (mostly low 90s). HR max 128 bpm. He remains fearful of falling and continued to utilize stedy lift for safety (and 2 person assist). At end of session, he was adamant to return to rt sidelying with HOB 10 degrees. Even on 6L his sats ranged 80-82%. Attempted pursed lip breathing with poor effort by patient. When explained why it is better for him to sit upright in more of a chair position and what his sats were doing, he agreed. End of session in chair position with HOB 55 and sats 95% with RN aware O2 remained at 6L.    03/28/20 1500  Orthostatic Lying   BP- Lying 94/61  Pulse- Lying 118  Orthostatic Sitting  BP- Sitting 99/83  Pulse- Sitting 122  Orthostatic Standing at 0 minutes  BP- Standing at 0 minutes 103/73  Pulse- Standing at 0 minutes 128  Orthostatic Standing at 3 minutes  BP- Standing at 3 minutes  (unable)     Follow Up Recommendations  SNF;Supervision/Assistance - 24 hour     Equipment Recommendations  Other (comment) (TBD at SNF)    Recommendations for Other Services       Precautions / Restrictions Precautions Precautions: Fall;Other (comment) Precaution Comments: +syncope/orthostasis; Watch HR    Mobility  Bed Mobility Overal bed mobility: Needs Assistance Bed Mobility: Rolling;Sidelying to Sit;Sit to Sidelying Rolling: Modified independent (Device/Increase time) (no rail)   Supine to sit: Min  guard;HOB elevated (45; did not return to flat due to orthostasis)   Sit to sidelying: Min assist General bed mobility comments: pt wanting to move his LLE himself, however needed assist to raise it onto the bed  Transfers Overall transfer level: Needs assistance   Transfers: Sit to/from Stand Sit to Stand: Mod assist;+2 physical assistance;+2 safety/equipment         General transfer comment: Mod A +2 to power up into standing with use of stedy. Use of bedpad to elevate hips. Requiring Min guard A from stedy seat  Ambulation/Gait                 Stairs             Wheelchair Mobility    Modified Rankin (Stroke Patients Only)       Balance Overall balance assessment: Needs assistance Sitting-balance support: No upper extremity supported;Feet supported Sitting balance-Leahy Scale: Fair     Standing balance support: Bilateral upper extremity supported Standing balance-Leahy Scale: Poor Standing balance comment: requires bil UE support on stedy with +dizziness/orthostasis                            Cognition Arousal/Alertness: Awake/alert Behavior During Therapy: Anxious (irritable) Overall Cognitive Status: No family/caregiver present to determine baseline cognitive functioning Area of Impairment: Attention;Awareness;Problem solving                   Current Attention Level: Sustained Memory: Decreased short-term  memory Following Commands: Follows one step commands with increased time;Follows multi-step commands with increased time;Follows multi-step commands inconsistently   Awareness: Intellectual;Emergent Problem Solving: Requires verbal cues General Comments: Patient easily irritated by cords, lines, alarms. Insistent on putting O2 tubing over his left shoulder, tried to explain that it needed to be over right shoulder to stand and not pull; once standing he complained that it was pulling (bc over his left shoulder as he wanted).        Exercises General Exercises - Lower Extremity Ankle Circles/Pumps: AROM;Left;5 reps Quad Sets: Left;5 reps (5 sec hold) Heel Slides: Left;5 reps;Supine;AROM    General Comments General comments (skin integrity, edema, etc.): Wants what he wants when he wants it. Ultimately grateful for PT session and asking for daily PT "I don't know how I'm going to get better unless I do this every day"      Pertinent Vitals/Pain Pain Assessment: Faces Faces Pain Scale: Hurts little more Pain Location: left inner thigh Pain Descriptors / Indicators: Guarding;Sore Pain Intervention(s): Limited activity within patient's tolerance;Monitored during session;Repositioned    Home Living                      Prior Function            PT Goals (current goals can now be found in the care plan section) Acute Rehab PT Goals Patient Stated Goal: to get stronger, breathe better Time For Goal Achievement: 04/02/20 Potential to Achieve Goals: Good Progress towards PT goals: Progressing toward goals    Frequency    Min 3X/week      PT Plan Current plan remains appropriate    Co-evaluation              AM-PAC PT "6 Clicks" Mobility   Outcome Measure  Help needed turning from your back to your side while in a flat bed without using bedrails?: None Help needed moving from lying on your back to sitting on the side of a flat bed without using bedrails?: A Little Help needed moving to and from a bed to a chair (including a wheelchair)?: A Lot Help needed standing up from a chair using your arms (e.g., wheelchair or bedside chair)?: A Lot Help needed to walk in hospital room?: Total Help needed climbing 3-5 steps with a railing? : Total 6 Click Score: 13    End of Session Equipment Utilized During Treatment: Oxygen Activity Tolerance: Patient limited by pain;Treatment limited secondary to medical complications (Comment) (decr sats) Patient left: with call bell/phone within  reach;in bed (pt refused SCD's) Nurse Communication: Mobility status;Other (comment) (O2 incr to 6L during PT) PT Visit Diagnosis: Other abnormalities of gait and mobility (R26.89);Muscle weakness (generalized) (M62.81)     Time: 6222-9798 PT Time Calculation (min) (ACUTE ONLY): 33 min  Charges:  $Therapeutic Activity: 23-37 mins                      Jerolyn Center, PT Pager [phone]    Zena Amos 03/28/2020, 4:47 PM

## 2020-03-28 NOTE — Progress Notes (Signed)
[facility] Progress Note  Chief Complaint: Acute blood loss anemia,?  Melena last week  History:  Tyler Jackson has not had any overt bleeding for least 48 hours, has had no BM during that time either.  He is very hungry and would like to know when he can eat again.  He refused blood work earlier this morning but was agreeable to it later on.  ROS: Cardiovascular: Denies chest pain Respiratory: Intermittent cough, dyspnea is improving.  He is already been out of bed. Urinary: Denies dysuria  Objective:   Current Facility-Administered Medications:    (feeding supplement) PROSource Plus liquid 30 mL, 30 mL, Oral, BID BM, Leroy Sea, MD, 30 mL at 03/27/20 0959   acetaminophen (TYLENOL) tablet 650 mg, 650 mg, Oral, Q6H PRN, Pahwani, Rinka R, MD, 650 mg at 03/24/20 1246   albuterol (VENTOLIN HFA) 108 (90 Base) MCG/ACT inhaler 2 puff, 2 puff, Inhalation, Q4H PRN, Charlynne Pander, MD, 2 puff at 03/24/20 2024   ascorbic acid (VITAMIN C) tablet 500 mg, 500 mg, Oral, Daily, Pahwani, Rinka R, MD, 500 mg at 03/27/20 0959   clonazePAM (KLONOPIN) tablet 0.5 mg, 0.5 mg, Oral, BID, Susa Raring K, MD, 0.5 mg at 03/27/20 2205   feeding supplement (ENSURE ENLIVE / ENSURE PLUS) liquid 237 mL, 237 mL, Oral, TID BM, Susa Raring K, MD, 237 mL at 03/27/20 1003   guaiFENesin (MUCINEX) 12 hr tablet 600 mg, 600 mg, Oral, BID, Osvaldo Shipper, MD, 600 mg at 03/27/20 2205   guaiFENesin-dextromethorphan (ROBITUSSIN DM) 100-10 MG/5ML syrup 5 mL, 5 mL, Oral, Q4H PRN, Osvaldo Shipper, MD, 5 mL at 03/13/20 7026   loperamide (IMODIUM) capsule 2 mg, 2 mg, Oral, Q6H PRN, Leroy Sea, MD, 2 mg at 03/24/20 0955   MEDLINE mouth rinse, 15 mL, Mouth Rinse, BID, Leroy Sea, MD, 15 mL at 03/28/20 0845   methylPREDNISolone sodium succinate (SOLU-MEDROL) 40 mg/mL injection 40 mg, 40 mg, Intravenous, Daily, Leroy Sea, MD, 40 mg at 03/28/20 0844   [DISCONTINUED] ondansetron (ZOFRAN) tablet 4  mg, 4 mg, Oral, Q6H PRN **OR** ondansetron (ZOFRAN) injection 4 mg, 4 mg, Intravenous, Q6H PRN, Pahwani, Rinka R, MD   pantoprazole (PROTONIX) injection 40 mg, 40 mg, Intravenous, Q12H, Leroy Sea, MD, 40 mg at 03/28/20 0845   sodium chloride (OCEAN) 0.65 % nasal spray 1 spray, 1 spray, Each Nare, PRN, Osvaldo Shipper, MD, 1 spray at 03/20/20 2025   zinc sulfate capsule 220 mg, 220 mg, Oral, Daily, Pahwani, Rinka R, MD, 220 mg at 03/27/20 0959     Vital signs in last 24 hrs: Vitals:   03/28/20 1007 03/28/20 1223  BP:  97/60  Pulse:  (!) 102  Resp:  20  Temp:  98.5 F (36.9 C)  SpO2: 95% 91%    Intake/Output Summary (Last 24 hours) at 03/28/2020 1424 Last data filed at 03/28/2020 1127 Gross per 24 hour  Intake 600 ml  Output 2500 ml  Net -1900 ml     Physical Exam Saw this patient in his COVID isolation room. His breathing certainly looks more comfortable than yesterday.  He is now down to 6 L of nasal cannula oxygen and can hold a conversation without getting dyspneic.  HEENT: sclera anicteric, oral mucosa without lesions  Neck: supple, no thyromegaly, JVD or lymphadenopathy  Cardiac: RRR without murmurs, S1S2 heard, no peripheral edema  Pulm: Inspiratory crackles, more so left face, normal RR and effort noted  Abdomen: soft, no tenderness, with active bowel  sounds. No guarding or palpable hepatosplenomegaly  Skin; warm and dry, no jaundice  Recent Labs:  CBC Latest Ref Rng & Units 03/28/2020 03/27/2020 03/26/2020  WBC 4.0 - 10.5 K/uL 12.9(H) 15.8(H) -  Hemoglobin 13.0 - 17.0 g/dL 7.3(A) 1.9(F) 7.9(K)  Hematocrit 39 - 52 % 29.7(L) 27.6(L) 29.7(L)  Platelets 150 - 400 K/uL 317 328 -    Recent Labs  Lab 03/26/20 2003  INR 1.1   CMP Latest Ref Rng & Units 03/28/2020 03/27/2020 03/26/2020  Glucose 70 - 99 mg/dL 240(X) 735(H) 299(M)  BUN 8 - 23 mg/dL 10 11 16   Creatinine 0.61 - 1.24 mg/dL 4.26 8.34  Sodium 135 - 145 mmol/L 137 134(L) 132(L)   Potassium 3.5 - 5.1 mmol/L 3.8 3.7 4.4  Chloride 98 - 111 mmol/L 104 101 100  CO2 22 - 32 mmol/L 26 25 27   Calcium 8.9 - 10.3 mg/dL 7.8(L) 8.0(L) 8.0(L)  Total Protein 6.5 - 8.1 g/dL 4.7(L) 4.6(L) 4.5(L)  Total Bilirubin 0.3 - 1.2 mg/dL 0.5 1.0 0.4  Alkaline Phos 38 - 126 U/L 68 59 62  AST 15 - 41 U/L 27 31 40  ALT 0 - 44 U/L 75(H) 84(H) 97(H)     Assessment & Plan  Assessment: Acute blood loss anemia, there was concern that he had melena indicating upper GI bleed late last week and over the weekend.  He was put on PPI with initial plans for EGD, but procedure deferred yesterday because of his respiratory status.  That status is improved, and fortunately it appears that his bleeding has stopped with stable hemoglobin and no more passage of black stool.  I think further improvement in his respiratory status would be best before we sedate him for upper endoscopy.  I also discussed the case with his [facility] hospitalist and the anesthesia service.  Regular diet ordered.  Probable EGD prior to discharge, timing TBD   25 minutes were spent on this encounter (including chart review, history/exam, counseling/coordination of care, and documentation)    1.96 III Office: [phone]

## 2020-03-28 NOTE — NC FL2 (Signed)
[place] MEDICAID FL2 LEVEL OF CARE SCREENING TOOL     IDENTIFICATION  Patient Name: Tyler Jackson Birthdate: 01-05-1951 Sex: male Admission Date (Current Location): 03/08/2020  [facility] and [place] Number:  Producer, television/film/video and Address:  [facility] [place], [address], [place], [place] [address]      Provider Number: [id number]  Attending Physician Name and Address:  Starleen Arms, MD  Relative Name and Phone Number:  Eber Jones sister, [phone]    Current Level of Care: Hospital Recommended Level of Care: Skilled Nursing Facility Prior Approval Number:    Date Approved/Denied:   PASRR Number: [id number]  Discharge Plan: SNF    Current Diagnoses: Patient Active Problem List   Diagnosis Date Noted  . Thrombocytosis 03/09/2020  . Elevated liver enzymes 03/09/2020  . Acute hypoxemic respiratory failure due to COVID-19 (HCC) 03/08/2020    Orientation RESPIRATION BLADDER Height & Weight     Self, Time, Situation, Place  O2 (Nasal cannula 6L) Continent Weight: 205 lb 4 oz (93.1 kg) Height:  5\' 11"  (180.3 cm)  BEHAVIORAL SYMPTOMS/MOOD NEUROLOGICAL BOWEL NUTRITION STATUS      Continent Diet (Please see DC Summary)  AMBULATORY STATUS COMMUNICATION OF NEEDS Skin   Extensive Assist Verbally Normal                       Personal Care Assistance Level of Assistance  Bathing, Feeding, Dressing Bathing Assistance: Limited assistance Feeding assistance: Independent Dressing Assistance: Limited assistance     Functional Limitations Info             SPECIAL CARE FACTORS FREQUENCY  PT (By licensed PT), OT (By licensed OT)     PT Frequency: 3x/week OT Frequency: 2x/week            Contractures Contractures Info: Not present    Additional Factors Info  Code Status, Allergies, Isolation Precautions, Psychotropic Code Status Info: Full Allergies Info: NKA Psychotropic Info: Klonopin   Isolation Precautions Info: COVID +  on 03/08/20, will be off isolation on 03/30/20     Current Medications (03/28/2020):  This is the current hospital active medication list Current Facility-Administered Medications  Medication Dose Route Frequency Provider Last Rate Last Admin  . (feeding supplement) PROSource Plus liquid 30 mL  30 mL Oral BID BM 03/30/2020, MD   30 mL at 03/27/20 0959  . acetaminophen (TYLENOL) tablet 650 mg  650 mg Oral Q6H PRN Pahwani, Rinka R, MD   650 mg at 03/24/20 1246  . albuterol (VENTOLIN HFA) 108 (90 Base) MCG/ACT inhaler 2 puff  2 puff Inhalation Q4H PRN 03/26/20, MD   2 puff at 03/24/20 2024  . ascorbic acid (VITAMIN C) tablet 500 mg  500 mg Oral Daily Pahwani, Rinka R, MD   500 mg at 03/27/20 0959  . clonazePAM (KLONOPIN) tablet 0.5 mg  0.5 mg Oral BID 03/29/20, MD   0.5 mg at 03/27/20 2205  . feeding supplement (ENSURE ENLIVE / ENSURE PLUS) liquid 237 mL  237 mL Oral TID BM 2206, MD   237 mL at 03/27/20 1003  . guaiFENesin (MUCINEX) 12 hr tablet 600 mg  600 mg Oral BID 03/29/20, MD   600 mg at 03/27/20 2205  . guaiFENesin-dextromethorphan (ROBITUSSIN DM) 100-10 MG/5ML syrup 5 mL  5 mL Oral Q4H PRN 2206, MD   5 mL at 03/13/20 0333  . loperamide (IMODIUM) capsule 2 mg  2 mg Oral Q6H PRN Leroy Sea, MD   2 mg at 03/24/20 0955  . MEDLINE mouth rinse  15 mL Mouth Rinse BID Leroy Sea, MD   15 mL at 03/28/20 0845  . methylPREDNISolone sodium succinate (SOLU-MEDROL) 40 mg/mL injection 40 mg  40 mg Intravenous Daily Leroy Sea, MD   40 mg at 03/28/20 0844  . ondansetron (ZOFRAN) injection 4 mg  4 mg Intravenous Q6H PRN Pahwani, Rinka R, MD      . pantoprazole (PROTONIX) injection 40 mg  40 mg Intravenous Q12H Leroy Sea, MD   40 mg at 03/28/20 0845  . sodium chloride (OCEAN) 0.65 % nasal spray 1 spray  1 spray Each Nare PRN Osvaldo Shipper, MD   1 spray at 03/20/20 2025  . zinc sulfate capsule 220 mg  220 mg Oral Daily  Pahwani, Rinka R, MD   220 mg at 03/27/20 7078     Discharge Medications: Please see discharge summary for a list of discharge medications.  Relevant Imaging Results:  Relevant Lab Results:   Additional Information SSN: [id number] [employer], [place]

## 2020-03-29 DIAGNOSIS — U071 COVID-19: Principal | ICD-10-CM

## 2020-03-29 DIAGNOSIS — J9601 Acute respiratory failure with hypoxia: Secondary | ICD-10-CM

## 2020-03-29 LAB — COMPREHENSIVE METABOLIC PANEL
ALT: 64 U/L — ABNORMAL HIGH (ref 0–44)
AST: 21 U/L (ref 15–41)
Albumin: 2 g/dL — ABNORMAL LOW (ref 3.5–5.0)
Alkaline Phosphatase: 65 U/L (ref 38–126)
Anion gap: 7 (ref 5–15)
BUN: 10 mg/dL (ref 8–23)
CO2: 25 mmol/L (ref 22–32)
Calcium: 7.6 mg/dL — ABNORMAL LOW (ref 8.9–10.3)
Chloride: 103 mmol/L (ref 98–111)
Creatinine, Ser: 0.66 mg/dL (ref 0.61–1.24)
GFR, Estimated: >60 mL/min (ref >60)
Glucose, Bld: 143 mg/dL — ABNORMAL HIGH (ref 70–99)
Potassium: 3.6 mmol/L (ref 3.5–5.1)
Sodium: 135 mmol/L (ref 135–145)
Total Bilirubin: 0.4 mg/dL (ref 0.3–1.2)
Total Protein: 4.5 g/dL — ABNORMAL LOW (ref 6.5–8.1)

## 2020-03-29 LAB — CBC
HCT: 30.2 % — ABNORMAL LOW (ref 39.0–52.0)
Hemoglobin: 9.8 g/dL — ABNORMAL LOW (ref 13.0–17.0)
MCH: 32.3 pg (ref 26.0–34.0)
MCHC: 32.5 g/dL (ref 30.0–36.0)
MCV: 99.7 fL (ref 80.0–100.0)
Platelets: 318 10*3/uL (ref 150–400)
RBC: 3.03 MIL/uL — ABNORMAL LOW (ref 4.22–5.81)
RDW: 16.6 % — ABNORMAL HIGH (ref 11.5–15.5)
WBC: 13.1 10*3/uL — ABNORMAL HIGH (ref 4.0–10.5)
nRBC: 0.2 % (ref 0.0–0.2)

## 2020-03-29 LAB — D-DIMER, QUANTITATIVE: D-Dimer, Quant: 1.87 ug/mL-FEU — ABNORMAL HIGH (ref 0.00–0.50)

## 2020-03-29 MED ORDER — ENOXAPARIN SODIUM 40 MG/0.4ML ~~LOC~~ SOLN
40.0000 mg | SUBCUTANEOUS | Status: DC
  Administered 2020-03-29 – 2020-04-02 (×5): 40 mg via SUBCUTANEOUS
  Filled 2020-03-29 (×5): qty 0.4

## 2020-03-29 MED ORDER — PANTOPRAZOLE SODIUM 40 MG PO TBEC
40.0000 mg | DELAYED_RELEASE_TABLET | Freq: Two times a day (BID) | ORAL | Status: DC
  Administered 2020-03-29 – 2020-04-02 (×8): 40 mg via ORAL
  Filled 2020-03-29 (×8): qty 1

## 2020-03-29 MED ORDER — FUROSEMIDE 10 MG/ML IJ SOLN
40.0000 mg | Freq: Once | INTRAMUSCULAR | Status: AC
  Administered 2020-03-29: 40 mg via INTRAVENOUS
  Filled 2020-03-29: qty 4

## 2020-03-29 NOTE — Progress Notes (Signed)
PROGRESS NOTE  Tyler Jackson SWF:[id number] DOB: 09-22-1950 DOA: 03/08/2020  PCP: Patient, No Pcp Per  Brief History/Interval Summary: 69 y.o. male with no significant past medical history presented to emergency department with worsening shortness of breath and cough since 1 week. He is not vaccinated against COVID-19.  He was noted to be tachycardic tachypneic and hypoxemic.  Requiring high flow nasal cannula.  COVID-19 test came back positive.  CT angiogram was negative for PE but showed extensive bilateral opacities.  Patient was hospitalized for further management.  Reason for Visit: Acute respiratory failure with hypoxia.  Pneumonia due to COVID-19  Consultants: GI  Procedures:   CT -  TTE - 1. Left ventricular ejection fraction, by estimation, is 60 to 65%. The left ventricle has normal function. The left ventricle has no regional wall motion abnormalities. There is moderate left ventricular hypertrophy. Left ventricular diastolic parameters are indeterminate.  2. Right ventricular systolic function is normal. The right ventricular size is normal. Tricuspid regurgitation signal is inadequate for assessing PA pressure.  3. The mitral valve is normal in structure. No evidence of mitral valve regurgitation. No evidence of mitral stenosis.  4. The aortic valve is tricuspid. Aortic valve regurgitation is not visualized. No aortic stenosis is present.  5. The inferior vena cava is normal in size with greater than 50% respiratory variability, suggesting right atrial pressure of 3 mmHg  Antibiotics: Anti-infectives (From admission, onward)   Start     Dose/Rate Route Frequency Ordered Stop   03/23/20 0830  levofloxacin (LEVAQUIN) tablet 750 mg  Status:  Discontinued        750 mg Oral Daily 03/23/20 0736 03/26/20 0714   03/09/20 1000  remdesivir 100 mg in sodium chloride 0.9 % 100 mL IVPB        100 mg 200 mL/hr over 30 Minutes Intravenous Daily 03/08/20 1338 03/12/20 0948   03/08/20 1645   cefTRIAXone (ROCEPHIN) 1 g in sodium chloride 0.9 % 100 mL IVPB        1 g 200 mL/hr over 30 Minutes Intravenous Every 24 hours 03/08/20 1640 03/12/20 1630   03/08/20 1645  azithromycin (ZITHROMAX) 500 mg in sodium chloride 0.9 % 250 mL IVPB        500 mg 250 mL/hr over 60 Minutes Intravenous Every 24 hours 03/08/20 1640 03/13/20 1644   03/08/20 1430  remdesivir 100 mg in sodium chloride 0.9 % 100 mL IVPB       "Followed by" Linked Group Details   100 mg 200 mL/hr over 30 Minutes Intravenous  Once 03/08/20 1338 03/08/20 1508   03/08/20 1400  remdesivir 100 mg in sodium chloride 0.9 % 100 mL IVPB       "Followed by" Linked Group Details   100 mg 200 mL/hr over 30 Minutes Intravenous  Once 03/08/20 1338 03/08/20 1434      Subjective/Interval history :  Patient with increased oxygen requirement overnight, up to 10 L high flow nasal cannula, he denies any further melena, coffee-ground emesis or abdominal pain .  Assessment/Plan:  Acute Hypoxic Resp. Failure/Pneumonia due to COVID-19   - unfortunately he is unvaccinated and has incurred severe lung parenchymal injury from Covid PNA. -He was treated for total of 14 days of baricitinib . -Treated with IV steroids, has been tapered, will give last dose today 10/20 . -Did with remdesivir . -He is with worsening oxygen requirement today, up to 10 L via high flow nasal cannula .  Encouraged to sit up in  chair use I-S and flutter valve for pulmonary toiletry and prone at night but patient is resistant towards that despite multiple sessions of counseling, fortunately he is showing some signs of improvement although slow and gradual.   SpO2: 98 % O2 Flow Rate (L/min): 10 L/min FiO2 (%): 50 %   Recent Labs  Lab 03/24/20 0201 03/24/20 0201 03/25/20 0141 03/26/20 0441 03/26/20 2003 03/27/20 0339 03/28/20 0839 03/29/20 0135  WBC 20.4*   < > 20.6* 17.5*  --  15.8* 12.9* 13.1*  CRP 0.6  --  0.5 0.6  --  0.5 2.7*  --   DDIMER 0.95*   < >  1.02* 0.80*  --  1.56* 1.86* 1.87*  BNP 53.0  --  49.2 74.3  --  70.6 54.5  --   PROCALCITON 0.13  --  <0.10 <0.10  --  <0.10 <0.10  --   AST 47*   < > 55* 40  --  31 27 21   ALT 65*   < > 106* 97*  --  84* 75* 64*  ALKPHOS 65   < > 61 62  --  59 68 65  BILITOT 0.6   < > 0.4 0.4  --  1.0 0.5 0.4  ALBUMIN 2.3*   < > 2.2* 2.0*  --  2.0* 2.0* 2.0*  INR  --   --   --   --  1.1  --   --   --    < > = values in this interval not displayed.     Acute blood loss anemia Due to GI bleed -Stools have been intermittently dark, BUN is stable, no GI symptoms, s/p 2 units of packed RBC on 03/26/2020 after which H&H is stable, Hemoccult positive. -GI consult greatly appreciated, hemoglobin have been stable over the last 72 hours, with no evidence of recurrent GI bleed, holding on endoscopy right now given tenuous respiratory status, and stable hemoglobin, will defer endoscopy for later when patient is more clinically stable, or if is having signs of unstable GI bleed. -Patient resumed back on DVT prophylaxis dose of Lovenox given high risk for VTE.  High D-dimer.   - Due to inflammation, improved after high-dose Lovenox.  Now Lovenox has been held due to GI bleed. -DVT prophylaxis dose Lovenox 10/20.  Hyperkalemia. Resolved after  Lasix, Lokelma and Kayexalate.  Monitor.    Thrombocytosis - Likely reactive due to COVID-19.  Improved.  Check periodically.  Leukocytosis - Secondary to steroids.  Stable.  Transaminitis - Secondary to COVID-19.  Resolved.  Mild diarrhea ongoing from 03/22/2020.  As needed Imodium and gentle hydration, stable TSH.    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 Incidentally noted on CT angiogram. Annual imaging is recommended, follow with vascular surgery post discharge.    Syncope and fall on 03/24/2020 due to orthostatic hypotension.  He had incurred some left leg injury, x-ray of left hip, tibia-fibula negative, no ongoing  discomfort, echocardiogram stable, blood pressure much better after IV fluids and blood transfusion, gradually advance activity and monitor.     DVT Prophylaxis: Lovenox >> SCDS Code Status: Full code Family Communication:   Patient is awake, alert and appropriate, I have discussed at length with the patient Disposition Plan: Hopefully return home when improved  Status is: Inpatient  Remains inpatient appropriate because:IV treatments appropriate due to intensity of illness or inability to take PO and Inpatient level of care appropriate due to severity of illness  Dispo: The patient is from: Home              Anticipated d/c is to: SNF              Anticipated d/c date is: > 3 days              Patient currently is not medically stable to d/c.    Medications:   Scheduled: . (feeding supplement) PROSource Plus  30 mL Oral BID BM  . vitamin C  500 mg Oral Daily  . clonazePAM  0.5 mg Oral BID  . enoxaparin (LOVENOX) injection  40 mg Subcutaneous Q24H  . feeding supplement  237 mL Oral TID BM  . guaiFENesin  600 mg Oral BID  . mouth rinse  15 mL Mouth Rinse BID  . methylPREDNISolone (SOLU-MEDROL) injection  40 mg Intravenous Daily  . pantoprazole  40 mg Oral BID  . zinc sulfate  220 mg Oral Daily   Continuous:  TKZ:[id number], albuterol, guaiFENesin-dextromethorphan, loperamide, [DISCONTINUED] ondansetron **OR** ondansetron (ZOFRAN) IV, sodium chloride   Objective:  Vital Signs  Vitals:   03/29/20 0439 03/29/20 0715 03/29/20 0727 03/29/20 1205  BP: 108/73  119/74 102/78  Pulse: 95  99 99  Resp: 20  (!) 24 20  Temp: 97.9 F (36.6 C)  98.3 F (36.8 C) 97.9 F (36.6 C)  TempSrc: Axillary  Oral Oral  SpO2: 97% (!) 85% (!) 89% 98%  Weight:      Height:        Intake/Output Summary (Last 24 hours) at 03/29/2020 1407 Last data filed at 03/29/2020 0441 Gross per 24 hour  Intake --  Output 400 ml  Net -400 ml   Filed Weights   03/08/20 1111 03/09/20 1006  03/14/20 0434  Weight: 101.6 kg 101.6 kg 93.1 kg    Exam  Awake Alert, Oriented X 3, No new F.N deficits, Normal affect Symmetrical Chest wall movement, Good air movement bilaterally, scattered rales RRR,No Gallops,Rubs or new Murmurs, No Parasternal Heave +ve B.Sounds, Abd Soft, No tenderness, No rebound - guarding or rigidity. No Cyanosis, Clubbing or edema, No new Rash or bruise      Lab Results:  Data Reviewed: I have personally reviewed following labs and imaging studies  CBC: Recent Labs  Lab 03/24/20 0201 03/24/20 0201 03/25/20 0141 03/25/20 0141 03/26/20 0441 03/26/20 2003 03/27/20 0339 03/28/20 0839 03/29/20 0135  WBC 20.4*   < > 20.6*  --  17.5*  --  15.8* 12.9* 13.1*  NEUTROABS 18.7*  --  18.3*  --  14.4*  --  11.9* 10.6*  --   HGB 8.4*   < > 7.4*   < > 6.4* 9.8* 9.2* 9.8* 9.8*  HCT 25.3*   < > 23.2*   < > 20.0* 29.7* 27.6* 29.7* 30.2*  MCV 94.8   < > 96.7  --  97.1  --  95.8 99.0 99.7  PLT 386   < > 386  --  348  --  328 317 318   < > = values in this interval not displayed.    Basic Metabolic Panel: Recent Labs  Lab 03/24/20 0201 03/24/20 0201 03/25/20 0141 03/26/20 0441 03/27/20 0339 03/28/20 0839 03/29/20 0135  NA 133*   < > 134* 132* 134* 137 135  K 4.2   < > 4.3 4.4 3.7 3.8 3.6  CL 97*   < > 99 100 101 104 103  CO2 28   < > 28 27 25  26  25  GLUCOSE 194*   < > 194* 163* 108* 121* 143*  BUN 28*   < > 19 16 11 10 10   CREATININE 0.84   < > 0.82 0.73 0.62 0.63 0.66  CALCIUM 8.2*   < > 8.2* 8.0* 8.0* 7.8* 7.6*  MG 2.0  --  2.0 1.9 1.9 2.1  --    < > = values in this interval not displayed.    GFR: Estimated Creatinine Clearance: 104.4 mL/min (by C-G formula based on SCr of 0.66 mg/dL).  Liver Function Tests: Recent Labs  Lab 03/25/20 0141 03/26/20 0441 03/27/20 0339 03/28/20 0839 03/29/20 0135  AST 55* 40 31 27 21   ALT 106* 97* 84* 75* 64*  ALKPHOS 61 62 59 68 65  BILITOT 0.4 0.4 1.0 0.5 0.4  PROT 5.2* 4.5* 4.6* 4.7* 4.5*  ALBUMIN  2.2* 2.0* 2.0* 2.0* 2.0*   Lab Results  Component Value Date   TSH 0.755 03/23/2020     No results found for this or any previous visit (from the past 240 hour(s)).    Radiology Studies: DG Chest Port 1 View  Result Date: 03/28/2020 CLINICAL DATA:  Respiratory failure due to COVID-19. EXAM: PORTABLE CHEST 1 VIEW COMPARISON:  03/23/2020. FINDINGS: Mediastinum and hilar structures stable. Heart size stable. Diffuse bilateral pulmonary infiltrates again noted without interim change. Low lung volumes. No pleural effusion or pneumothorax. IMPRESSION: Diffuse bilateral pulmonary infiltrates again noted without interim change. Low lung volumes. Electronically Signed   By: Maisie Fushomas  Register   On: 03/28/2020 05:35     LOS: 20 days   Stella Encarnacion  [facility] Pager on [email or web address]  03/29/2020, 2:07 PM

## 2020-03-29 NOTE — Progress Notes (Addendum)
Daily Rounding Note  03/29/2020, 9:59 AM  LOS: 20 days   SUBJECTIVE:   Chief complaint:      No stool yest or yet today RN had to increase highflow Nasal oxygen by 4 liters this AM, his sats were in mid 80s. Tolerating solid food  OBJECTIVE:         Vital signs in last 24 hours:    Temp:  [97.7 F (36.5 C)-98.5 F (36.9 C)] 98.3 F (36.8 C) (10/20 0727) Pulse Rate:  [95-122] 99 (10/20 0727) Resp:  [18-24] 24 (10/20 0727) BP: (92-119)/(60-84) 119/74 (10/20 0727) SpO2:  [91 %-99 %] 97 % (10/20 0727) Last BM Date: 03/24/20 (per pt statement) Filed Weights   03/08/20 1111 03/09/20 1006 03/14/20 0434  Weight: 101.6 kg 101.6 kg 93.1 kg   Not re-examined.    Intake/Output from previous day: 10/19 0701 - 10/20 0700 In: -  Out: 800 [Urine:800]  Intake/Output this shift: No intake/output data recorded.  Lab Results: Recent Labs    03/27/20 0339 03/28/20 0839 03/29/20 0135  WBC 15.8* 12.9* 13.1*  HGB 9.2* 9.8* 9.8*  HCT 27.6* 29.7* 30.2*  PLT 328 317 318   BMET Recent Labs    03/27/20 0339 03/28/20 0839 03/29/20 0135  NA 134* 137 135  K 3.7 3.8 3.6  CL 101 104 103  CO2 25 26 25   GLUCOSE 108* 121* 143*  BUN 11 10 10   CREATININE 0.62 0.63 0.66  CALCIUM 8.0* 7.8* 7.6*   LFT Recent Labs    03/27/20 0339 03/28/20 0839 03/29/20 0135  PROT 4.6* 4.7* 4.5*  ALBUMIN 2.0* 2.0* 2.0*  AST 31 27 21   ALT 84* 75* 64*  ALKPHOS 59 68 65  BILITOT 1.0 0.5 0.4   PT/INR Recent Labs    03/26/20 2003  LABPROT 13.4  INR 1.1   Hepatitis Panel No results for input(s): HEPBSAG, HCVAB, HEPAIGM, HEPBIGM in the last 72 hours.  Studies/Results: DG Chest Port 1 View  Result Date: 03/28/2020 CLINICAL DATA:  Respiratory failure due to COVID-19. EXAM: PORTABLE CHEST 1 VIEW COMPARISON:  03/23/2020. FINDINGS: Mediastinum and hilar structures stable. Heart size stable. Diffuse bilateral pulmonary infiltrates again  noted without interim change. Low lung volumes. No pleural effusion or pneumothorax. IMPRESSION: Diffuse bilateral pulmonary infiltrates again noted without interim change. Low lung volumes. Electronically Signed   By: 03/28/20  Register   On: 03/28/2020 05:35   Scheduled Meds: . (feeding supplement) PROSource Plus  30 mL Oral BID BM  . vitamin C  500 mg Oral Daily  . clonazePAM  0.5 mg Oral BID  . feeding supplement  237 mL Oral TID BM  . guaiFENesin  600 mg Oral BID  . mouth rinse  15 mL Mouth Rinse BID  . methylPREDNISolone (SOLU-MEDROL) injection  40 mg Intravenous Daily  . pantoprazole (PROTONIX) IV  40 mg Intravenous Q12H  . zinc sulfate  220 mg Oral Daily   Continuous Infusions: PRN Meds:.acetaminophen, albuterol, guaiFENesin-dextromethorphan, loperamide, [DISCONTINUED] ondansetron **OR** ondansetron (ZOFRAN) IV, sodium chloride  ASSESMENT:   *   Blood loss anemia.  Black stool, FOBT +.      Hgb stable post PRBCs.  Hgb 6.4 >> 2 PRBCs >> 9.8 Protonix bid iv started 10/17, pepcid po prior to that.   Bleeding appears to have stopped.  No previous EGD or colonoscopy.     *   Week 4 COVID PNA admission.  Remains on Solumedrol and still w high  flow oxygen requirements.     PLAN   *   Switch to bid po protonix.   Will ask attending MD to let us know when resp stableizes so we can pursue EGD, no urgency at this point.      Tyler Jackson  03/29/2020, 9:59 AM Phone [phone]  I have discussed the case with the PA, and that is the plan I formulated. I personally interviewed and examined the patient.  Not examined today in order to decrease staff  COVID exposure.  Patient reportedly has no overt GI bleeding over 72 hours, his hemoglobin is remained stable after initial transfusion, so we will hold off on upper endoscopy until his respiratory status is improved.  [facility] has agreed to contact us when they feel he is in better respiratory condition to tolerate endoscopy  related sedation.  Dr. Randol Kern asked about resuming Lovenox because of high risk for DVT in these patients.  It is of course the calculated risk, but I agree with resuming it since the patient has been stable a few days and will remain on high-dose PPI.  If bleeding recurs, clearly the Lovenox must be stopped and the endoscopy might need to happen sooner.  We will sign off and await further contact from the [facility].   Charlie Pitter III Office: [phone]

## 2020-03-29 NOTE — Plan of Care (Signed)
  Problem: Education: Goal: Knowledge of General Education information will improve Description: Including pain rating scale, medication(s)/side effects and non-pharmacologic comfort measures Outcome: Progressing   Problem: Health Behavior/Discharge Planning: Goal: Ability to manage health-related needs will improve Outcome: Progressing   Problem: Clinical Measurements: Goal: Ability to maintain clinical measurements within normal limits will improve Outcome: Progressing Goal: Will remain free from infection Outcome: Progressing Goal: Diagnostic test results will improve Outcome: Progressing Goal: Respiratory complications will improve Outcome: Progressing Goal: Cardiovascular complication will be avoided Outcome: Progressing   Problem: Activity: Goal: Risk for activity intolerance will decrease Outcome: Progressing   Problem: Nutrition: Goal: Adequate nutrition will be maintained Outcome: Progressing   Problem: Coping: Goal: Level of anxiety will decrease Outcome: Progressing   Problem: Elimination: Goal: Will not experience complications related to bowel motility Outcome: Progressing Goal: Will not experience complications related to urinary retention Outcome: Progressing   Problem: Safety: Goal: Ability to remain free from injury will improve Outcome: Progressing   Problem: Skin Integrity: Goal: Risk for impaired skin integrity will decrease Outcome: Progressing   Problem: Education: Goal: Knowledge of risk factors and measures for prevention of condition will improve Outcome: Progressing   Problem: Coping: Goal: Psychosocial and spiritual needs will be supported Outcome: Progressing   Problem: Respiratory: Goal: Will maintain a patent airway Outcome: Progressing Goal: Complications related to the disease process, condition or treatment will be avoided or minimized Outcome: Progressing   Problem: Education: Goal: Knowledge of General Education  information will improve Description: Including pain rating scale, medication(s)/side effects and non-pharmacologic comfort measures Outcome: Progressing   

## 2020-03-29 NOTE — Progress Notes (Signed)
   03/29/20 1615  Vitals  Temp 97.9 F (36.6 C)  Temp Source Oral  BP 95/61  MAP (mmHg) 71  BP Location Right Arm  BP Method Automatic  Patient Position (if appropriate) Lying  Pulse Rate 100  Pulse Rate Source Monitor  ECG Heart Rate 99  Resp 20  MEWS COLOR  MEWS Score Color Green  Oxygen Therapy  SpO2 98 %  O2 Device HFNC  O2 Flow Rate (L/min) 6 L/min  MEWS Score  MEWS Temp 0  MEWS Systolic 1  MEWS Pulse 0  MEWS RR 0  MEWS LOC 0  MEWS Score 1

## 2020-03-30 LAB — CBC
HCT: 30.9 % — ABNORMAL LOW (ref 39.0–52.0)
Hemoglobin: 9.9 g/dL — ABNORMAL LOW (ref 13.0–17.0)
MCH: 31.6 pg (ref 26.0–34.0)
MCHC: 32 g/dL (ref 30.0–36.0)
MCV: 98.7 fL (ref 80.0–100.0)
Platelets: 273 10*3/uL (ref 150–400)
RBC: 3.13 MIL/uL — ABNORMAL LOW (ref 4.22–5.81)
RDW: 16.2 % — ABNORMAL HIGH (ref 11.5–15.5)
WBC: 11.1 10*3/uL — ABNORMAL HIGH (ref 4.0–10.5)
nRBC: 0 % (ref 0.0–0.2)

## 2020-03-30 LAB — COMPREHENSIVE METABOLIC PANEL
ALT: 52 U/L — ABNORMAL HIGH (ref 0–44)
AST: 19 U/L (ref 15–41)
Albumin: 2.1 g/dL — ABNORMAL LOW (ref 3.5–5.0)
Alkaline Phosphatase: 61 U/L (ref 38–126)
Anion gap: 9 (ref 5–15)
BUN: 9 mg/dL (ref 8–23)
CO2: 25 mmol/L (ref 22–32)
Calcium: 7.8 mg/dL — ABNORMAL LOW (ref 8.9–10.3)
Chloride: 101 mmol/L (ref 98–111)
Creatinine, Ser: 0.65 mg/dL (ref 0.61–1.24)
GFR, Estimated: >60 mL/min (ref >60)
Glucose, Bld: 168 mg/dL — ABNORMAL HIGH (ref 70–99)
Potassium: 3.2 mmol/L — ABNORMAL LOW (ref 3.5–5.1)
Sodium: 135 mmol/L (ref 135–145)
Total Bilirubin: 0.4 mg/dL (ref 0.3–1.2)
Total Protein: 5 g/dL — ABNORMAL LOW (ref 6.5–8.1)

## 2020-03-30 LAB — D-DIMER, QUANTITATIVE: D-Dimer, Quant: 1.77 ug/mL-FEU — ABNORMAL HIGH (ref 0.00–0.50)

## 2020-03-30 MED ORDER — ENSURE ENLIVE PO LIQD
237.0000 mL | Freq: Two times a day (BID) | ORAL | Status: DC
  Administered 2020-04-02 (×2): 237 mL via ORAL

## 2020-03-30 MED ORDER — POTASSIUM CHLORIDE CRYS ER 20 MEQ PO TBCR
40.0000 meq | EXTENDED_RELEASE_TABLET | Freq: Four times a day (QID) | ORAL | Status: AC
  Administered 2020-03-30 (×2): 40 meq via ORAL
  Filled 2020-03-30 (×2): qty 2

## 2020-03-30 NOTE — Progress Notes (Signed)
Physical Therapy Treatment Patient Details Name: Tyler Jackson MRN: [id number] DOB: 06-Apr-1951 Today's Date: 03/30/2020    History of Present Illness Pt is a 69 y.o. male admitted 03/08/20 with worsening SOB and cough; workup for hyoxemia, tested (+) COVID-19. CT angiogram negative for PE, but imaging showed extensive bilateral opacities. Developed orthostasis. No significant PMH.    PT Comments    Patient up to chair with nursing and very uncomfortable on arrival. Patient agrees to work on standing. Requires incr time to mentally prepare himself due to fall and injury to left thigh on 10/15, due to his decr respiratory status, and general anxiety. Patient was able to stand twice with progression to pre-gait lateral weight-shifting. Reported mild dizziness with slight drop in BP (see below) and incr in HR. Did not feel he could tolerate standing or walking and agreed to LE exercises. Sats remained >88% (and typically 90-93%) throughout session on 8L HFNC.    03/30/20 1000  Orthostatic Sitting  BP- Sitting 97/63  Pulse- Sitting 113  Orthostatic Standing at 0 minutes  BP- Standing at 0 minutes (!) 88/61  Pulse- Standing at 0 minutes 125  Oxygen Therapy  SpO2 100 %  O2 Flow Rate (L/min) 8 L/min  Patient Activity (if Appropriate) In chair     Follow Up Recommendations  SNF;Supervision/Assistance - 24 hour     Equipment Recommendations  Other (comment) (TBD at SNF)    Recommendations for Other Services       Precautions / Restrictions Precautions Precautions: Fall;Other (comment) Precaution Comments: +syncope/orthostasis; Watch HR    Mobility  Bed Mobility               General bed mobility comments: up in recliner on arival; up with RW and 2 person assist per pt  Transfers Overall transfer level: Needs assistance Equipment used: Rolling walker (2 wheeled) Transfers: Sit to/from Stand Sit to Stand: Min assist;+2 physical assistance;Min guard         General  transfer comment: requires incr time to "prepare" himself; max cues for best hand placement using RW (each time x 2 reps)  Ambulation/Gait Ambulation/Gait assistance: Min guard Gait Distance (Feet): 0 Feet Assistive device: Rolling walker (2 wheeled)       General Gait Details: pre-gait lateral wt-shifting for up to 60 seconds   Stairs             Wheelchair Mobility    Modified Rankin (Stroke Patients Only)       Balance Overall balance assessment: Needs assistance Sitting-balance support: No upper extremity supported;Feet supported Sitting balance-Leahy Scale: Fair     Standing balance support: Bilateral upper extremity supported Standing balance-Leahy Scale: Poor Standing balance comment: requires bil UE support                             Cognition Arousal/Alertness: Awake/alert Behavior During Therapy: Anxious (irritable (feels he's being rushed)) Overall Cognitive Status: No family/caregiver present to determine baseline cognitive functioning Area of Impairment: Attention;Awareness;Problem solving                   Current Attention Level: Sustained       Awareness: Intellectual;Emergent Problem Solving: Requires verbal cues General Comments: Patient easily irritated by cords, lines, alarms.       Exercises General Exercises - Lower Extremity Long Arc Quad: Strengthening;10 reps;Seated;Right Heel Slides: Left;5 reps;AROM;Seated (x 3 sets; foot on washcloth to decr friction) Hip Flexion/Marching: AAROM;Right;5 reps;Standing (standing  at bedrail (bed elevated for LUE WB); march RLE ) Heel Raises: AROM;Both;10 reps;Seated    General Comments General comments (skin integrity, edema, etc.): on 8L at rest with HR 113 Sats 90-93% on room air; During standing HR max 128 bpm and sats >90%.  Required moderated cues for calming/relaxing himself as he begins to feel his oxygen sats are dropping. (Pt's anxiiety increases with decr respiratory  status)      Pertinent Vitals/Pain Pain Assessment: Faces Faces Pain Scale: Hurts little more Pain Location: left inner thigh Pain Descriptors / Indicators: Guarding;Sore Pain Intervention(s): Limited activity within patient's tolerance;Monitored during session;Repositioned    Home Living                      Prior Function            PT Goals (current goals can now be found in the care plan section) Acute Rehab PT Goals Patient Stated Goal: to get stronger, breathe better PT Goal Formulation: With patient Time For Goal Achievement: 04/02/20 Potential to Achieve Goals: Good Progress towards PT goals: Progressing toward goals    Frequency    Min 3X/week      PT Plan Current plan remains appropriate    Co-evaluation PT/OT/SLP Co-Evaluation/Treatment: Yes Reason for Co-Treatment: Necessary to address cognition/behavior during functional activity;For patient/therapist safety          AM-PAC PT "6 Clicks" Mobility   Outcome Measure  Help needed turning from your back to your side while in a flat bed without using bedrails?: None Help needed moving from lying on your back to sitting on the side of a flat bed without using bedrails?: A Little Help needed moving to and from a bed to a chair (including a wheelchair)?: A Lot Help needed standing up from a chair using your arms (e.g., wheelchair or bedside chair)?: A Lot Help needed to walk in hospital room?: Total Help needed climbing 3-5 steps with a railing? : Total 6 Click Score: 13    End of Session Equipment Utilized During Treatment: Oxygen Activity Tolerance: Patient limited by pain Patient left: with call bell/phone within reach;in chair Nurse Communication: Mobility status PT Visit Diagnosis: Other abnormalities of gait and mobility (R26.89);Muscle weakness (generalized) (M62.81)     Time: 7628-3151 PT Time Calculation (min) (ACUTE ONLY): 40 min  Charges:  $Gait Training: 8-22  mins $Therapeutic Exercise: 8-22 mins                      Tyler Jackson, PT Pager [phone]    Tyler Jackson 03/30/2020, 12:28 PM

## 2020-03-30 NOTE — Progress Notes (Signed)
PROGRESS NOTE  Tyler Jackson KGM:[id number] DOB: 06-17-1950 DOA: 03/08/2020  PCP: Patient, No Pcp Per  Brief History/Interval Summary: 69 y.o. male with no significant past medical history presented to emergency department with worsening shortness of breath and cough since 1 week. He is not vaccinated against COVID-19.  He was noted to be tachycardic tachypneic and hypoxemic.  Requiring high flow nasal cannula.  COVID-19 test came back positive.  CT angiogram was negative for PE but showed extensive bilateral opacities.  Patient was hospitalized for further management.  Reason for Visit: Acute respiratory failure with hypoxia.  Pneumonia due to COVID-19  Consultants: GI  Procedures:   CT -  TTE - 1. Left ventricular ejection fraction, by estimation, is 60 to 65%. The left ventricle has normal function. The left ventricle has no regional wall motion abnormalities. There is moderate left ventricular hypertrophy. Left ventricular diastolic parameters are indeterminate.  2. Right ventricular systolic function is normal. The right ventricular size is normal. Tricuspid regurgitation signal is inadequate for assessing PA pressure.  3. The mitral valve is normal in structure. No evidence of mitral valve regurgitation. No evidence of mitral stenosis.  4. The aortic valve is tricuspid. Aortic valve regurgitation is not visualized. No aortic stenosis is present.  5. The inferior vena cava is normal in size with greater than 50% respiratory variability, suggesting right atrial pressure of 3 mmHg  Antibiotics: Anti-infectives (From admission, onward)   Start     Dose/Rate Route Frequency Ordered Stop   03/23/20 0830  levofloxacin (LEVAQUIN) tablet 750 mg  Status:  Discontinued        750 mg Oral Daily 03/23/20 0736 03/26/20 0714   03/09/20 1000  remdesivir 100 mg in sodium chloride 0.9 % 100 mL IVPB        100 mg 200 mL/hr over 30 Minutes Intravenous Daily 03/08/20 1338 03/12/20 0948   03/08/20 1645   cefTRIAXone (ROCEPHIN) 1 g in sodium chloride 0.9 % 100 mL IVPB        1 g 200 mL/hr over 30 Minutes Intravenous Every 24 hours 03/08/20 1640 03/12/20 1630   03/08/20 1645  azithromycin (ZITHROMAX) 500 mg in sodium chloride 0.9 % 250 mL IVPB        500 mg 250 mL/hr over 60 Minutes Intravenous Every 24 hours 03/08/20 1640 03/13/20 1644   03/08/20 1430  remdesivir 100 mg in sodium chloride 0.9 % 100 mL IVPB       "Followed by" Linked Group Details   100 mg 200 mL/hr over 30 Minutes Intravenous  Once 03/08/20 1338 03/08/20 1508   03/08/20 1400  remdesivir 100 mg in sodium chloride 0.9 % 100 mL IVPB       "Followed by" Linked Group Details   100 mg 200 mL/hr over 30 Minutes Intravenous  Once 03/08/20 1338 03/08/20 1434      Subjective/Interval history :  Patient reports dyspnea with cough at baseline, notable provement over the last 24 hours.    Assessment/Plan:  Acute Hypoxic Resp. Failure/Pneumonia due to COVID-19   - unfortunately he is unvaccinated and has incurred severe lung parenchymal injury from Covid PNA. -He was treated for total of 14 days of baricitinib . -Treated with IV steroids, has been tapered, will give last dose today 10/20 . -Did with remdesivir . -He is on 8 L via high flow nasal cannula.  Encouraged to sit up in chair use I-S and flutter valve for pulmonary toiletry and prone at night but patient is  resistant towards that despite multiple sessions of counseling, fortunately he is showing some signs of improvement although slow and gradual.   SpO2: 100 % O2 Flow Rate (L/min): 8 L/min FiO2 (%): 50 %   Recent Labs  Lab 03/24/20 0201 03/24/20 0201 03/25/20 0141 03/25/20 0141 03/26/20 0441 03/26/20 2003 03/27/20 0339 03/28/20 0839 03/29/20 0135 03/30/20 0509  WBC 20.4*   < > 20.6*   < > 17.5*  --  15.8* 12.9* 13.1* 11.1*  CRP 0.6  --  0.5  --  0.6  --  0.5 2.7*  --   --   DDIMER 0.95*   < > 1.02*   < > 0.80*  --  1.56* 1.86* 1.87* 1.77*  BNP 53.0  --   49.2  --  74.3  --  70.6 54.5  --   --   PROCALCITON 0.13  --  <0.10  --  <0.10  --  <0.10 <0.10  --   --   AST 47*   < > 55*   < > 40  --  31 27 21 19   ALT 65*   < > 106*   < > 97*  --  84* 75* 64* 52*  ALKPHOS 65   < > 61   < > 62  --  59 68 65 61  BILITOT 0.6   < > 0.4   < > 0.4  --  1.0 0.5 0.4 0.4  ALBUMIN 2.3*   < > 2.2*   < > 2.0*  --  2.0* 2.0* 2.0* 2.1*  INR  --   --   --   --   --  1.1  --   --   --   --    < > = values in this interval not displayed.     Acute blood loss anemia Due to GI bleed -Stools have been intermittently dark, BUN is stable, no GI symptoms, s/p 2 units of packed RBC on 03/26/2020 after which H&H is stable, Hemoccult positive. -GI consult greatly appreciated, given stable hemoglobin, no recurrent melena, and risks outweighed benefits for EGD at this point, will hold endoscopy to respiratory status more stable . -Patient resumed back on DVT prophylaxis dose of Lovenox given high risk for VTE.  High D-dimer.   - Due to inflammation, improved after high-dose Lovenox.  High dose Lovenox has been held due to GI bleed. -DVT prophylaxis dose Lovenox 10/20.  Hypokalemia -Potassium was 3.2, repleted.  Thrombocytosis - Likely reactive due to COVID-19.  Improved.  Check periodically.  Leukocytosis - Secondary to steroids.  Stable.  Transaminitis - Secondary to COVID-19.  Resolved.  Mild diarrhea ongoing from 03/22/2020.  As needed Imodium and gentle hydration, stable TSH.    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 Incidentally noted on CT angiogram. Annual imaging is recommended, follow with vascular surgery post discharge.    Syncope and fall on 03/24/2020 due to orthostatic hypotension.  He had incurred some left leg injury, x-ray of left hip, tibia-fibula negative, no ongoing discomfort, echocardiogram stable, blood pressure much better after IV fluids and blood transfusion, gradually advance activity and  monitor.     DVT Prophylaxis: Lovenox  Code Status: Full code Family Communication:   Patient is awake, alert and appropriate, I have discussed at length with the patient Disposition Plan: Hopefully return home when improved  Status is: Inpatient  Remains inpatient appropriate because:IV treatments appropriate due to intensity of illness or  inability to take PO and Inpatient level of care appropriate due to severity of illness   Dispo: The patient is from: Home              Anticipated d/c is to: SNF              Anticipated d/c date is: > 3 days              Patient currently is not medically stable to d/c.    Medications:   Scheduled: . (feeding supplement) PROSource Plus  30 mL Oral BID BM  . vitamin C  500 mg Oral Daily  . clonazePAM  0.5 mg Oral BID  . enoxaparin (LOVENOX) injection  40 mg Subcutaneous Q24H  . feeding supplement  237 mL Oral TID BM  . guaiFENesin  600 mg Oral BID  . mouth rinse  15 mL Mouth Rinse BID  . pantoprazole  40 mg Oral BID  . potassium chloride  40 mEq Oral Q6H  . zinc sulfate  220 mg Oral Daily   Continuous:  FAO:ZHYQMVHQIONGE, albuterol, guaiFENesin-dextromethorphan, loperamide, [DISCONTINUED] ondansetron **OR** ondansetron (ZOFRAN) IV, sodium chloride   Objective:  Vital Signs  Vitals:   03/30/20 0500 03/30/20 0747 03/30/20 1000 03/30/20 1143  BP: 115/79 97/69  108/80  Pulse: 99 100  100  Resp: 20 20  20   Temp: 98.2 F (36.8 C) 98.3 F (36.8 C)  98.1 F (36.7 C)  TempSrc: Oral Axillary  Axillary  SpO2: 99% 90% 100% 100%  Weight:      Height:        Intake/Output Summary (Last 24 hours) at 03/30/2020 1430 Last data filed at 03/30/2020 1000 Gross per 24 hour  Intake 720 ml  Output 1700 ml  Net -980 ml   Filed Weights   03/08/20 1111 03/09/20 1006 03/14/20 0434  Weight: 101.6 kg 101.6 kg 93.1 kg    Exam  Awake Alert, Oriented X 3, No new F.N deficits, Normal affect Symmetrical Chest wall movement, Good air  movement bilaterally, CTAB RRR,No Gallops,Rubs or new Murmurs, No Parasternal Heave +ve B.Sounds, Abd Soft, No tenderness, No rebound - guarding or rigidity. No Cyanosis, Clubbing or edema, No new Rash or bruise    Lab Results:  Data Reviewed: I have personally reviewed following labs and imaging studies  CBC: Recent Labs  Lab 03/24/20 0201 03/24/20 0201 03/25/20 0141 03/25/20 0141 03/26/20 0441 03/26/20 0441 03/26/20 2003 03/27/20 0339 03/28/20 0839 03/29/20 0135 03/30/20 0509  WBC 20.4*   < > 20.6*   < > 17.5*  --   --  15.8* 12.9* 13.1* 11.1*  NEUTROABS 18.7*  --  18.3*  --  14.4*  --   --  11.9* 10.6*  --   --   HGB 8.4*   < > 7.4*   < > 6.4*   < > 9.8* 9.2* 9.8* 9.8* 9.9*  HCT 25.3*   < > 23.2*   < > 20.0*   < > 29.7* 27.6* 29.7* 30.2* 30.9*  MCV 94.8   < > 96.7   < > 97.1  --   --  95.8 99.0 99.7 98.7  PLT 386   < > 386   < > 348  --   --  328 317 318 273   < > = values in this interval not displayed.    Basic Metabolic Panel: Recent Labs  Lab 03/24/20 0201 03/24/20 0201 03/25/20 0141 03/25/20 0141 03/26/20 0441 03/27/20 0339 03/28/20 0839 03/29/20  0135 03/30/20 0509  NA 133*   < > 134*   < > 132* 134* 137 135 135  K 4.2   < > 4.3   < > 4.4 3.7 3.8 3.6 3.2*  CL 97*   < > 99   < > 100 101 104 103 101  CO2 28   < > 28   < > 27 25 26 25 25   GLUCOSE 194*   < > 194*   < > 163* 108* 121* 143* 168*  BUN 28*   < > 19   < > 16 11 10 10 9   CREATININE 0.84   < > 0.82   < > 0.73 0.62 0.63 0.66 0.65  CALCIUM 8.2*   < > 8.2*   < > 8.0* 8.0* 7.8* 7.6* 7.8*  MG 2.0  --  2.0  --  1.9 1.9 2.1  --   --    < > = values in this interval not displayed.    GFR: Estimated Creatinine Clearance: 104.4 mL/min (by C-G formula based on SCr of 0.65 mg/dL).  Liver Function Tests: Recent Labs  Lab 03/26/20 0441 03/27/20 0339 03/28/20 0839 03/29/20 0135 03/30/20 0509  AST 40 31 27 21 19   ALT 97* 84* 75* 64* 52*  ALKPHOS 62 59 68 65 61  BILITOT 0.4 1.0 0.5 0.4 0.4  PROT  4.5* 4.6* 4.7* 4.5* 5.0*  ALBUMIN 2.0* 2.0* 2.0* 2.0* 2.1*   Lab Results  Component Value Date   TSH 0.755 03/23/2020     No results found for this or any previous visit (from the past 240 hour(s)).    Radiology Studies: No results found.   LOS: 21 days   04/01/20 on [email or web address]  03/30/2020, 2:30 PM

## 2020-03-30 NOTE — Progress Notes (Signed)
Nutrition Follow-up  DOCUMENTATION CODES:   Not applicable  INTERVENTION:  Continue Ensure Enlive po BID, each supplement provides 350 kcal and 20 grams of protein.  Continue 30 ml Prosource Plus po BID, each supplement provides 100 kcal and 15 grams of protein.   Encourage adequate PO intake.   NUTRITION DIAGNOSIS:   Increased nutrient needs related to catabolic illness (COVID) as evidenced by estimated needs; ongoing  GOAL:   Patient will meet greater than or equal to 90% of their needs; progressing  MONITOR:   PO intake, Supplement acceptance, Skin, Weight trends, Labs, I & O's  REASON FOR ASSESSMENT:   Malnutrition Screening Tool    ASSESSMENT:   69 y.o. male with no significant past medical history presents with shortness of breath and cough. Pt COVID positive. Pt with acute hypoxic resp. failure/pneumonia due to COVID-19  Pt is currently on 8 L HFNC and improving. Meal completion has been 50-95%. Pt currently has Ensure and Prosource plus ordered with varied consumption. RD to continue with nutritional supplementation to aid in increased caloric and protein needs. Pt encouraged to eat his food at meals and to drink his supplements.    Labs and medications reviewed.   Diet Order:   Diet Order            Diet regular Room service appropriate? Yes; Fluid consistency: Thin  Diet effective now                 EDUCATION NEEDS:   Not appropriate for education at this time  Skin:  Skin Assessment: Reviewed RN Assessment  Last BM:  10/21  Height:   Ht Readings from Last 1 Encounters:  03/09/20 5\' 11"  (1.803 m)    Weight:   Wt Readings from Last 1 Encounters:  03/14/20 93.1 kg    BMI:  Body mass index is 28.63 kg/m.  Estimated Nutritional Needs:   Kcal:  2200-2400  Protein:  110-120 grams  Fluid:  >/= 2 L/day  05/14/20, MS, RD, LDN RD pager number/after hours weekend pager number on Amion.

## 2020-03-30 NOTE — TOC Progression Note (Addendum)
Transition of Care [facility]) - Progression Note    Patient Details  Name: Tyler Jackson MRN: [id number] Date of Birth: April 28, 1951  Transition of Care [facility]) CM/SW Contact  Mearl Latin, LCSW Phone Number: 03/30/2020, 10:00 AM  Clinical Narrative:    10AM-CSW received consult for possible SNF placement at time of discharge. CSW spoke with patient regarding PT recommendation of SNF placement at time of discharge. Patient reported that he lives alone. Patient expressed understanding of PT recommendation and is agreeable to SNF placement at time of discharge. CSW made him aware of need to quarantine at SNF without visitors and he expressed understanding. Patient reports preference for somewhere in [facility] but he requested CSW contact his sister to provide bed offers as he is not familiar with any of the places. CSW left voicemail for [facility]. CSW discussed insurance authorization process and provided Medicare SNF ratings list. No further questions reported at this time.   12PM-CSW received return call from patient's sister. CSW discussed SNF bed offers and emailed them to [facility]. She will review list tonight and let CSW know choice tomorrow.    Expected Discharge Plan: Skilled Nursing Facility Barriers to Discharge: Continued Medical Work up  Expected Discharge Plan and Services Expected Discharge Plan: Skilled Nursing Facility In-house Referral: Clinical Social Work Discharge Planning Services: CM Consult Post Acute Care Choice: Skilled Nursing Facility Living arrangements for the past 2 months: Single Family Home                           HH Arranged: NA           Social Determinants of Health (SDOH) Interventions    Readmission Risk Interventions No flowsheet data found.

## 2020-03-30 NOTE — Progress Notes (Addendum)
Occupational Therapy Treatment Patient Details Name: Tyler Jackson MRN: [id number] DOB: Dec 08, 1950 Today's Date: 03/30/2020    History of present illness Pt is a 69 y.o. male admitted 03/08/20 with worsening SOB and cough; workup for hyoxemia, tested (+) COVID-19. CT angiogram negative for PE, but imaging showed extensive bilateral opacities. Developed orthostasis. No significant PMH.   OT comments  Upon arrival, pt sitting in recliner and reporting pain at LLE. Pt continue to present with poor balance, strength, cognition, and activity tolerance. Pt requiring significant time and cues to perform sit<>stand with Min A +2 and RW. Pt fatigues quickly and reports dizziness with first sit<>stand; see below. SpO2 >88% on 8L via HFNC during session. Pt agreeable to further LE AROM while seated. Continue to recommend dc to SNF and will continue to follow acutely as admitted.    03/30/20 1000  Orthostatic Sitting  BP- Sitting 97/63  Pulse- Sitting 113  Orthostatic Standing at 0 minutes  BP- Standing at 0 minutes (!) 88/61  Pulse- Standing at 0 minutes 125  Oxygen Therapy  SpO2 100 %  O2 Flow Rate (L/min) 8 L/min  Patient Activity (if Appropriate) In chair      Follow Up Recommendations  SNF;Supervision/Assistance - 24 hour    Equipment Recommendations  3 in 1 bedside commode    Recommendations for Other Services      Precautions / Restrictions Precautions Precautions: Fall;Other (comment) Precaution Comments: +syncope/orthostasis; Watch HR       Mobility Bed Mobility               General bed mobility comments: up in recliner on arival; up with RW and 2 person assist per pt  Transfers Overall transfer level: Needs assistance Equipment used: Rolling walker (2 wheeled) Transfers: Sit to/from Stand Sit to Stand: Min assist;+2 physical assistance;Min guard         General transfer comment: Min A +2 to power up into standing. Requiring increased time to "prepare"  himself. Max cues for hand placement and breathing    Balance Overall balance assessment: Needs assistance Sitting-balance support: No upper extremity supported;Feet supported Sitting balance-Leahy Scale: Fair     Standing balance support: Bilateral upper extremity supported Standing balance-Leahy Scale: Poor Standing balance comment: requires bil UE support                            ADL either performed or assessed with clinical judgement   ADL Overall ADL's : Needs assistance/impaired                                     Functional mobility during ADLs: Minimal assistance;+2 for physical assistance;Rolling walker (sit<>stand and weight shifting) General ADL Comments: Pt presenting with decreased balance and activity toelrance. Pt also quickly irritatible impacting his participation; feel this is due to cognition deficits and poor problem solving.     Vision       Perception     Praxis      Cognition Arousal/Alertness: Awake/alert Behavior During Therapy: Anxious (irritable (feels he's being rushed)) Overall Cognitive Status: No family/caregiver present to determine baseline cognitive functioning Area of Impairment: Attention;Awareness;Problem solving;Following commands                   Current Attention Level: Sustained   Following Commands: Follows one step commands inconsistently;Follows one step commands with increased time;Follows multi-step commands  inconsistently   Awareness: Intellectual;Emergent Problem Solving: Requires verbal cues General Comments: Patient easily irritated by cords, lines, alarms. Pt asking for more time to process and then becoming irritated at therapists "just standing there." Requiring cues for problem solving and to decrease anxiety        Exercises Exercises: General Lower Extremity General Exercises - Lower Extremity Long Arc Quad: Strengthening;10 reps;Seated;Right Heel Slides: Left;5  reps;AROM;Seated (x 3 sets; foot on washcloth to decr friction) Hip Flexion/Marching: AAROM;Right;5 reps;Standing (standing at bedrail (bed elevated for LUE WB); march RLE ) Heel Raises: AROM;Both;10 reps;Seated   Shoulder Instructions       General Comments on 8L at rest with HR 113 Sats 90-93% on room air; During standing HR max 128 bpm and sats >90%.  Required moderated cues for calming/relaxing himself as he begins to feel his oxygen sats are dropping. (Pt's anxiiety increases with decr respiratory status)    Pertinent Vitals/ Pain       Pain Assessment: Faces Faces Pain Scale: Hurts little more Pain Location: left inner thigh Pain Descriptors / Indicators: Guarding;Sore Pain Intervention(s): Monitored during session;Limited activity within patient's tolerance;Repositioned  Home Living                                          Prior Functioning/Environment              Frequency  Min 2X/week        Progress Toward Goals  OT Goals(current goals can now be found in the care plan section)  Progress towards OT goals: Progressing toward goals  Acute Rehab OT Goals Patient Stated Goal: to get stronger, breathe better OT Goal Formulation: With patient Time For Goal Achievement: 04/03/20 Potential to Achieve Goals: Good ADL Goals Pt Will Perform Grooming: with supervision;standing Pt Will Perform Upper Body Dressing: with modified independence;sitting Pt Will Perform Lower Body Dressing: with supervision;sit to/from stand Pt Will Transfer to Toilet: with supervision;ambulating Pt Will Perform Toileting - Clothing Manipulation and hygiene: with supervision;sit to/from stand Pt/caregiver will Perform Home Exercise Program: Right Upper extremity;Left upper extremity;With theraband;With written HEP provided;Independently Additional ADL Goal #1: Pt will recall and implement at least 3 ways to conserve energy during ADL routine with no cues  Plan Discharge  plan remains appropriate    Co-evaluation    PT/OT/SLP Co-Evaluation/Treatment: Yes Reason for Co-Treatment: Necessary to address cognition/behavior during functional activity;For patient/therapist safety;To address functional/ADL transfers   OT goals addressed during session: ADL's and self-care      AM-PAC OT "6 Clicks" Daily Activity     Outcome Measure   Help from another person eating meals?: A Little Help from another person taking care of personal grooming?: A Little Help from another person toileting, which includes using toliet, bedpan, or urinal?: A Little Help from another person bathing (including washing, rinsing, drying)?: A Lot Help from another person to put on and taking off regular upper body clothing?: A Little Help from another person to put on and taking off regular lower body clothing?: A Lot 6 Click Score: 16    End of Session Equipment Utilized During Treatment: Oxygen (8L)  OT Visit Diagnosis: Other abnormalities of gait and mobility (R26.89);Muscle weakness (generalized) (M62.81);Other symptoms and signs involving cognitive function   Activity Tolerance Patient limited by fatigue   Patient Left in chair;with call bell/phone within reach;with chair alarm set   Nurse Communication  Mobility status        Time: 1937-9024 OT Time Calculation (min): 40 min  Charges: OT General Charges $OT Visit: 1 Visit OT Treatments $Therapeutic Activity: 8-22 mins  Derrious Bologna MSOT, OTR/L Acute Rehab Pager: [phone] Office: [phone]   Theodoro Grist Roxie Kreeger 03/30/2020, 1:36 PM

## 2020-03-31 DIAGNOSIS — R000 Tachycardia, unspecified: Secondary | ICD-10-CM

## 2020-03-31 DIAGNOSIS — J9601 Acute respiratory failure with hypoxia: Secondary | ICD-10-CM

## 2020-03-31 DIAGNOSIS — U071 COVID-19: Principal | ICD-10-CM

## 2020-03-31 LAB — COMPREHENSIVE METABOLIC PANEL
ALT: 57 U/L — ABNORMAL HIGH (ref 0–44)
AST: 22 U/L (ref 15–41)
Albumin: 2.1 g/dL — ABNORMAL LOW (ref 3.5–5.0)
Alkaline Phosphatase: 64 U/L (ref 38–126)
Anion gap: 6 (ref 5–15)
BUN: 10 mg/dL (ref 8–23)
CO2: 29 mmol/L (ref 22–32)
Calcium: 8 mg/dL — ABNORMAL LOW (ref 8.9–10.3)
Chloride: 102 mmol/L (ref 98–111)
Creatinine, Ser: 0.65 mg/dL (ref 0.61–1.24)
GFR, Estimated: >60 mL/min (ref >60)
Glucose, Bld: 133 mg/dL — ABNORMAL HIGH (ref 70–99)
Potassium: 4.1 mmol/L (ref 3.5–5.1)
Sodium: 137 mmol/L (ref 135–145)
Total Bilirubin: 0.2 mg/dL — ABNORMAL LOW (ref 0.3–1.2)
Total Protein: 4.6 g/dL — ABNORMAL LOW (ref 6.5–8.1)

## 2020-03-31 LAB — CBC
HCT: 33.2 % — ABNORMAL LOW (ref 39.0–52.0)
Hemoglobin: 10.5 g/dL — ABNORMAL LOW (ref 13.0–17.0)
MCH: 31.5 pg (ref 26.0–34.0)
MCHC: 31.6 g/dL (ref 30.0–36.0)
MCV: 99.7 fL (ref 80.0–100.0)
Platelets: 310 10*3/uL (ref 150–400)
RBC: 3.33 MIL/uL — ABNORMAL LOW (ref 4.22–5.81)
RDW: 16.2 % — ABNORMAL HIGH (ref 11.5–15.5)
WBC: 9.8 10*3/uL (ref 4.0–10.5)
nRBC: 0 % (ref 0.0–0.2)

## 2020-03-31 LAB — D-DIMER, QUANTITATIVE: D-Dimer, Quant: 1.61 ug/mL-FEU — ABNORMAL HIGH (ref 0.00–0.50)

## 2020-03-31 MED ORDER — CLONAZEPAM 0.5 MG PO TABS
0.5000 mg | ORAL_TABLET | Freq: Two times a day (BID) | ORAL | Status: DC
  Administered 2020-03-31 – 2020-04-02 (×6): 0.5 mg via ORAL
  Filled 2020-03-31 (×6): qty 1

## 2020-03-31 MED ORDER — SODIUM CHLORIDE 0.9 % IV SOLN: INTRAVENOUS | Status: AC

## 2020-03-31 NOTE — TOC Progression Note (Addendum)
Transition of Care [facility]) - Progression Note    Patient Details  Name: Tyler Jackson MRN: [id number] Date of Birth: 02/21/1951  [facility]) CM/SW Contact  Mearl Latin, LCSW Phone Number: 03/31/2020, 1:37 PM  Clinical Narrative:    1:37 PM- CSW spoke with patient's sister regarding SNF choice. She and patient have selected [facility] due to its location. CSW left voicemail for admissions regarding bed availability.   3pm-[facility] is able to accept patient on Sunday if medically stable. Weekend CSW to contact the Careline for discharge: [phone]. CSW updated patient's sister.   Expected Discharge Plan: Skilled Nursing Facility Barriers to Discharge: Continued Medical Work up  Expected Discharge Plan and Services Expected Discharge Plan: Skilled Nursing Facility In-house Referral: Clinical Social Work Discharge Planning Services: CM Consult Post Acute Care Choice: Skilled Nursing Facility Living arrangements for the past 2 months: Single Family Home                           HH Arranged: NA           Social Determinants of Health (SDOH) Interventions    Readmission Risk Interventions No flowsheet data found.

## 2020-03-31 NOTE — Progress Notes (Signed)
PT Cancellation Note  Patient Details Name: Scotland Korver MRN: [id number] DOB: 1951-06-10   Cancelled Treatment:    Reason Eval/Treat Not Completed: Pain limiting ability to participate. Pt up in recliner, c/o headache and abdominal pain. RN notified, reports pt received Tylenol just prior to PT attempt.    Ilda Foil 03/31/2020, 11:03 AM   Aida Raider, PT  Office # [phone] Pager [phone]

## 2020-03-31 NOTE — Progress Notes (Signed)
   03/31/20 0014  Assess: MEWS Score  Temp 97.8 F (36.6 C)  BP 100/70  Pulse Rate (!) 101  ECG Heart Rate (!) 107  Resp 20  SpO2 100 %  Assess: MEWS Score  MEWS Temp 0  MEWS Systolic 1  MEWS Pulse 1  MEWS RR 0  MEWS LOC 0  MEWS Score 2  MEWS Score Color Yellow  Assess: if the MEWS score is Yellow or Red  Were vital signs taken at a resting state? No  Focused Assessment Change from prior assessment (see assessment flowsheet)  Early Detection of Sepsis Score *See Row Information* Low  MEWS guidelines implemented *See Row Information* Yes  Treat  Pain Scale 0-10  Pain Score 0  Take Vital Signs  Increase Vital Sign Frequency  Yellow: Q 2hr X 2 then Q 4hr X 2, if remains yellow, continue Q 4hrs  Escalate  MEWS: Escalate Yellow: discuss with charge nurse/RN and consider discussing with provider and RRT  Notify: Charge Nurse/RN  Name of Charge Nurse/RN Notified Nicole, RN  Date Charge Nurse/RN Notified 03/31/20  Time Charge Nurse/RN Notified [phone]

## 2020-03-31 NOTE — Progress Notes (Signed)
   03/31/20 0205  Assess: MEWS Score  Temp 98.8 F (37.1 C)  BP 104/83  Pulse Rate (!) 106  ECG Heart Rate (!) 107  Resp (!) 23  SpO2 96 %  Assess: MEWS Score  MEWS Temp 0  MEWS Systolic 0  MEWS Pulse 1  MEWS RR 1  MEWS LOC 0  MEWS Score 2  MEWS Score Color Yellow  Assess: if the MEWS score is Yellow or Red  Were vital signs taken at a resting state? Yes  Focused Assessment No change from prior assessment  Early Detection of Sepsis Score *See Row Information* Low  Treat  MEWS Interventions Escalated (See documentation below)  Pain Scale 0-10  Pain Score 0  Take Vital Signs  Increase Vital Sign Frequency  Yellow: Q 2hr X 2 then Q 4hr X 2, if remains yellow, continue Q 4hrs  Escalate  MEWS: Escalate Yellow: discuss with charge nurse/RN and consider discussing with provider and RRT  Notify: Charge Nurse/RN  Name of Charge Nurse/RN Notified Alder, RN  Date Charge Nurse/RN Notified 03/31/20  Time Charge Nurse/RN Notified 0220  Notify: Provider  Provider Name/Title Opyd, MD  Date Provider Notified 03/31/20  Time Provider Notified 0225  Notification Type Page  Notification Reason Change in status ([place] yellow, RR 30, HR 107 - 114)  Response See new orders  Date of Provider Response 03/31/20  Time of Provider Response [phone]

## 2020-03-31 NOTE — Plan of Care (Signed)
  Problem: Clinical Measurements: Goal: Will remain free from infection Outcome: Progressing Goal: Respiratory complications will improve Outcome: Progressing   Problem: Activity: Goal: Risk for activity intolerance will decrease Outcome: Progressing   Problem: Nutrition: Goal: Adequate nutrition will be maintained Outcome: Progressing   

## 2020-03-31 NOTE — Progress Notes (Signed)
PROGRESS NOTE  Tyler Jackson MCE:[id number] DOB: [birth year] DOA: 03/08/2020  PCP: Patient, No Pcp Per  Brief History/Interval Summary: 69 y.o. male with no significant past medical history presented to emergency department with worsening shortness of breath and cough since 1 week. He is not vaccinated against COVID-19.  He was noted to be tachycardic tachypneic and hypoxemic.  Requiring high flow nasal cannula.  COVID-19 test came back positive.  CT angiogram was negative for PE but showed extensive bilateral opacities.  Patient was hospitalized for further management.  Reason for Visit: Acute respiratory failure with hypoxia.  Pneumonia due to COVID-19  Consultants: GI  Procedures:   CT -  TTE - 1. Left ventricular ejection fraction, by estimation, is 60 to 65%. The left ventricle has normal function. The left ventricle has no regional wall motion abnormalities. There is moderate left ventricular hypertrophy. Left ventricular diastolic parameters are indeterminate.  2. Right ventricular systolic function is normal. The right ventricular size is normal. Tricuspid regurgitation signal is inadequate for assessing PA pressure.  3. The mitral valve is normal in structure. No evidence of mitral valve regurgitation. No evidence of mitral stenosis.  4. The aortic valve is tricuspid. Aortic valve regurgitation is not visualized. No aortic stenosis is present.  5. The inferior vena cava is normal in size with greater than 50% respiratory variability, suggesting right atrial pressure of 3 mmHg  Antibiotics: Anti-infectives (From admission, onward)   Start     Dose/Rate Route Frequency Ordered Stop   03/23/20 0830  levofloxacin (LEVAQUIN) tablet 750 mg  Status:  Discontinued        750 mg Oral Daily 03/23/20 0736 03/26/20 0714   03/09/20 1000  remdesivir 100 mg in sodium chloride 0.9 % 100 mL IVPB        100 mg 200 mL/hr over 30 Minutes Intravenous Daily 03/08/20 1338 03/12/20 0948   03/08/20 1645   cefTRIAXone (ROCEPHIN) 1 g in sodium chloride 0.9 % 100 mL IVPB        1 g 200 mL/hr over 30 Minutes Intravenous Every 24 hours 03/08/20 1640 03/12/20 1630   03/08/20 1645  azithromycin (ZITHROMAX) 500 mg in sodium chloride 0.9 % 250 mL IVPB        500 mg 250 mL/hr over 60 Minutes Intravenous Every 24 hours 03/08/20 1640 03/13/20 1644   03/08/20 1430  remdesivir 100 mg in sodium chloride 0.9 % 100 mL IVPB       "Followed by" Linked Group Details   100 mg 200 mL/hr over 30 Minutes Intravenous  Once 03/08/20 1338 03/08/20 1508   03/08/20 1400  remdesivir 100 mg in sodium chloride 0.9 % 100 mL IVPB       "Followed by" Linked Group Details   100 mg 200 mL/hr over 30 Minutes Intravenous  Once 03/08/20 1338 03/08/20 1434      Subjective/Interval history :  Patient reports dyspnea with cough at baseline, notable provement over the last 24 hours.    Assessment/Plan:  Acute Hypoxic Resp. Failure/Pneumonia due to COVID-19   - unfortunately he is unvaccinated and has incurred severe lung parenchymal injury from Covid PNA. -He was treated for total of 14 days of baricitinib . -Treated with IV steroids, has been tapered, last dose 10/20 . -treated with remdesivir . -Oxygen requirement has been improving, this morning he is on 2 L nasal cannula. Encouraged to sit up in chair use I-S and flutter valve for pulmonary toiletry and prone at night but patient is  resistant towards that despite multiple sessions of counseling, fortunately he is showing some signs of improvement although slow and gradual.   SpO2: 100 % O2 Flow Rate (L/min): 2 L/min FiO2 (%): 50 %   Recent Labs  Lab 03/25/20 0141 03/25/20 0141 03/26/20 0441 03/26/20 0441 03/26/20 2003 03/27/20 0339 03/28/20 0839 03/29/20 0135 03/30/20 0509 03/31/20 0301  WBC 20.6*   < > 17.5*   < >  --  15.8* 12.9* 13.1* 11.1* 9.8  CRP 0.5  --  0.6  --   --  0.5 2.7*  --   --   --   DDIMER 1.02*   < > 0.80*   < >  --  1.56* 1.86* 1.87*  1.77* 1.61*  BNP 49.2  --  74.3  --   --  70.6 54.5  --   --   --   PROCALCITON <0.10  --  <0.10  --   --  <0.10 <0.10  --   --   --   AST 55*   < > 40   < >  --  31 27 21 19 22   ALT 106*   < > 97*   < >  --  84* 75* 64* 52* 57*  ALKPHOS 61   < > 62   < >  --  59 68 65 61 64  BILITOT 0.4   < > 0.4   < >  --  1.0 0.5 0.4 0.4 0.2*  ALBUMIN 2.2*   < > 2.0*   < >  --  2.0* 2.0* 2.0* 2.1* 2.1*  INR  --   --   --   --  1.1  --   --   --   --   --    < > = values in this interval not displayed.     Acute blood loss anemia Due to GI bleed -Stools have been intermittently dark, BUN is stable, no GI symptoms, s/p 2 units of packed RBC on 03/26/2020 after which H&H is stable, Hemoccult positive. -GI consult greatly appreciated, given stable hemoglobin, no recurrent melena, and risks outweighed benefits for EGD at this point, will hold endoscopy to respiratory status more stable . -Patient resumed back on DVT prophylaxis dose of Lovenox given high risk for VTE.  High D-dimer.   - Due to inflammation, improved after high-dose Lovenox.  High dose Lovenox has been held due to GI bleed. -DVT prophylaxis dose Lovenox 10/20.  Hypokalemia -repleted.  Hypotension/sinus tachycardia -We will start on gentle hydration over the next 4 hours.  Thrombocytosis - Likely reactive due to COVID-19.  Improved.  Check periodically.  Leukocytosis - Secondary to steroids.  Stable.  Transaminitis - Secondary to COVID-19.  Resolved.  Mild diarrhea ongoing from 03/22/2020.  As needed Imodium and gentle hydration, stable TSH.    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 Incidentally noted on CT angiogram. Annual imaging is recommended, follow with vascular surgery post discharge.    Syncope and fall on 03/24/2020 due to orthostatic hypotension.  He had incurred some left leg injury, x-ray of left hip, tibia-fibula negative, no ongoing discomfort, echocardiogram stable, blood  pressure much better after IV fluids and blood transfusion, gradually advance activity and monitor.     DVT Prophylaxis: Lovenox  Code Status: Full code Family Communication:   Patient is awake, alert and appropriate, I have discussed at length with the patient Disposition Plan: Hopefully return home when improved  Status is: Inpatient  Remains inpatient appropriate because:IV treatments appropriate due to intensity of illness or inability to take PO and Inpatient level of care appropriate due to severity of illness   Dispo: The patient is from: Home              Anticipated d/c is to: SNF              Anticipated d/c date is: 2 days              Patient currently is not medically stable to d/c.  Remains tachycardic with low blood pressure, on IV fluids.    Medications:   Scheduled: . (feeding supplement) PROSource Plus  30 mL Oral BID BM  . vitamin C  500 mg Oral Daily  . clonazePAM  0.5 mg Oral BID  . enoxaparin (LOVENOX) injection  40 mg Subcutaneous Q24H  . feeding supplement  237 mL Oral BID BM  . guaiFENesin  600 mg Oral BID  . mouth rinse  15 mL Mouth Rinse BID  . pantoprazole  40 mg Oral BID  . zinc sulfate  220 mg Oral Daily   Continuous: . sodium chloride     ZOX:[id number]:acetaminophen, albuterol, guaiFENesin-dextromethorphan, loperamide, [DISCONTINUED] ondansetron **OR** ondansetron (ZOFRAN) IV, sodium chloride   Objective:  Vital Signs  Vitals:   03/31/20 0715 03/31/20 0752 03/31/20 0800 03/31/20 1153  BP:  102/76  (!) 89/73  Pulse:  (!) 102  (!) 112  Resp: 17 20  20   Temp:  99.3 F (37.4 C) 99.3 F (37.4 C) 98.9 F (37.2 C)  TempSrc:  Oral  Oral  SpO2:  99%  100%  Weight:      Height:        Intake/Output Summary (Last 24 hours) at 03/31/2020 1334 Last data filed at 03/31/2020 1332 Gross per 24 hour  Intake 960 ml  Output 600 ml  Net 360 ml   Filed Weights   03/08/20 1111 03/09/20 1006 03/14/20 0434  Weight: 101.6 kg 101.6 kg 93.1 kg     Exam  Awake Alert, Oriented X 3, frail, no new F.N deficits, Normal affect Symmetrical Chest wall movement, Good air movement bilaterally, CTAB Tachycardic,No Gallops,Rubs or new Murmurs, No Parasternal Heave +ve B.Sounds, Abd Soft, No tenderness, No rebound - guarding or rigidity. No Cyanosis, Clubbing or edema, No new Rash or bruise    Lab Results:  Data Reviewed: I have personally reviewed following labs and imaging studies  CBC: Recent Labs  Lab 03/25/20 0141 03/25/20 0141 03/26/20 0441 03/26/20 2003 03/27/20 0339 03/28/20 0839 03/29/20 0135 03/30/20 0509 03/31/20 0301  WBC 20.6*   < > 17.5*  --  15.8* 12.9* 13.1* 11.1* 9.8  NEUTROABS 18.3*  --  14.4*  --  11.9* 10.6*  --   --   --   HGB 7.4*   < > 6.4*   < > 9.2* 9.8* 9.8* 9.9* 10.5*  HCT 23.2*   < > 20.0*   < > 27.6* 29.7* 30.2* 30.9* 33.2*  MCV 96.7   < > 97.1  --  95.8 99.0 99.7 98.7 99.7  PLT 386   < > 348  --  328 317 318 273 310   < > = values in this interval not displayed.    Basic Metabolic Panel: Recent Labs  Lab 03/25/20 0141 03/25/20 0141 03/26/20 0441 03/26/20 0441 03/27/20 0339 03/28/20 0839 03/29/20 0135 03/30/20 0509 03/31/20 0301  NA 134*   < > 132*   < >  134* 137 135 135 137  K 4.3   < > 4.4   < > 3.7 3.8 3.6 3.2* 4.1  CL 99   < > 100   < > 101 104 103 101 102  CO2 28   < > 27   < > 25 26 25 25 29   GLUCOSE 194*   < > 163*   < > 108* 121* 143* 168* 133*  BUN 19   < > 16   < > 11 10 10 9 10   CREATININE 0.82   < > 0.73   < > 0.62 0.63 0.66 0.65 0.65  CALCIUM 8.2*   < > 8.0*   < > 8.0* 7.8* 7.6* 7.8* 8.0*  MG 2.0  --  1.9  --  1.9 2.1  --   --   --    < > = values in this interval not displayed.    GFR: Estimated Creatinine Clearance: 104.4 mL/min (by C-G formula based on SCr of 0.65 mg/dL).  Liver Function Tests: Recent Labs  Lab 03/27/20 0339 03/28/20 0839 03/29/20 0135 03/30/20 0509 03/31/20 0301  AST 31 27 21 19 22   ALT 84* 75* 64* 52* 57*  ALKPHOS 59 68 65 61 64   BILITOT 1.0 0.5 0.4 0.4 0.2*  PROT 4.6* 4.7* 4.5* 5.0* 4.6*  ALBUMIN 2.0* 2.0* 2.0* 2.1* 2.1*   Lab Results  Component Value Date   TSH 0.755 03/23/2020     No results found for this or any previous visit (from the past 240 hour(s)).    Radiology Studies: No results found.   LOS: 22 days   04/02/20 on [email or web address]  03/31/2020, 1:34 PM

## 2020-04-01 LAB — COMPREHENSIVE METABOLIC PANEL
ALT: 48 U/L — ABNORMAL HIGH (ref 0–44)
AST: 20 U/L (ref 15–41)
Albumin: 2.1 g/dL — ABNORMAL LOW (ref 3.5–5.0)
Alkaline Phosphatase: 54 U/L (ref 38–126)
Anion gap: 6 (ref 5–15)
BUN: 8 mg/dL (ref 8–23)
CO2: 26 mmol/L (ref 22–32)
Calcium: 8 mg/dL — ABNORMAL LOW (ref 8.9–10.3)
Chloride: 104 mmol/L (ref 98–111)
Creatinine, Ser: 0.65 mg/dL (ref 0.61–1.24)
GFR, Estimated: >60 mL/min (ref >60)
Glucose, Bld: 121 mg/dL — ABNORMAL HIGH (ref 70–99)
Potassium: 3.8 mmol/L (ref 3.5–5.1)
Sodium: 136 mmol/L (ref 135–145)
Total Bilirubin: 0.7 mg/dL (ref 0.3–1.2)
Total Protein: 5 g/dL — ABNORMAL LOW (ref 6.5–8.1)

## 2020-04-01 LAB — CBC
HCT: 31.6 % — ABNORMAL LOW (ref 39.0–52.0)
Hemoglobin: 10 g/dL — ABNORMAL LOW (ref 13.0–17.0)
MCH: 31.6 pg (ref 26.0–34.0)
MCHC: 31.6 g/dL (ref 30.0–36.0)
MCV: 100 fL (ref 80.0–100.0)
Platelets: 275 10*3/uL (ref 150–400)
RBC: 3.16 MIL/uL — ABNORMAL LOW (ref 4.22–5.81)
RDW: 16.1 % — ABNORMAL HIGH (ref 11.5–15.5)
WBC: 8.7 10*3/uL (ref 4.0–10.5)
nRBC: 0 % (ref 0.0–0.2)

## 2020-04-01 LAB — D-DIMER, QUANTITATIVE: D-Dimer, Quant: 1.53 ug/mL-FEU — ABNORMAL HIGH (ref 0.00–0.50)

## 2020-04-01 MED ORDER — SODIUM CHLORIDE 0.9 % IV SOLN: INTRAVENOUS | Status: AC

## 2020-04-01 MED ORDER — HYDROCORTISONE NA SUCCINATE PF 100 MG IJ SOLR
100.0000 mg | Freq: Once | INTRAMUSCULAR | Status: AC
  Administered 2020-04-01: 100 mg via INTRAVENOUS
  Filled 2020-04-01: qty 2

## 2020-04-01 NOTE — Progress Notes (Signed)
Pt out of Covid infectious window/ 21 days. Pt transported with all his belongings to 2W06 in stable condition. Report given to receiving nurse.

## 2020-04-01 NOTE — Progress Notes (Signed)
PROGRESS NOTE  Tyler Jackson MVE:[id number] DOB: Sep 09, 1950 DOA: 03/08/2020  PCP: Patient, No Pcp Per  Brief History/Interval Summary: 69 y.o. male with no significant past medical history presented to emergency department with worsening shortness of breath and cough since 1 week. He is not vaccinated against COVID-19.  He was noted to be tachycardic tachypneic and hypoxemic.  Requiring high flow nasal cannula.  COVID-19 test came back positive.  CT angiogram was negative for PE but showed extensive bilateral opacities.  Patient was hospitalized for further management.  Reason for Visit: Acute respiratory failure with hypoxia.  Pneumonia due to COVID-19  Consultants: GI  Procedures:   CT -  TTE - 1. Left ventricular ejection fraction, by estimation, is 60 to 65%. The left ventricle has normal function. The left ventricle has no regional wall motion abnormalities. There is moderate left ventricular hypertrophy. Left ventricular diastolic parameters are indeterminate.  2. Right ventricular systolic function is normal. The right ventricular size is normal. Tricuspid regurgitation signal is inadequate for assessing PA pressure.  3. The mitral valve is normal in structure. No evidence of mitral valve regurgitation. No evidence of mitral stenosis.  4. The aortic valve is tricuspid. Aortic valve regurgitation is not visualized. No aortic stenosis is present.  5. The inferior vena cava is normal in size with greater than 50% respiratory variability, suggesting right atrial pressure of 3 mmHg  Antibiotics: Anti-infectives (From admission, onward)   Start     Dose/Rate Route Frequency Ordered Stop   03/23/20 0830  levofloxacin (LEVAQUIN) tablet 750 mg  Status:  Discontinued        750 mg Oral Daily 03/23/20 0736 03/26/20 0714   03/09/20 1000  remdesivir 100 mg in sodium chloride 0.9 % 100 mL IVPB        100 mg 200 mL/hr over 30 Minutes Intravenous Daily 03/08/20 1338 03/12/20 0948   03/08/20 1645   cefTRIAXone (ROCEPHIN) 1 g in sodium chloride 0.9 % 100 mL IVPB        1 g 200 mL/hr over 30 Minutes Intravenous Every 24 hours 03/08/20 1640 03/12/20 1630   03/08/20 1645  azithromycin (ZITHROMAX) 500 mg in sodium chloride 0.9 % 250 mL IVPB        500 mg 250 mL/hr over 60 Minutes Intravenous Every 24 hours 03/08/20 1640 03/13/20 1644   03/08/20 1430  remdesivir 100 mg in sodium chloride 0.9 % 100 mL IVPB       "Followed by" Linked Group Details   100 mg 200 mL/hr over 30 Minutes Intravenous  Once 03/08/20 1338 03/08/20 1508   03/08/20 1400  remdesivir 100 mg in sodium chloride 0.9 % 100 mL IVPB       "Followed by" Linked Group Details   100 mg 200 mL/hr over 30 Minutes Intravenous  Once 03/08/20 1338 03/08/20 1434      Subjective/Interval history :  Patient denies any further melena, light brown bowel movement today, reports dyspnea and cough has significantly improved.   Assessment/Plan:  Acute Hypoxic Resp. Failure/Pneumonia due to COVID-19   - unfortunately he is unvaccinated and has incurred severe lung parenchymal injury from Covid PNA. -He was treated for total of 14 days of baricitinib . -Treated with IV steroids, has been tapered, last dose 10/20 . -treated with remdesivir . -Oxygen requirement has been improving, he remains on 2 L [place]. Encouraged to sit up in chair use I-S and flutter valve for pulmonary toiletry and prone at night but patient is resistant  towards that despite multiple sessions of counseling, fortunately he is showing some signs of improvement although slow and gradual.   SpO2: 100 % O2 Flow Rate (L/min): 2 L/min FiO2 (%): 50 %   Recent Labs  Lab 03/26/20 0441 03/26/20 0441 03/26/20 2003 03/27/20 0339 03/27/20 0339 03/28/20 0839 03/29/20 0135 03/30/20 0509 03/31/20 0301 04/01/20 0308  WBC 17.5*   < >  --  15.8*   < > 12.9* 13.1* 11.1* 9.8 8.7  CRP 0.6  --   --  0.5  --  2.7*  --   --   --   --   DDIMER 0.80*   < >  --  1.56*   < > 1.86*  1.87* 1.77* 1.61* 1.53*  BNP 74.3  --   --  70.6  --  54.5  --   --   --   --   PROCALCITON <0.10  --   --  <0.10  --  <0.10  --   --   --   --   AST 40   < >  --  31   < > 27 21 19 22 20   ALT 97*   < >  --  84*   < > 75* 64* 52* 57* 48*  ALKPHOS 62   < >  --  59   < > 68 65 61 64 54  BILITOT 0.4   < >  --  1.0   < > 0.5 0.4 0.4 0.2* 0.7  ALBUMIN 2.0*   < >  --  2.0*   < > 2.0* 2.0* 2.1* 2.1* 2.1*  INR  --   --  1.1  --   --   --   --   --   --   --    < > = values in this interval not displayed.     Acute blood loss anemia Due to GI bleed -Stools have been intermittently dark, BUN is stable, no GI symptoms, s/p 2 units of packed RBC on 03/26/2020 after which H&H is stable, Hemoccult positive. -GI consult greatly appreciated, given stable hemoglobin, no recurrent melena, and risks outweighed benefits for EGD at this point, will hold endoscopy to respiratory status more stable . -Patient resumed back on DVT prophylaxis dose of Lovenox given high risk for VTE.  High D-dimer.   - Due to inflammation, improved after high-dose Lovenox.  High dose Lovenox has been held due to GI bleed. -DVT prophylaxis dose Lovenox 10/20.  Hypokalemia -repleted.  Sinus tachycardia/soft blood pressure -He will be started on gentle hydration.  Thrombocytosis - Likely reactive due to COVID-19.  Improved.  Check periodically.  Leukocytosis - Secondary to steroids.  Stable.  Transaminitis - Secondary to COVID-19.  Resolved.  Mild diarrhea ongoing from 03/22/2020.  As needed Imodium and gentle hydration, stable TSH.    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 Incidentally noted on CT angiogram. Annual imaging is recommended, follow with vascular surgery post discharge.    Syncope and fall on 03/24/2020 due to orthostatic hypotension.   He had incurred some left leg injury, x-ray of left hip, tibia-fibula negative, no ongoing discomfort, echocardiogram stable, blood  pressure much better after IV fluids and blood transfusion, gradually advance activity and monitor.     DVT Prophylaxis: Lovenox  Code Status: Full code Family Communication:   Patient is awake, alert and appropriate, I have discussed at length with the patient Disposition Plan: Hopefully  return home when improved  Status is: Inpatient  Remains inpatient appropriate because:IV treatments appropriate due to intensity of illness or inability to take PO and Inpatient level of care appropriate due to severity of illness   Dispo: The patient is from: Home              Anticipated d/c is to: SNF              Anticipated d/c date is: 2 days              Patient currently is not medically stable to d/c.  Remains tachycardic with low blood pressure, on IV fluids.    Medications:   Scheduled: . (feeding supplement) PROSource Plus  30 mL Oral BID BM  . vitamin C  500 mg Oral Daily  . clonazePAM  0.5 mg Oral BID  . enoxaparin (LOVENOX) injection  40 mg Subcutaneous Q24H  . feeding supplement  237 mL Oral BID BM  . guaiFENesin  600 mg Oral BID  . mouth rinse  15 mL Mouth Rinse BID  . pantoprazole  40 mg Oral BID  . zinc sulfate  220 mg Oral Daily   Continuous:  QIO:[id number], albuterol, guaiFENesin-dextromethorphan, loperamide, [DISCONTINUED] ondansetron **OR** ondansetron (ZOFRAN) IV, sodium chloride   Objective:  Vital Signs  Vitals:   04/01/20 0002 04/01/20 0205 04/01/20 0741 04/01/20 1313  BP: 99/70 99/73 97/64  98/72  Pulse: (!) 108 (!) 107 (!) 101 100  Resp: (!) 23 (!) 23 (!) 23 20  Temp: 98.1 F (36.7 C) 98 F (36.7 C) 98.2 F (36.8 C) 98.1 F (36.7 C)  TempSrc: Oral Oral Axillary Axillary  SpO2: 99% 94% 100% 100%  Weight:      Height:        Intake/Output Summary (Last 24 hours) at 04/01/2020 1413 Last data filed at 04/01/2020 1255 Gross per 24 hour  Intake 873.08 ml  Output 1000 ml  Net -126.92 ml   Filed Weights   03/08/20 1111 03/09/20 1006  03/14/20 0434  Weight: 101.6 kg 101.6 kg 93.1 kg    Exam  Awake Alert, Oriented X 3, No new F.N deficits, Normal affect Symmetrical Chest wall movement, Good air movement bilaterally, CTAB Tachycardiac ,No Gallops,Rubs or new Murmurs, No Parasternal Heave +ve B.Sounds, Abd Soft, No tenderness, No rebound - guarding or rigidity. No Cyanosis, Clubbing or edema, No new Rash or bruise     Lab Results:  Data Reviewed: I have personally reviewed following labs and imaging studies  CBC: Recent Labs  Lab 03/26/20 0441 03/26/20 2003 03/27/20 0339 03/27/20 0339 03/28/20 0839 03/29/20 0135 03/30/20 0509 03/31/20 0301 04/01/20 0308  WBC 17.5*  --  15.8*   < > 12.9* 13.1* 11.1* 9.8 8.7  NEUTROABS 14.4*  --  11.9*  --  10.6*  --   --   --   --   HGB 6.4*   < > 9.2*   < > 9.8* 9.8* 9.9* 10.5* 10.0*  HCT 20.0*   < > 27.6*   < > 29.7* 30.2* 30.9* 33.2* 31.6*  MCV 97.1  --  95.8   < > 99.0 99.7 98.7 99.7 100.0  PLT 348  --  328   < > 317 318 273 310 275   < > = values in this interval not displayed.    Basic Metabolic Panel: Recent Labs  Lab 03/26/20 0441 03/26/20 0441 03/27/20 03/29/20 03/27/20 0339 03/28/20 0839 03/29/20 0135 03/30/20 0509 03/31/20 0301 04/01/20 0308  NA  132*   < > 134*   < > 137 135 135 137 136  K 4.4   < > 3.7   < > 3.8 3.6 3.2* 4.1 3.8  CL 100   < > 101   < > 104 103 101 102 104  CO2 27   < > 25   < > 26 25 25 29 26   GLUCOSE 163*   < > 108*   < > 121* 143* 168* 133* 121*  BUN 16   < > 11   < > 10 10 9 10 8   CREATININE 0.73   < > 0.62   < > 0.63 0.66 0.65 0.65 0.65  CALCIUM 8.0*   < > 8.0*   < > 7.8* 7.6* 7.8* 8.0* 8.0*  MG 1.9  --  1.9  --  2.1  --   --   --   --    < > = values in this interval not displayed.    GFR: Estimated Creatinine Clearance: 104.4 mL/min (by C-G formula based on SCr of 0.65 mg/dL).  Liver Function Tests: Recent Labs  Lab 03/28/20 0839 03/29/20 0135 03/30/20 0509 03/31/20 0301 04/01/20 0308  AST 27 21 19 22 20   ALT 75*  64* 52* 57* 48*  ALKPHOS 68 65 61 64 54  BILITOT 0.5 0.4 0.4 0.2* 0.7  PROT 4.7* 4.5* 5.0* 4.6* 5.0*  ALBUMIN 2.0* 2.0* 2.1* 2.1* 2.1*   Lab Results  Component Value Date   TSH 0.755 03/23/2020     No results found for this or any previous visit (from the past 240 hour(s)).    Radiology Studies: No results found.   LOS: 23 days   Merchant navy officerDawood Apollo Timothy  [facility] Pager on [email or web address]  04/01/2020, 2:13 PM

## 2020-04-02 DIAGNOSIS — R748 Abnormal levels of other serum enzymes: Secondary | ICD-10-CM

## 2020-04-02 LAB — CBC
HCT: 30.6 % — ABNORMAL LOW (ref 39.0–52.0)
Hemoglobin: 9.7 g/dL — ABNORMAL LOW (ref 13.0–17.0)
MCH: 31.6 pg (ref 26.0–34.0)
MCHC: 31.7 g/dL (ref 30.0–36.0)
MCV: 99.7 fL (ref 80.0–100.0)
Platelets: 249 10*3/uL (ref 150–400)
RBC: 3.07 MIL/uL — ABNORMAL LOW (ref 4.22–5.81)
RDW: 16.1 % — ABNORMAL HIGH (ref 11.5–15.5)
WBC: 8.9 10*3/uL (ref 4.0–10.5)
nRBC: 0 % (ref 0.0–0.2)

## 2020-04-02 LAB — COMPREHENSIVE METABOLIC PANEL
ALT: 48 U/L — ABNORMAL HIGH (ref 0–44)
AST: 22 U/L (ref 15–41)
Albumin: 2.1 g/dL — ABNORMAL LOW (ref 3.5–5.0)
Alkaline Phosphatase: 55 U/L (ref 38–126)
Anion gap: 6 (ref 5–15)
BUN: 5 mg/dL — ABNORMAL LOW (ref 8–23)
CO2: 27 mmol/L (ref 22–32)
Calcium: 8.1 mg/dL — ABNORMAL LOW (ref 8.9–10.3)
Chloride: 105 mmol/L (ref 98–111)
Creatinine, Ser: 0.61 mg/dL (ref 0.61–1.24)
GFR, Estimated: >60 mL/min (ref >60)
Glucose, Bld: 113 mg/dL — ABNORMAL HIGH (ref 70–99)
Potassium: 3.7 mmol/L (ref 3.5–5.1)
Sodium: 138 mmol/L (ref 135–145)
Total Bilirubin: 0.4 mg/dL (ref 0.3–1.2)
Total Protein: 5.1 g/dL — ABNORMAL LOW (ref 6.5–8.1)

## 2020-04-02 LAB — SARS CORONAVIRUS 2 BY RT PCR (HOSPITAL ORDER, PERFORMED IN ~~LOC~~ HOSPITAL LAB): SARS Coronavirus 2: NEGATIVE

## 2020-04-02 LAB — D-DIMER, QUANTITATIVE: D-Dimer, Quant: 1.33 ug/mL-FEU — ABNORMAL HIGH (ref 0.00–0.50)

## 2020-04-02 MED ORDER — PANTOPRAZOLE SODIUM 40 MG PO TBEC: 40.0000 mg | DELAYED_RELEASE_TABLET | Freq: Two times a day (BID) | ORAL | Status: AC

## 2020-04-02 MED ORDER — CLONAZEPAM 0.5 MG PO TABS: 0.2500 mg | ORAL_TABLET | Freq: Two times a day (BID) | ORAL | 0 refills | Status: AC

## 2020-04-02 MED ORDER — ENSURE ENLIVE PO LIQD: 237.0000 mL | Freq: Two times a day (BID) | ORAL | 12 refills | Status: AC

## 2020-04-02 MED ORDER — GUAIFENESIN ER 600 MG PO TB12: 600.0000 mg | ORAL_TABLET | Freq: Two times a day (BID) | ORAL | Status: AC | PRN

## 2020-04-02 MED ORDER — PROSOURCE PLUS PO LIQD: 30.0000 mL | Freq: Two times a day (BID) | ORAL | Status: AC

## 2020-04-02 MED ORDER — ACETAMINOPHEN 325 MG PO TABS: 650.0000 mg | ORAL_TABLET | Freq: Four times a day (QID) | ORAL | Status: AC | PRN

## 2020-04-02 MED ORDER — ALBUTEROL SULFATE HFA 108 (90 BASE) MCG/ACT IN AERS: 2.0000 | INHALATION_SPRAY | Freq: Four times a day (QID) | RESPIRATORY_TRACT | Status: AC | PRN

## 2020-04-02 NOTE — Progress Notes (Signed)
Pts sister Eber Jones notified of Pts transfer  Facility given report for transfer

## 2020-04-02 NOTE — Discharge Summary (Signed)
Tyler Jackson, is a 69 y.o. male  DOB 23-Jul-1950  MRN [id number].  Admission date:  03/08/2020  Admitting Physician  Ollen Bowl, MD  Discharge Date:  04/02/2020   Primary MD  Patient, No Pcp Per  Recommendations for primary care physician for things to follow:  -Please check CBC, CMP during next visit. -Is on oxygen 2 L nasal cannula at rest, adjust as needed, please keep encouraging to use incentive spirometer. -Patient will need annual follow-up/imaging for finding of fusiform aneurysmal dilation of ascending aorta.   Admission Diagnosis  SOB (shortness of breath) [R06.02] Acute respiratory failure with hypoxia (HCC) [J96.01] Acute hypoxemic respiratory failure due to COVID-19 (HCC) [U07.1, J96.01] COVID-19 [U07.1]   Discharge Diagnosis  SOB (shortness of breath) [R06.02] Acute respiratory failure with hypoxia (HCC) [J96.01] Acute hypoxemic respiratory failure due to COVID-19 (HCC) [U07.1, J96.01] COVID-19 [U07.1]   Principal Problem:   Acute hypoxemic respiratory failure due to COVID-19 [facility]) Active Problems:   Thrombocytosis   Elevated liver enzymes      History reviewed. No pertinent past medical history.  History reviewed. No pertinent surgical history.     History of present illness and  Hospital Course:     Kindly see H&P for history of present illness and admission details, please review complete Labs, Consult reports and Test reports for all details in brief  HPI  from the history and physical done on the day of admission 03/08/2020   HPI: Tyler Jackson is a 69 y.o. male with no significant past medical history presents to emergency department with worsening shortness of breath and cough since 1 week.  Patient reports worsening shortness of breath, sore throat, cough, generalized weakness, no energy, body ache decreased appetite since 1 week however his symptoms started  getting worse since past couple of days. He is not vaccinated against COVID-19.  He denies headache, blurry vision, chest pain, wheezing, leg swelling, palpitation, orthopnea, PND, fever, chills, nausea, vomiting, diarrhea, urinary symptoms.  No history of smoking, illicit drug use however drinks alcohol occasionally. He does not have any past medical history and does not take any medications at home.  ED Course: Upon arrival to ED: Patient tachycardic, tachypneic, hypoxemic requiring high flow nasal cannula-15 L, afebrile with leukocytosis of 18.2, platelet: 509, lactic acid: WNL, troponin x2 -, BMP shows sodium of 133, AST: 55, ALT: 75, D-dimer: 8.7, CRP: 30.9, PCT: 0.19, elevated all other inflammatory markers. COVID-19 positive. CT angio of chest came back negative for PE but shows: Extensive bilateral and predominantly. Afebrile and basilar region of patchy groundglass attenuation airspace opacity with more consolidative changes in lower lobes-consistent with atypical/viral multifocal pneumonia such as COVID-19. 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 Patient received Rocephin, azithromycin, Solu-Medrol, remdesivir and baricitinib at [facility] and transferred to [facility] for further evaluation and management of acute hypoxemic respiratory failure secondary to COVID-19 pneumonia.  Hospital Course   Acute Hypoxic Resp. Failure/Pneumonia due to COVID-19   -  unfortunately he is unvaccinated and has incurred severe lung parenchymal injury from Covid PNA. -He was treated for total of 14 days of baricitinib . -Treated with IV steroids, has been tapered, last dose 10/20 . -treated with remdesivir . -Oxygen requirement has been improving, he remains on 2 L [place].  Continue to wean as tolerated, he was encouraged with incentive spirometer.   Acute blood loss anemia Due to GI bleed -Stools have been intermittently dark, BUN  is stable, no GI symptoms, s/p 2 units of packed RBC on 03/26/2020 after which H&H is stable, Hemoccult positive. -GI consult greatly appreciated, given stable hemoglobin, no recurrent melena, and risks outweighed benefits for EGD at this point, decision has been made to hold on endoscopy. -Patient resumed back on DVT prophylaxis dose, no further melena for last 3 days, hemoglobin has been stable -Continue with the Protonix 40 mg oral twice daily for next 8 weeks, then change to once daily. Follow with GI as an outpatient..  High D-dimer.   - Due to inflammation, improved after high-dose Lovenox.  High dose Lovenox has been held due to GI bleed.  Was kept on DVT prophylaxis dose, D-dimers trending down, most recent 1.3 on day of discharge, he had negative venous Dopplers x2, and CTA chest negative for PE on admission 9/29.  Hypokalemia -repleted.  Sinus tachycardia/soft blood pressure -Due to volume depletion and dehydration, this has resolved with IV fluids.  Thrombocytosis - Likely reactive due to COVID-19.  Improved.    Leukocytosis - Secondary to steroids.  Stable.  Transaminitis - Secondary to COVID-19.  Resolved.  Mild diarrhea ongoing from 03/22/2020.  As needed Imodium and gentle hydration, stable TSH.    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 Incidentally noted on CT angiogram. Annual imaging is recommended, follow with vascular surgery post discharge.   -This has been communicated with the patient, he understands the importance of the follow-up.  Syncope and fall on 03/24/2020 due to orthostatic hypotension.   He had incurred some left leg injury, x-ray of left hip, tibia-fibula negative, no ongoing discomfort, echocardiogram stable, blood pressure much better after IV fluids and blood transfusion, gradually advance activity and monitor.     Discharge Condition:  Stable  Follow UP   Follow-up Information     [facility] [place] Follow up on 03/28/2020.   Specialty: Family Medicine Why: Appointment made for COVID clinic on 04/05/20 at 3:00 pm.  YOU MUST ATTEND THIS APPOINTMENT. Contact information: [address] [place] [address] [phone]       [facility] Follow up.   Why: COVID clinic to make appointment for follow up to PCP and pharmacy to pick up meds at Contact information: [address] [address] [phone]                Discharge Instructions  and  Discharge Medications    Discharge Instructions    Discharge instructions   Complete by: As directed    Follow with SNF physician  Get CBC, CMP,  In 3 days  Activity: As tolerated with Full fall precautions use walker/cane & assistance as needed   Disposition SNF   Diet: Regular diet, with feeding assistance and aspiration precautions.   On your next visit with your primary care physician please Get Medicines reviewed and adjusted.   Please request your Prim.MD to go over all Hospital Tests and Procedure/Radiological  results at the follow up, please get all Hospital records sent to your Prim MD by signing hospital release before you go home.   If you experience worsening of your admission symptoms, develop shortness of breath, life threatening emergency, suicidal or homicidal thoughts you must seek medical attention immediately by calling 911 or calling your MD immediately  if symptoms less severe.  You Must read complete instructions/literature along with all the possible adverse reactions/side effects for all the Medicines you take and that have been prescribed to you. Take any new Medicines after you have completely understood and accpet all the possible adverse reactions/side effects.   Do not drive, operating heavy machinery, perform activities at heights, swimming or participation in water activities or  provide baby sitting services if your were admitted for syncope or siezures until you have seen by Primary MD or a Neurologist and advised to do so again.  Do not drive when taking Pain medications.    Do not take more than prescribed Pain, Sleep and Anxiety Medications  Special Instructions: If you have smoked or chewed Tobacco  in the last 2 yrs please stop smoking, stop any regular Alcohol  and or any Recreational drug use.  Wear Seat belts while driving.   Please note  You were cared for by a hospitalist during your hospital stay. If you have any questions about your discharge medications or the care you received while you were in the hospital after you are discharged, you can call the unit and asked to speak with the hospitalist on call if the hospitalist that took care of you is not available. Once you are discharged, your primary care physician will handle any further medical issues. Please note that NO REFILLS for any discharge medications will be authorized once you are discharged, as it is imperative that you return to your primary care physician (or establish a relationship with a primary care physician if you do not have one) for your aftercare needs so that they can reassess your need for medications and monitor your lab values.   Increase activity slowly   Complete by: As directed      Allergies as of 04/02/2020   No Known Allergies     Medication List    TAKE these medications   (feeding supplement) PROSource Plus liquid Take 30 mLs by mouth 2 (two) times daily between meals.   feeding supplement Liqd Take 237 mLs by mouth 2 (two) times daily between meals.   acetaminophen 325 MG tablet Commonly known as: TYLENOL Take 2 tablets (650 mg total) by mouth every 6 (six) hours as needed for mild pain or headache (fever >/= 101). What changed:   medication strength  how much to take  reasons to take this   albuterol 108 (90 Base) MCG/ACT inhaler Commonly known as:  VENTOLIN HFA Inhale 2 puffs into the lungs every 6 (six) hours as needed for wheezing or shortness of breath.   clonazePAM 0.5 MG tablet Commonly known as: KLONOPIN Take 0.5 tablets (0.25 mg total) by mouth 2 (two) times daily.   guaiFENesin 600 MG 12 hr tablet Commonly known as: MUCINEX Take 1 tablet (600 mg total) by mouth 2 (two) times daily as needed for cough or to loosen phlegm.   pantoprazole 40 MG tablet Commonly known as: PROTONIX Take 1 tablet (40 mg total) by mouth 2 (two) times daily.         Diet and Activity recommendation: See Discharge Instructions above   Consults obtained -  GI  Major procedures and Radiology Reports - PLEASE review detailed and final reports for all details, in brief -      CT Angio Chest PE W and/or Wo Contrast  Result Date: 03/08/2020 CLINICAL DATA:  Shortness of breath, cold like symptoms for the past week, chest pain, hypoxia. Concern for COVID and pulmonary embolus. EXAM: CT ANGIOGRAPHY CHEST WITH CONTRAST TECHNIQUE: Multidetector CT imaging of the chest was performed using the standard protocol during bolus administration of intravenous contrast. Multiplanar CT image reconstructions and MIPs were obtained to evaluate the vascular anatomy. CONTRAST:  OMNIPAQUE IOHEXOL 350 MG/ML SOLN COMPARISON:  None. FINDINGS: Cardiovascular: Satisfactory opacification of the pulmonary arteries to the segmental level. No evidence of pulmonary embolism. Normal heart size. No pericardial effusion. Mild fusiform dilation of the tubular portion of the ascending thoracic aorta with a maximal transverse diameter of 4.4 cm. Calcifications visualized along the coronary arteries. No pericardial effusion. Mediastinum/Nodes: No enlarged mediastinal, hilar, or axillary lymph nodes. Thyroid gland, trachea, and esophagus demonstrate no significant findings. Lungs/Pleura: Predominantly peripheral geographic regions of ground-glass attenuation airspace opacity with  more confluent opacities in the bilateral lower lobes. Peripheral opacities are present in all lobes of both lungs. No interlobular septal thickening to suggest pulmonary edema. No pneumothorax or pleural effusion. Upper Abdomen: No acute abnormality. Musculoskeletal: No acute fracture or aggressive appearing lytic or blastic osseous lesion. Review of the MIP images confirms the above findings. IMPRESSION: 1. Negative for acute pulmonary embolus. 2. Extensive bilateral and predominantly peripheral and basilar regions of patchy ground-glass attenuation airspace opacity with more consolidative changes in the lower lobes. Overall, findings are most consistent with acute atypical/viral multifocal pneumonia such as COVID pneumonia. 3. Fusiform aneurysmal dilation of the ascending thoracic aorta with a maximal transverse diameter of 4.4 cm. Recommend annual imaging followup by CTA or MRA. This recommendation follows 2010 ACCF/AHA/AATS/ACR/ASA/SCA/SCAI/SIR/STS/SVM Guidelines for the Diagnosis and Management of Patients with Thoracic Aortic Disease. Circulation. 2010; 121: W967-R916. Aortic aneurysm NOS (ICD10-I71.9); Aortic Atherosclerosis (ICD10-I70.0). 4. Coronary artery calcifications. Electronically Signed   By: Malachy Moan M.D.   On: 03/08/2020 16:11   CT ABDOMEN PELVIS W CONTRAST  Result Date: 03/26/2020 CLINICAL DATA:  Anemia, lower GI bleed EXAM: CT ABDOMEN AND PELVIS WITH CONTRAST TECHNIQUE: Multidetector CT imaging of the abdomen and pelvis was performed using the standard protocol following bolus administration of intravenous contrast. CONTRAST:  OMNIPAQUE IOHEXOL 300 MG/ML  SOLN COMPARISON:  Partial comparison to CTA chest dated 03/08/2020 FINDINGS: Lower chest: Subpleural patchy opacities in the bilateral lower lobes, compatible with COVID pneumonia and/or post infectious/inflammatory fibrosis. Hepatobiliary: Liver is within normal limits. Gallbladder is unremarkable. No intrahepatic or  extrahepatic ductal dilatation. Pancreas: Within normal limits. Spleen: Within normal limits. Adrenals/Urinary Tract: Adrenal glands are within normal limits. 6 mm cyst in the posterior right upper kidney (series 3/image 34). Left kidney is within normal limits. No hydronephrosis. Thick-walled bladder, although underdistended. Stomach/Bowel: Stomach is within normal limits. No evidence of bowel obstruction. Normal appendix (series 3/image 64). No colonic wall thickening, mass, or inflammatory changes in this patient with suspected lower GI bleed. Vascular/Lymphatic: No evidence of abdominal aortic aneurysm. No suspicious abdominopelvic lymphadenopathy. Reproductive: Prostate is unremarkable. Other: No abdominopelvic ascites. Musculoskeletal: Degenerative changes of the visualized thoracolumbar spine. IMPRESSION: No colonic wall thickening, mass, or inflammatory changes in this patient with suspected lower GI bleed. Subpleural patchy opacities in the bilateral lower lobes, compatible with COVID pneumonia and/or post infectious/inflammatory fibrosis. Electronically Signed   By: Lurlean Horns  Rito Ehrlich M.D.   On: 03/26/2020 11:15   DG Chest Port 1 View  Result Date: 03/28/2020 CLINICAL DATA:  Respiratory failure due to COVID-19. EXAM: PORTABLE CHEST 1 VIEW COMPARISON:  03/23/2020. FINDINGS: Mediastinum and hilar structures stable. Heart size stable. Diffuse bilateral pulmonary infiltrates again noted without interim change. Low lung volumes. No pleural effusion or pneumothorax. IMPRESSION: Diffuse bilateral pulmonary infiltrates again noted without interim change. Low lung volumes. Electronically Signed   By: Maisie Fus  Register   On: 03/28/2020 05:35   DG Chest Port 1 View  Result Date: 03/23/2020 CLINICAL DATA:  Shortness of breath.  COVID positive. EXAM: PORTABLE CHEST 1 VIEW COMPARISON:  03/20/2020 FINDINGS: 0800 hours. Low lung volumes. Peripheral and basilar predominant patchy ground-glass airspace disease is  again noted without substantial interval change. Cardiopericardial silhouette is at upper limits of normal for size. The visualized bony structures of the thorax show no acute abnormality. Telemetry leads overlie the chest. IMPRESSION: Persistent peripheral and basilar predominant patchy ground-glass airspace disease. No substantial change. Electronically Signed   By: Kennith Center M.D.   On: 03/23/2020 08:23   DG Chest Port 1 View  Result Date: 03/20/2020 CLINICAL DATA:  Shortness of breath.  COVID positive. EXAM: PORTABLE CHEST 1 VIEW COMPARISON:  03/16/2020 FINDINGS: 0858 hours. Low volume film. Peripherally predominant patchy airspace disease again noted, minimally improved in the interval. Interval improvement in retrocardiac left base collapse/consolidation. No pleural effusion or evidence of pneumothorax. Cardiopericardial silhouette is at upper limits of normal for size. The visualized bony structures of the thorax show no acute abnormality. Telemetry leads overlie the chest. IMPRESSION: Low volume film with slight interval improvement in bilateral patchy airspace disease. Interval improvement in left basilar aeration. Electronically Signed   By: Kennith Center M.D.   On: 03/20/2020 09:22   DG Chest Port 1 View  Result Date: 03/16/2020 CLINICAL DATA:  Pneumonia.  COVID-19 positive EXAM: PORTABLE CHEST 1 VIEW COMPARISON:  March 12, 2020 FINDINGS: Patchy airspace opacity in the lung base regions as well as in the periphery of each upper and mid lung region appears essentially stable. There is consolidation in the medial lung bases, stable. Heart is upper normal in size with pulmonary vascularity normal. No adenopathy. No bone lesions. IMPRESSION: Widespread airspace opacity persists, likely due to known COVID-19 pneumonia. Superimposed bacterial pneumonia in the bases cannot be excluded. Stable cardiac prominence. No adenopathy evident. Electronically Signed   By: Bretta Bang III M.D.   On:  03/16/2020 08:09   DG Chest Port 1 View  Result Date: 03/08/2020 CLINICAL DATA:  Chest pain, shortness of breath EXAM: PORTABLE CHEST 1 VIEW COMPARISON:  None. FINDINGS: Mild cardiomegaly. Extensive patchy airspace opacities bilaterally with a predominantly peripheral and basilar distribution. No large pleural fluid collection. No pneumothorax. IMPRESSION: Extensive patchy bilateral airspace opacities with a predominantly peripheral and basilar distribution. Findings suspicious for multifocal atypical/viral pneumonia. Electronically Signed   By: Duanne Guess D.O.   On: 03/08/2020 11:53   DG Chest Port 1V same Day  Result Date: 03/12/2020 CLINICAL DATA:  Hypoxia. Worsening shortness of breath and cough. COVID-19 positive. EXAM: PORTABLE CHEST 1 VIEW COMPARISON:  Chest radiograph and CTA 03/08/2020 FINDINGS: The cardiomediastinal silhouette is unchanged and accentuated by portable AP technique and mildly low lung volumes. Extensive peripheral and basilar opacities in both lungs have not significantly changed. No large pleural effusion or pneumothorax is identified. No acute osseous abnormality is seen. IMPRESSION: Unchanged COVID-19 pneumonia. Electronically Signed   By: Sebastian Ache  M.D.   On: 03/12/2020 11:49   DG Tibia/Fibula Left Port  Result Date: 03/24/2020 CLINICAL DATA:  fall Fall, left hip pain EXAM: PORTABLE LEFT TIBIA AND FIBULA - 2 VIEW COMPARISON:  None. FINDINGS: No fracture or bone lesion. Hip and knee joints are normally aligned. 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 Soft tissues are unremarkable. IMPRESSION: No fracture or acute finding Electronically Signed   By: Amie Portland M.D.   On: 03/24/2020 13:41   DG HIP UNILAT W OR W/O PELVIS MIN 4 VIEWS LEFT  Result Date: 03/24/2020 CLINICAL DATA:  Fall, left hip pain EXAM: DG HIP (WITH OR WITHOUT PELVIS) 4+V LEFT COMPARISON:  None. FINDINGS: No fracture or bone lesion. Hip joints are  normally spaced and aligned as are the SI joints and symphysis pubis. Soft tissues are unremarkable. IMPRESSION: Negative. Electronically Signed   By: Amie Portland M.D.   On: 03/24/2020 13:42   VAS [place] LOWER EXTREMITY VENOUS (DVT)  Result Date: 03/22/2020  Lower Venous DVT Study Indications: Elevated Ddimer.  Risk Factors: COVID 19 positive. Comparison Study: No prior studies. Performing Technologist: Chanda Busing RVT  Examination Guidelines: A complete evaluation includes B-mode imaging, spectral Doppler, color Doppler, and power Doppler as needed of all accessible portions of each vessel. Bilateral testing is considered an integral part of a complete examination. Limited examinations for reoccurring indications may be performed as noted. The reflux portion of the exam is performed with the patient in reverse Trendelenburg.  +---------+---------------+---------+-----------+----------+--------------+ RIGHT    CompressibilityPhasicitySpontaneityPropertiesThrombus Aging +---------+---------------+---------+-----------+----------+--------------+ CFV      Full           Yes      Yes                                 +---------+---------------+---------+-----------+----------+--------------+ SFJ      Full                                                        +---------+---------------+---------+-----------+----------+--------------+ FV Prox  Full                                                        +---------+---------------+---------+-----------+----------+--------------+ FV Mid   Full                                                        +---------+---------------+---------+-----------+----------+--------------+ FV DistalFull                                                        +---------+---------------+---------+-----------+----------+--------------+ PFV      Full                                                         +---------+---------------+---------+-----------+----------+--------------+  POP      Full           Yes      Yes                                 +---------+---------------+---------+-----------+----------+--------------+ PTV      Full                                                        +---------+---------------+---------+-----------+----------+--------------+ PERO     Full                                                        +---------+---------------+---------+-----------+----------+--------------+   +---------+---------------+---------+-----------+----------+--------------+ LEFT     CompressibilityPhasicitySpontaneityPropertiesThrombus Aging +---------+---------------+---------+-----------+----------+--------------+ CFV      Full           Yes      Yes                                 +---------+---------------+---------+-----------+----------+--------------+ SFJ      Full                                                        +---------+---------------+---------+-----------+----------+--------------+ FV Prox  Full                                                        +---------+---------------+---------+-----------+----------+--------------+ FV Mid   Full                                                        +---------+---------------+---------+-----------+----------+--------------+ FV DistalFull                                                        +---------+---------------+---------+-----------+----------+--------------+ PFV      Full                                                        +---------+---------------+---------+-----------+----------+--------------+ POP      Full           Yes      Yes                                 +---------+---------------+---------+-----------+----------+--------------+  PTV      Full                                                         +---------+---------------+---------+-----------+----------+--------------+ PERO     Full                                                        +---------+---------------+---------+-----------+----------+--------------+     Summary: RIGHT: - There is no evidence of deep vein thrombosis in the lower extremity.  - No cystic structure found in the popliteal fossa.  LEFT: - There is no evidence of deep vein thrombosis in the lower extremity.  - No cystic structure found in the popliteal fossa.  *See table(s) above for measurements and observations. Electronically signed by Gretta Began MD on 03/22/2020 at 3:08:20 PM.    Final    VAS [place] LOWER EXTREMITY VENOUS (DVT)  Result Date: 03/12/2020  Lower Venous DVTStudy Indications: Swelling, and D-dimer.  Comparison Study: No prior studies. Performing Technologist: Jean Rosenthal  Examination Guidelines: A complete evaluation includes B-mode imaging, spectral Doppler, color Doppler, and power Doppler as needed of all accessible portions of each vessel. Bilateral testing is considered an integral part of a complete examination. Limited examinations for reoccurring indications may be performed as noted. The reflux portion of the exam is performed with the patient in reverse Trendelenburg.  +---------+---------------+---------+-----------+----------+--------------+ RIGHT    CompressibilityPhasicitySpontaneityPropertiesThrombus Aging +---------+---------------+---------+-----------+----------+--------------+ CFV      Full           Yes      Yes                                 +---------+---------------+---------+-----------+----------+--------------+ SFJ      Full                                                        +---------+---------------+---------+-----------+----------+--------------+ FV Prox  Full                                                        +---------+---------------+---------+-----------+----------+--------------+ FV Mid    Full                                                        +---------+---------------+---------+-----------+----------+--------------+ FV DistalFull                                                        +---------+---------------+---------+-----------+----------+--------------+  PFV      Full                                                        +---------+---------------+---------+-----------+----------+--------------+ POP      Full           Yes      Yes                                 +---------+---------------+---------+-----------+----------+--------------+ PTV      Full                                                        +---------+---------------+---------+-----------+----------+--------------+ PERO     Full                                                        +---------+---------------+---------+-----------+----------+--------------+   +---------+---------------+---------+-----------+----------+--------------+ LEFT     CompressibilityPhasicitySpontaneityPropertiesThrombus Aging +---------+---------------+---------+-----------+----------+--------------+ CFV      Full           Yes      Yes                                 +---------+---------------+---------+-----------+----------+--------------+ SFJ      Full                                                        +---------+---------------+---------+-----------+----------+--------------+ FV Prox  Full                                                        +---------+---------------+---------+-----------+----------+--------------+ FV Mid   Full                                                        +---------+---------------+---------+-----------+----------+--------------+ FV DistalFull                                                        +---------+---------------+---------+-----------+----------+--------------+ PFV      Full                                                         +---------+---------------+---------+-----------+----------+--------------+  POP      Full           Yes      Yes                                 +---------+---------------+---------+-----------+----------+--------------+ PTV      Full                                                        +---------+---------------+---------+-----------+----------+--------------+ PERO     Full                                                        +---------+---------------+---------+-----------+----------+--------------+     Summary: RIGHT: - There is no evidence of deep vein thrombosis in the lower extremity.  - No cystic structure found in the popliteal fossa.  LEFT: - There is no evidence of deep vein thrombosis in the lower extremity.  - No cystic structure found in the popliteal fossa.  *See table(s) above for measurements and observations. Electronically signed by Coral Else MD on 03/12/2020 at 7:42:12 PM.    Final    ECHOCARDIOGRAM LIMITED  Result Date: 03/24/2020    ECHOCARDIOGRAM REPORT   Patient Name:   Tyler Jackson Date of Exam: 03/24/2020 Medical Rec #:  [id number]     Height:       71.0 in Accession #:    [id number]    Weight:       205.2 lb Date of Birth:  1950-06-26      BSA:          2.132 m Patient Age:    69 years      BP:           127/74 mmHg Patient Gender: M             HR:           99 bpm. Exam Location:  Inpatient Procedure: Limited Echo, Cardiac Doppler, Color Doppler and Intracardiac            Opacification Agent Indications:    CHF-Acute Diastolic  History:        Patient has no prior history of Echocardiogram examinations.                 Risk Factors:Former Smoker. COVID-19.  Sonographer:    Ross Ludwig RDCS (AE) Referring Phys: [id number] [facility]  Sonographer Comments: Technically difficult study due to poor echo windows, suboptimal parasternal window and suboptimal apical window. COVID-19. IMPRESSIONS  1. Left ventricular ejection fraction, by  estimation, is 60 to 65%. The left ventricle has normal function. The left ventricle has no regional wall motion abnormalities. There is moderate left ventricular hypertrophy. Left ventricular diastolic parameters are indeterminate.  2. Right ventricular systolic function is normal. The right ventricular size is normal. Tricuspid regurgitation signal is inadequate for assessing PA pressure.  3. The mitral valve is normal in structure. No evidence of mitral valve regurgitation. No evidence of mitral stenosis.  4. The aortic valve is tricuspid. Aortic valve regurgitation is not visualized. No aortic stenosis is  present.  5. The inferior vena cava is normal in size with greater than 50% respiratory variability, suggesting right atrial pressure of 3 mmHg. FINDINGS  Left Ventricle: Left ventricular ejection fraction, by estimation, is 60 to 65%. The left ventricle has normal function. The left ventricle has no regional wall motion abnormalities. Definity contrast agent was given IV to delineate the left ventricular  endocardial borders. The left ventricular internal cavity size was normal in size. There is moderate left ventricular hypertrophy. Left ventricular diastolic parameters are indeterminate. Right Ventricle: The right ventricular size is normal. Right vetricular wall thickness was not well visualized. Right ventricular systolic function is normal. Tricuspid regurgitation signal is inadequate for assessing PA pressure. Left Atrium: Left atrial size was normal in size. Right Atrium: Right atrial size was normal in size. Pericardium: There is no evidence of pericardial effusion. Mitral Valve: The mitral valve is normal in structure. No evidence of mitral valve regurgitation. No evidence of mitral valve stenosis. Tricuspid Valve: The tricuspid valve is normal in structure. Tricuspid valve regurgitation is trivial. No evidence of tricuspid stenosis. Aortic Valve: The aortic valve is tricuspid. Aortic valve  regurgitation is not visualized. No aortic stenosis is present. Pulmonic Valve: The pulmonic valve was not well visualized. Pulmonic valve regurgitation is not visualized. No evidence of pulmonic stenosis. Aorta: The aortic root is normal in size and structure and the ascending aorta was not well visualized. Venous: The inferior vena cava is normal in size with greater than 50% respiratory variability, suggesting right atrial pressure of 3 mmHg. IAS/Shunts: No atrial level shunt detected by color flow Doppler.  LEFT VENTRICLE PLAX 2D LVIDd:         4.00 cm  Diastology LVIDs:         2.80 cm  LV e' medial:    7.40 cm/s LV PW:         1.50 cm  LV E/e' medial:  7.0 LV IVS:        1.40 cm  LV e' lateral:   7.40 cm/s LVOT diam:     2.30 cm  LV E/e' lateral: 7.0 LVOT Area:     4.15 cm  IVC IVC diam: 1.10 cm LEFT ATRIUM         Index LA diam:    4.40 cm 2.06 cm/m   AORTA Ao Root diam: 3.30 cm Ao Asc diam:  3.90 cm MITRAL VALVE MV Area (PHT): 2.84 cm    SHUNTS MV Decel Time: 267 msec    Systemic Diam: 2.30 cm MV E velocity: 51.90 cm/s MV A velocity: 78.10 cm/s MV E/A ratio:  0.66 Weston Brass MD Electronically signed by Weston Brass MD Signature Date/Time: 03/24/2020/4:28:07 PM    Final     Micro Results     No results found for this or any previous visit (from the past 240 hour(s)).     Today   Subjective:   Tyler Jackson today has no headache,no chest abdominal pain, he denies any dyspnea today , no dizziness, no lightheadedness .  Objective:   Blood pressure 108/76, pulse 100, temperature 98 F (36.7 C), temperature source Oral, resp. rate 16, height 5\' 11"  (1.803 m), weight 93.1 kg, SpO2 97 %.   Intake/Output Summary (Last 24 hours) at 04/02/2020 1037 Last data filed at 04/01/2020 1850 Gross per 24 hour  Intake 262.1 ml  Output 400 ml  Net -137.9 ml    Exam Awake Alert, Oriented x 3, No new F.N deficits, Normal affect Symmetrical Chest  wall movement, Good air movement  bilaterally, CTAB RRR,No Gallops,Rubs or new Murmurs, No Parasternal Heave +ve B.Sounds, Abd Soft, Non tender, No rebound -guarding or rigidity. No Cyanosis, Clubbing or edema, No new Rash or bruise  Data Review   CBC w Diff:  Lab Results  Component Value Date   WBC 8.9 04/02/2020   HGB 9.7 (L) 04/02/2020   HCT 30.6 (L) 04/02/2020   PLT 249 04/02/2020   LYMPHOPCT 10 03/28/2020   MONOPCT 5 03/28/2020   EOSPCT 1 03/28/2020   BASOPCT 0 03/28/2020    CMP:  Lab Results  Component Value Date   NA 138 04/02/2020   K 3.7 04/02/2020   CL 105 04/02/2020   CO2 27 04/02/2020   BUN 5 (L) 04/02/2020   CREATININE 0.61 04/02/2020   PROT 5.1 (L) 04/02/2020   ALBUMIN 2.1 (L) 04/02/2020   BILITOT 0.4 04/02/2020   ALKPHOS 55 04/02/2020   AST 22 04/02/2020   ALT 48 (H) 04/02/2020  .   Total Time in preparing paper work, data evaluation and todays exam - 35 minutes  Huey Bienenstockawood Jahkari Maclin M.D on 04/02/2020 at 10:37 AM  [facility]   Office  [phone]

## 2020-04-02 NOTE — Discharge Instructions (Signed)
Follow with SNF physician  Get CBC, CMP,  In 3 days  Activity: As tolerated with Full fall precautions use walker/cane & assistance as needed   Disposition SNF   Diet: Heart Healthy , with feeding assistance and aspiration precautions.   On your next visit with your primary care physician please Get Medicines reviewed and adjusted.   Please request your Prim.MD to go over all Hospital Tests and Procedure/Radiological results at the follow up, please get all Hospital records sent to your Prim MD by signing hospital release before you go home.   If you experience worsening of your admission symptoms, develop shortness of breath, life threatening emergency, suicidal or homicidal thoughts you must seek medical attention immediately by calling 911 or calling your MD immediately  if symptoms less severe.  You Must read complete instructions/literature along with all the possible adverse reactions/side effects for all the Medicines you take and that have been prescribed to you. Take any new Medicines after you have completely understood and accpet all the possible adverse reactions/side effects.   Do not drive, operating heavy machinery, perform activities at heights, swimming or participation in water activities or provide baby sitting services if your were admitted for syncope or siezures until you have seen by Primary MD or a Neurologist and advised to do so again.  Do not drive when taking Pain medications.    Do not take more than prescribed Pain, Sleep and Anxiety Medications  Special Instructions: If you have smoked or chewed Tobacco  in the last 2 yrs please stop smoking, stop any regular Alcohol  and or any Recreational drug use.  Wear Seat belts while driving.   Please note  You were cared for by a hospitalist during your hospital stay. If you have any questions about your discharge medications or the care you received while you were in the hospital after you are discharged,  you can call the unit and asked to speak with the hospitalist on call if the hospitalist that took care of you is not available. Once you are discharged, your primary care physician will handle any further medical issues. Please note that NO REFILLS for any discharge medications will be authorized once you are discharged, as it is imperative that you return to your primary care physician (or establish a relationship with a primary care physician if you do not have one) for your aftercare needs so that they can reassess your need for medications and monitor your lab values.

## 2020-04-02 NOTE — TOC Transition Note (Signed)
Transition of Care [facility]) - CM/SW Discharge Note   Patient Details  Name: Rogerio Boutelle MRN: [id number] Date of Birth: 1951/05/19  Transition of Care [facility]) CM/SW Contact:  Carmina Miller, LCSWA Phone Number: 04/02/2020, 11:25 AM   Clinical Narrative:    Patient will DC to: [facility] Anticipated DC date: 04/02/20 Family notified: Canary Brim Transport by: Sharin Mons   Per MD patient ready for DC to [facility]. RN to call report prior to discharge [phone], room 128A. RN, patient, patient's family, and facility notified of DC. Discharge Summary and FL2 sent to facility. DC packet on chart. Ambulance transport requested for patient.   CSW will sign off for now as social work intervention is no longer needed. Please consult [place] again if new needs arise.  Final next level of care: Skilled Nursing Facility Barriers to Discharge: Barriers Resolved   Patient Goals and CMS Choice Patient states their goals for this hospitalization and ongoing recovery are:: Get better soon CMS Medicare.gov Compare Post Acute Care list provided to:: Patient Choice offered to / list presented to : Patient  Discharge Placement PASRR number recieved: 03/28/20            Patient [facility] bed at: [facility] Patient to be transferred to facility by: [facility] Name of family member notified: Canary Brim Patient and family notified of of transfer: 04/02/20  Discharge Plan and Services In-house Referral: Clinical Social Work Discharge Planning Services: CM Consult Post Acute Care Choice: Skilled Nursing Facility                    HH Arranged: NA          Social Determinants of Health (SDOH) Interventions     Readmission Risk Interventions No flowsheet data found.

## 2020-04-05 ENCOUNTER — Ambulatory Visit: Payer: Self-pay

## 2020-10-25 IMAGING — DX DG TIBIA/FIBULA PORT 2V*L*
4 series · 4 of 4 positions shown · non-contrast
Comparison: None.

CLINICAL DATA: fall Fall, left hip pain

EXAM:
PORTABLE LEFT TIBIA AND FIBULA - 2 VIEW

[tibia ap (1 of 2)]
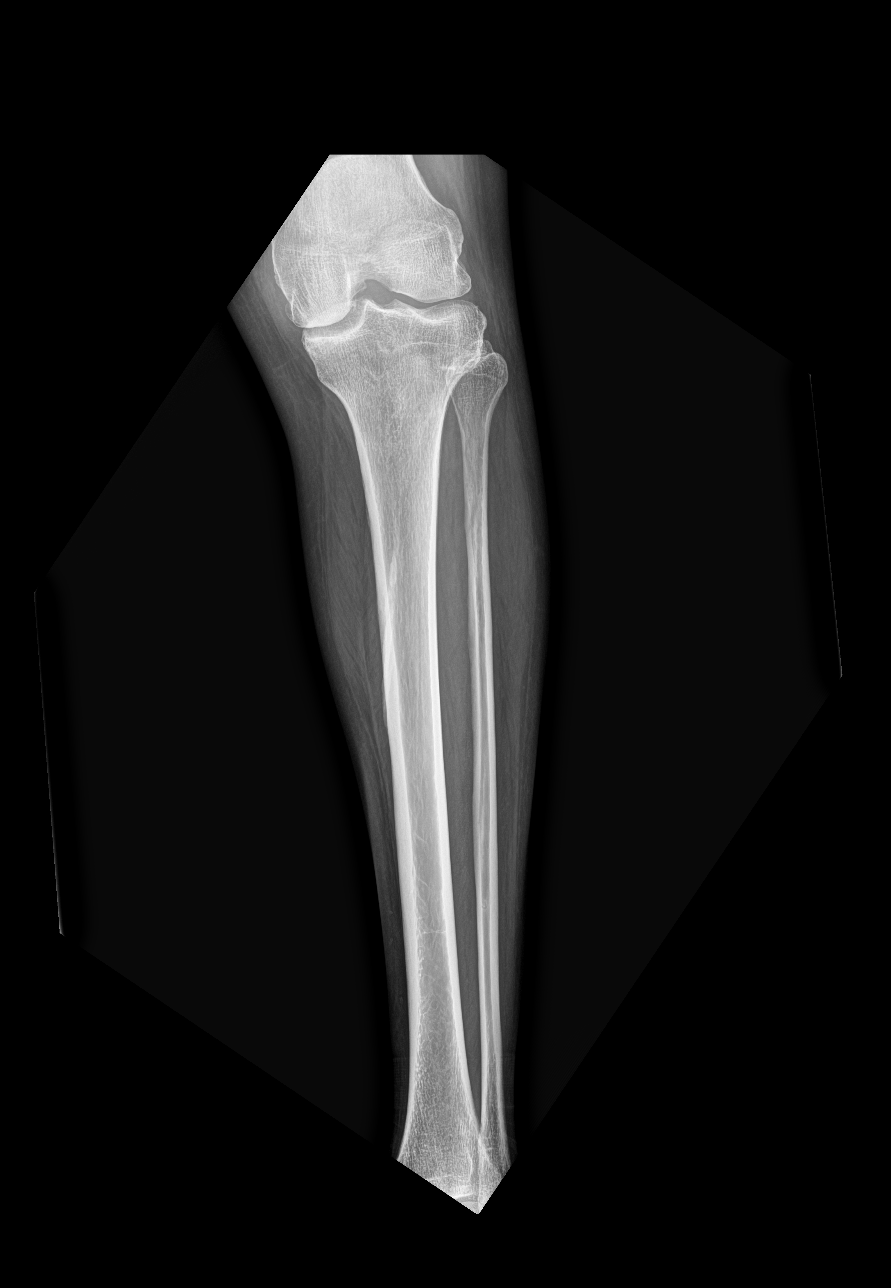

[tibia lat (1 of 2)]
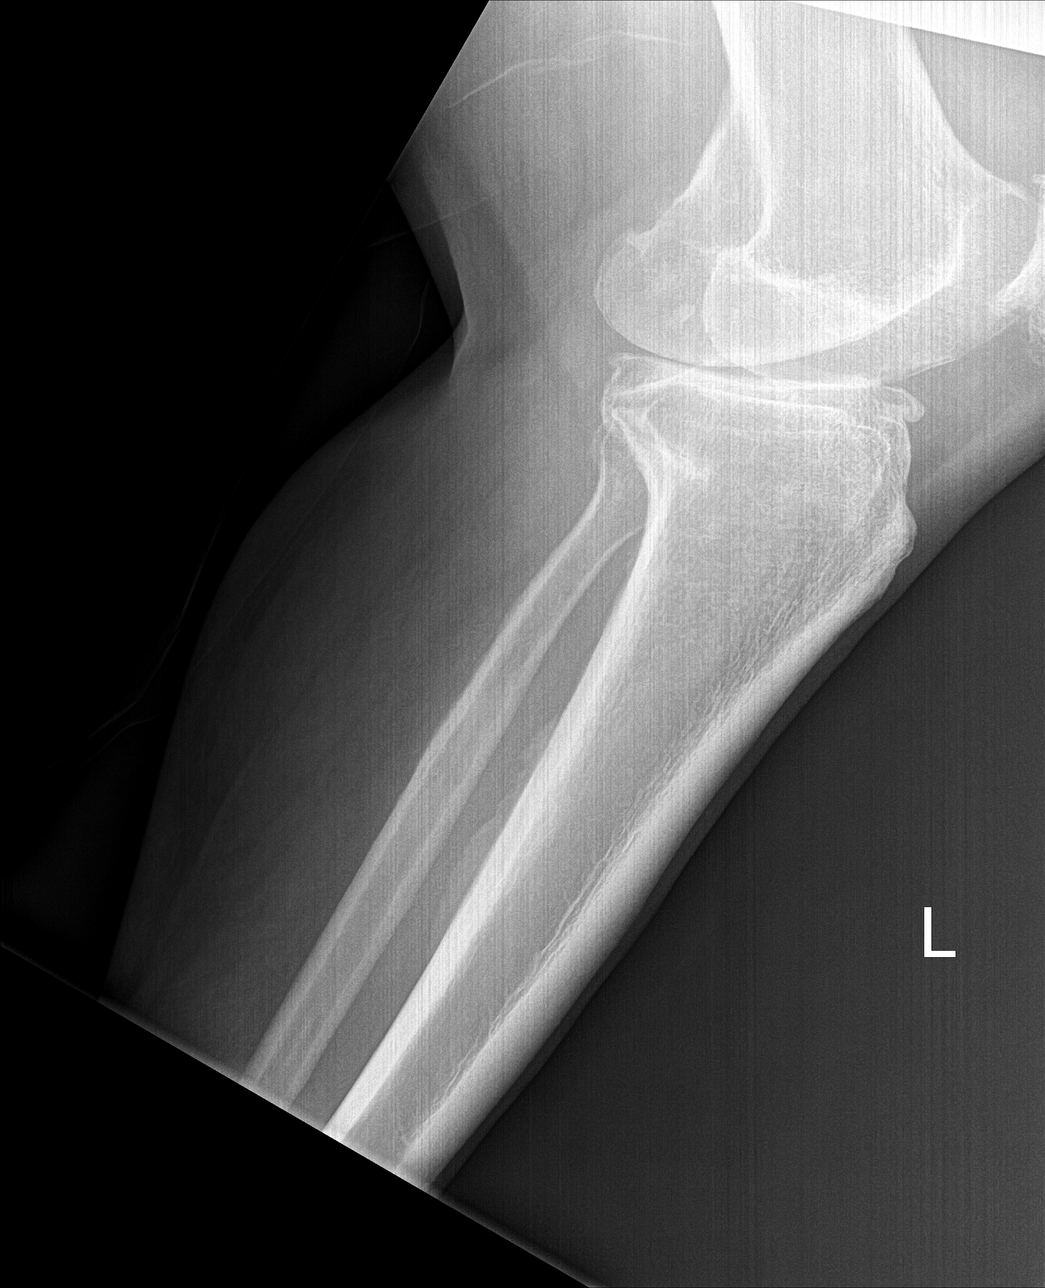

[tibia ap (2 of 2)]
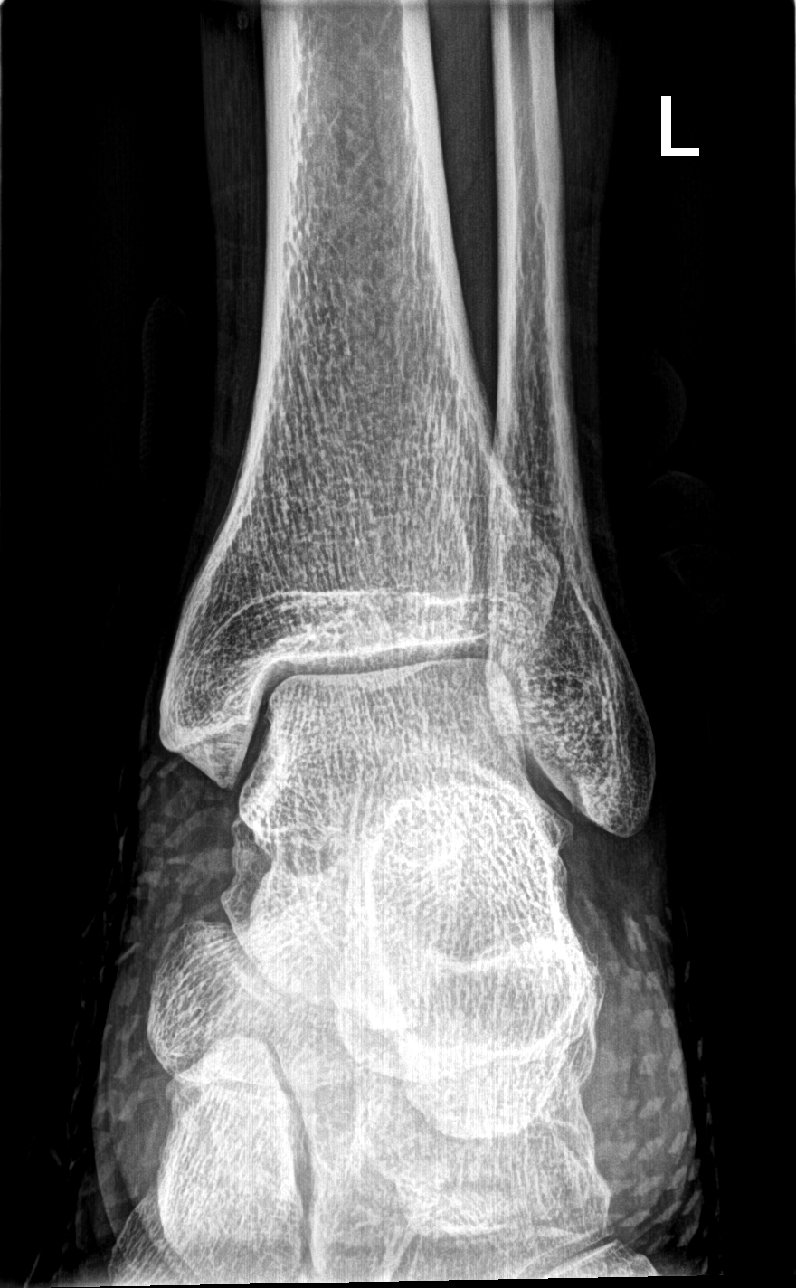

[tibia lat (2 of 2)]
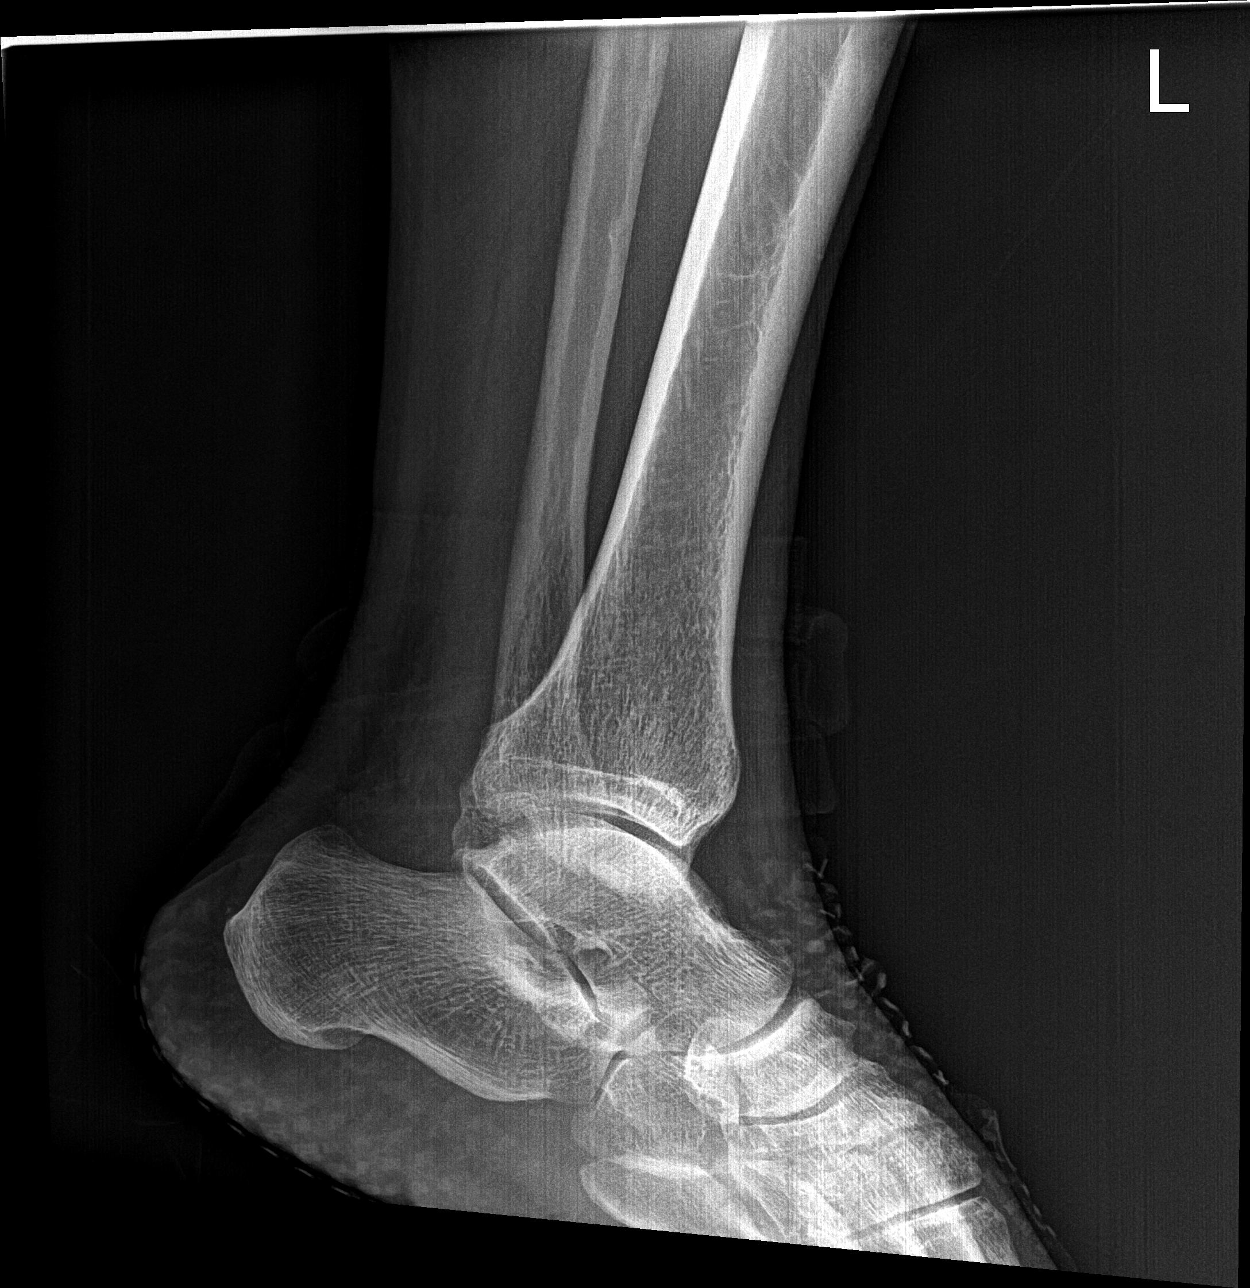

[4 of 4 positions shown; findings below may reference images not displayed]

FINDINGS: No fracture or bone lesion.

Hip and knee joints are normally aligned. There are mild
osteoarthritic changes at the knee mostly of the medial compartment.

Soft tissues are unremarkable.
IMPRESSION: No fracture or acute finding

## 2020-10-29 IMAGING — DX DG CHEST 1V PORT
1 series · 1 of 1 positions shown · non-contrast
Comparison: 03/23/2020.

CLINICAL DATA: Respiratory failure due to [id number].

EXAM:
PORTABLE CHEST 1 VIEW

[chest ap]
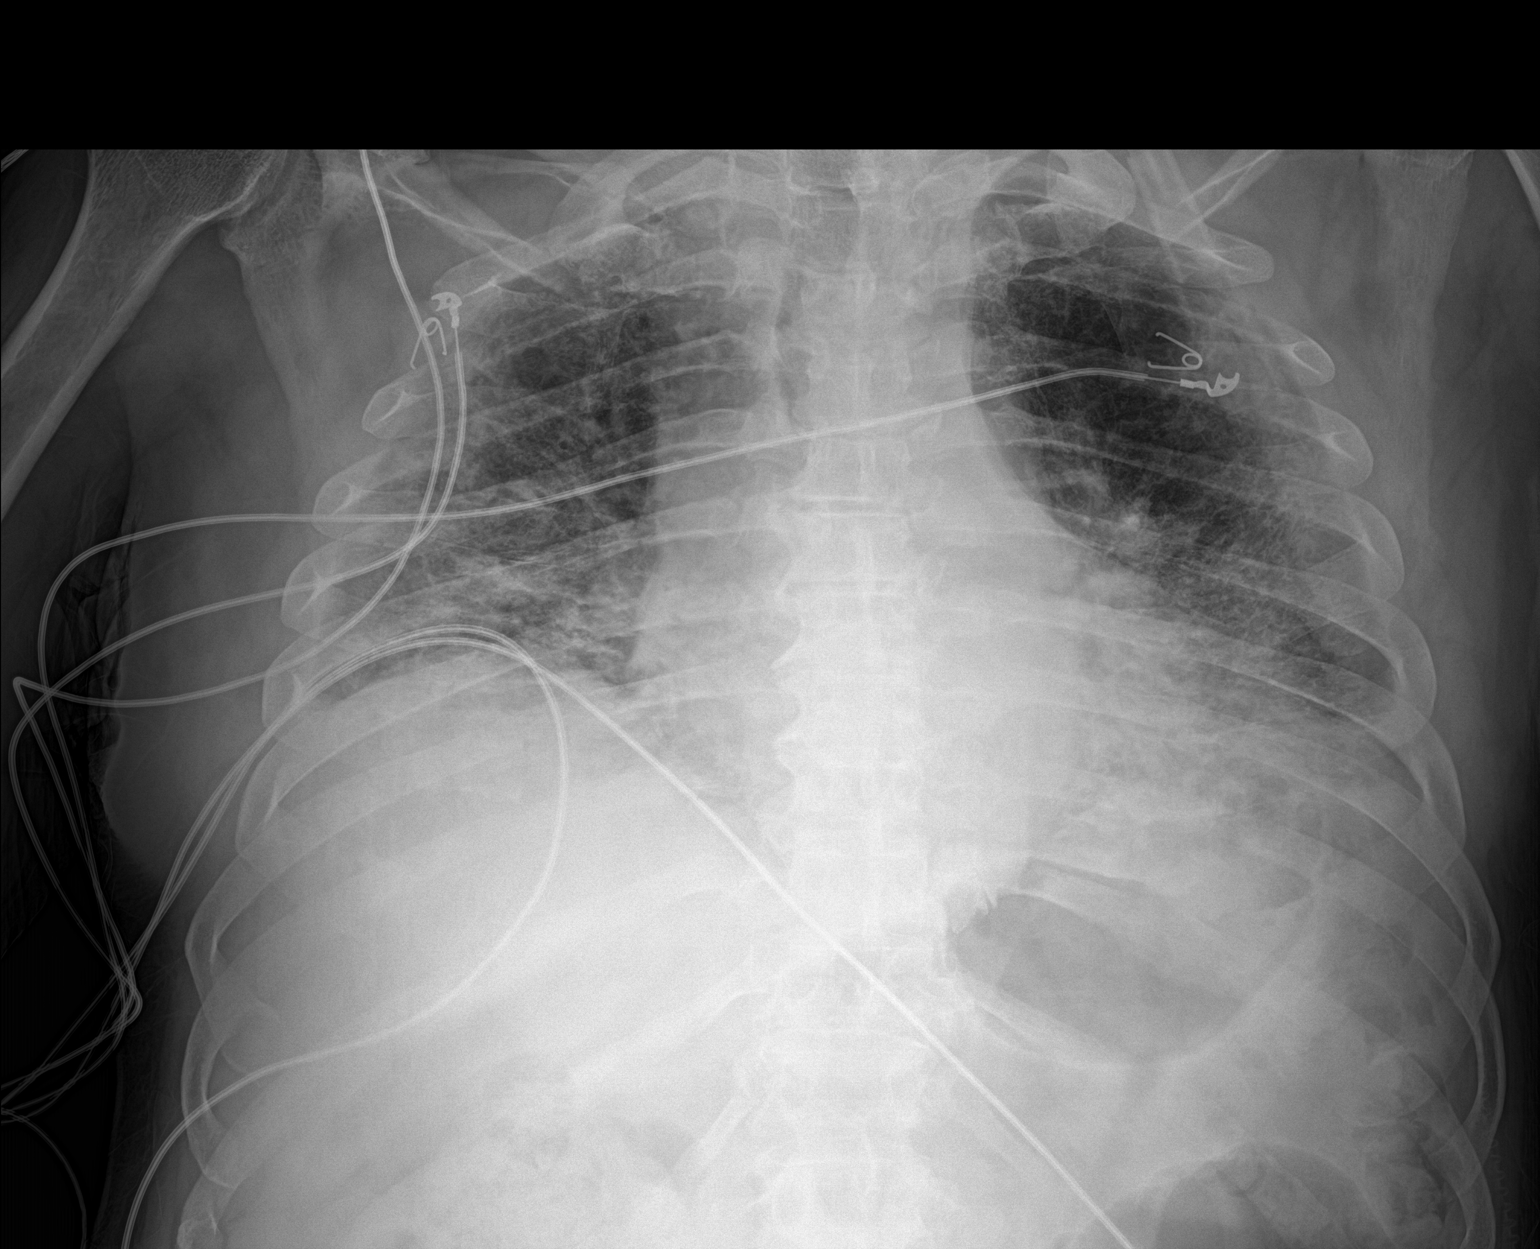

[1 of 1 positions shown; findings below may reference images not displayed]

FINDINGS: Mediastinum and hilar structures stable. Heart size stable. Diffuse
bilateral pulmonary infiltrates again noted without interim change.
Low lung volumes. No pleural effusion or pneumothorax.
IMPRESSION: Diffuse bilateral pulmonary infiltrates again noted without interim
change. Low lung volumes.
